# Patient Record
Sex: Female | Born: 1945
Health system: Southern US, Community
[De-identification: ages and names within clinical notes are randomized; demographics above are authoritative.]

## PROBLEM LIST (undated history)

## (undated) DIAGNOSIS — Z87442 Personal history of urinary calculi: Secondary | ICD-10-CM

## (undated) DIAGNOSIS — D649 Anemia, unspecified: Secondary | ICD-10-CM

## (undated) DIAGNOSIS — I739 Peripheral vascular disease, unspecified: Secondary | ICD-10-CM

## (undated) DIAGNOSIS — N189 Chronic kidney disease, unspecified: Secondary | ICD-10-CM

## (undated) DIAGNOSIS — M1990 Unspecified osteoarthritis, unspecified site: Secondary | ICD-10-CM

## (undated) DIAGNOSIS — E109 Type 1 diabetes mellitus without complications: Secondary | ICD-10-CM

## (undated) DIAGNOSIS — I10 Essential (primary) hypertension: Secondary | ICD-10-CM

## (undated) HISTORY — PX: ABDOMINAL HYSTERECTOMY: SHX81

## (undated) HISTORY — PX: HYSTERECTOMY ABDOMINAL WITH SALPINGO-OOPHORECTOMY: SHX6792

## (undated) HISTORY — PX: COLONOSCOPY: SHX174

## (undated) HISTORY — DX: Type 1 diabetes mellitus without complications: E10.9

---

## 2019-12-02 ENCOUNTER — Ambulatory Visit (INDEPENDENT_AMBULATORY_CARE_PROVIDER_SITE_OTHER): Payer: Medicare HMO | Admitting: Internal Medicine

## 2019-12-02 ENCOUNTER — Encounter: Payer: Self-pay | Admitting: Internal Medicine

## 2019-12-02 ENCOUNTER — Other Ambulatory Visit: Payer: Self-pay

## 2019-12-02 VITALS — BP 180/100 | HR 94 | Temp 97.3°F | Wt 155.2 lb

## 2019-12-02 DIAGNOSIS — N6315 Unspecified lump in the right breast, overlapping quadrants: Secondary | ICD-10-CM | POA: Diagnosis not present

## 2019-12-02 DIAGNOSIS — E109 Type 1 diabetes mellitus without complications: Secondary | ICD-10-CM | POA: Insufficient documentation

## 2019-12-02 DIAGNOSIS — R030 Elevated blood-pressure reading, without diagnosis of hypertension: Secondary | ICD-10-CM

## 2019-12-02 LAB — POCT GLYCOSYLATED HEMOGLOBIN (HGB A1C): Hemoglobin A1C: 13.4 % — AB (ref 4.0–5.6)

## 2019-12-02 MED ORDER — FREESTYLE LIBRE 14 DAY READER DEVI: 1.0000 | 2 refills | Status: DC

## 2019-12-02 NOTE — Progress Notes (Signed)
New Patient Office Visit     This visit occurred during the SARS-CoV-2 public health emergency.  Safety protocols were in place, including screening questions prior to the visit, additional usage of staff PPE, and extensive cleaning of exam room while observing appropriate contact time as indicated for disinfecting solutions.    CC/Reason for Visit: Establish care, discuss chronic conditions Previous PCP: [place] Last Visit: 7 months ago  HPI: Dana [name] is a 73 y.o. female who is coming in today for the above mentioned reasons. Past Medical History is significant for: Insulin-dependent diabetes on 40 units of Tresiba 20 units of NovoLog 3 times daily with meals.  She felt that her most recent A1c was about 7 months ago she has allergies to NSAIDs which cause swelling and clindamycin which causes rash.  Her past surgical history is a hysterectomy, she is a never smoker no alcohol, no family history very significant for diabetes with a mother and 4 siblings.  She wants me to look at her right breast lump that she has.  Past Medical/Surgical History: Past Medical History:  Diagnosis Date  . DM (diabetes mellitus), type 1 ([place])     Past Surgical History:  Procedure Laterality Date  . HYSTERECTOMY ABDOMINAL WITH SALPINGO-OOPHORECTOMY      Social History:  reports that she has never smoked. She has never used smokeless tobacco. She reports that she does not drink alcohol or use drugs.  Allergies: Allergies  Allergen Reactions  . Clindamycin/Lincomycin   . Motrin [Ibuprofen]     Family History:  Family History  Problem Relation Age of Onset  . Diabetes Mother      Current Outpatient Medications:  .  insulin aspart (NOVOLOG FLEXPEN) 100 UNIT/ML FlexPen, Inject into the skin 3 (three) times daily with meals., Disp: , Rfl:  .  Insulin Degludec (TRESIBA FLEXTOUCH) 200 UNIT/ML SOPN, Inject 60 Units into the skin daily., Disp: , Rfl:  .  Continuous Blood Gluc  Receiver (FREESTYLE LIBRE 14 DAY READER) DEVI, 1 Device by Does not apply route every 14 (fourteen) days., Disp: 12 each, Rfl: 2  Review of Systems:  Constitutional: Denies fever, chills, diaphoresis, appetite change and fatigue.  HEENT: Denies photophobia, eye pain, redness, hearing loss, ear pain, congestion, sore throat, rhinorrhea, sneezing, mouth sores, trouble swallowing, neck pain, neck stiffness and tinnitus.   Respiratory: Denies SOB, DOE, cough, chest tightness,  and wheezing.   Cardiovascular: Denies chest pain, palpitations and leg swelling.  Gastrointestinal: Denies nausea, vomiting, abdominal pain, diarrhea, constipation, blood in stool and abdominal distention.  Genitourinary: Denies dysuria, urgency, frequency, hematuria, flank pain and difficulty urinating.  Endocrine: Denies: hot or cold intolerance, sweats, changes in hair or nails, polyuria, polydipsia. Musculoskeletal: Denies myalgias, back pain, joint swelling, arthralgias and gait problem.  Skin: Denies pallor, rash and wound.  Neurological: Denies dizziness, seizures, syncope, weakness, light-headedness, numbness and headaches.  Hematological: Denies adenopathy. Easy bruising, personal or family bleeding history  Psychiatric/Behavioral: Denies suicidal ideation, mood changes, confusion, nervousness, sleep disturbance and agitation    Physical Exam: Vitals:   12/02/19 1416  BP: (!) 180/100  Pulse: 94  Temp: (!) 97.3 F (36.3 C)  TempSrc: Temporal  SpO2: 98%  Weight: 155 lb 3.2 oz (70.4 kg)   There is no height or weight on file to calculate BMI.  Constitutional: NAD, calm, comfortable Eyes: PERRL, lids and conjunctivae normal ENMT: Mucous membranes are moist.  Respiratory: clear to auscultation bilaterally, no wheezing, no crackles. Normal respiratory effort. No accessory  muscle use.  Cardiovascular: Regular rate and rhythm, no murmurs / rubs / gallops. No extremity edema. 2+ pedal pulses.  Abdomen: no  tenderness, no masses palpated. No hepatosplenomegaly. Bowel sounds positive.  Musculoskeletal: no clubbing / cyanosis. No joint deformity upper and lower extremities. Good ROM, no contractures. Normal muscle tone.  Skin: no rashes, lesions, ulcers. No induration Neurologic: Grossly intact and nonfocal Psychiatric: Normal judgment and insight. Alert and oriented x 3. Normal mood.    Impression and Plan:  Elevated BP without diagnosis of hypertension -Blood pressure in office today is 180/100, she does not have a history of hypertension. -Have advised ambulatory blood pressure monitoring and she will return in 8 weeks for follow-up.  Type 1 diabetes mellitus without complication (HCC)  -123456 today is 13.4. -We will increase Tresiba from 48 to 60 units, continue NovoLog 3 times daily with meals 20 units. -She will check CBGs 3 times a day before breakfast, before lunch, before dinner and will come back in 8 weeks for follow-up. -She knows what to do in case of hypoglycemic episodes. -She is requesting a prescription for a glucometer and a freestyle libre. -She needs ophthalmology and podiatry referrals placed.  Right breast lump -We will send for diagnostic mammogram.  She states it has been a few years since her last one.    Patient Instructions  -Nice seeing you today!!  -increase Tresiba to 60 units daily.  -Follow up in 8 weeks with blood pressure and CBG logs.       Lelon Frohlich, MD [facility]

## 2019-12-02 NOTE — Patient Instructions (Signed)
-  Nice seeing you today!!  -increase Tresiba to 60 units daily.  -Follow up in 8 weeks with blood pressure and CBG logs.

## 2019-12-02 NOTE — Addendum Note (Signed)
Addended by: Rebecca Eaton on: 12/02/2019 03:19 PM   Modules accepted: Orders

## 2019-12-09 ENCOUNTER — Encounter: Payer: Self-pay | Admitting: Podiatry

## 2019-12-09 ENCOUNTER — Ambulatory Visit: Payer: Medicare HMO | Admitting: Podiatry

## 2019-12-09 ENCOUNTER — Encounter (HOSPITAL_COMMUNITY): Payer: Self-pay | Admitting: Emergency Medicine

## 2019-12-09 ENCOUNTER — Ambulatory Visit (HOSPITAL_COMMUNITY)
Admission: EM | Admit: 2019-12-09 | Discharge: 2019-12-09 | Disposition: A | Discharge status: Other Acute Inpatient Hospital | Payer: Medicare HMO | Source: Home / Self Care

## 2019-12-09 ENCOUNTER — Other Ambulatory Visit: Payer: Self-pay

## 2019-12-09 ENCOUNTER — Emergency Department (HOSPITAL_COMMUNITY)
Admission: EM | Admit: 2019-12-09 | Discharge: 2019-12-09 | Disposition: A | Discharge status: Home / Self Care | Payer: Medicare HMO | Attending: Emergency Medicine | Admitting: Emergency Medicine

## 2019-12-09 ENCOUNTER — Encounter (HOSPITAL_COMMUNITY): Payer: Self-pay

## 2019-12-09 VITALS — BP 190/90 | HR 95

## 2019-12-09 DIAGNOSIS — R030 Elevated blood-pressure reading, without diagnosis of hypertension: Secondary | ICD-10-CM

## 2019-12-09 DIAGNOSIS — E1165 Type 2 diabetes mellitus with hyperglycemia: Secondary | ICD-10-CM

## 2019-12-09 DIAGNOSIS — M2041 Other hammer toe(s) (acquired), right foot: Secondary | ICD-10-CM | POA: Diagnosis not present

## 2019-12-09 DIAGNOSIS — M2042 Other hammer toe(s) (acquired), left foot: Secondary | ICD-10-CM

## 2019-12-09 DIAGNOSIS — M79675 Pain in left toe(s): Secondary | ICD-10-CM | POA: Diagnosis not present

## 2019-12-09 DIAGNOSIS — M79674 Pain in right toe(s): Secondary | ICD-10-CM | POA: Diagnosis not present

## 2019-12-09 DIAGNOSIS — Z5321 Procedure and treatment not carried out due to patient leaving prior to being seen by health care provider: Secondary | ICD-10-CM | POA: Diagnosis not present

## 2019-12-09 DIAGNOSIS — L84 Corns and callosities: Secondary | ICD-10-CM

## 2019-12-09 DIAGNOSIS — I10 Essential (primary) hypertension: Secondary | ICD-10-CM | POA: Diagnosis present

## 2019-12-09 DIAGNOSIS — B351 Tinea unguium: Secondary | ICD-10-CM | POA: Diagnosis not present

## 2019-12-09 DIAGNOSIS — R079 Chest pain, unspecified: Secondary | ICD-10-CM

## 2019-12-09 DIAGNOSIS — M542 Cervicalgia: Secondary | ICD-10-CM

## 2019-12-09 DIAGNOSIS — R0789 Other chest pain: Secondary | ICD-10-CM | POA: Diagnosis not present

## 2019-12-09 DIAGNOSIS — E1065 Type 1 diabetes mellitus with hyperglycemia: Secondary | ICD-10-CM

## 2019-12-09 LAB — BASIC METABOLIC PANEL
Anion gap: 10 (ref 5–15)
BUN: 27 mg/dL — ABNORMAL HIGH (ref 8–23)
CO2: 25 mmol/L (ref 22–32)
Calcium: 9.3 mg/dL (ref 8.9–10.3)
Chloride: 99 mmol/L (ref 98–111)
Creatinine, Ser: 1.04 mg/dL — ABNORMAL HIGH (ref 0.44–1.00)
GFR calc Af Amer: >60 mL/min (ref >60)
GFR calc non Af Amer: 53 mL/min — ABNORMAL LOW (ref >60)
Glucose, Bld: 334 mg/dL — ABNORMAL HIGH (ref 70–99)
Potassium: 4.3 mmol/L (ref 3.5–5.1)
Sodium: 134 mmol/L — ABNORMAL LOW (ref 135–145)

## 2019-12-09 LAB — CBC
HCT: 34.8 % — ABNORMAL LOW (ref 36.0–46.0)
Hemoglobin: 11.6 g/dL — ABNORMAL LOW (ref 12.0–15.0)
MCH: 30.1 pg (ref 26.0–34.0)
MCHC: 33.3 g/dL (ref 30.0–36.0)
MCV: 90.4 fL (ref 80.0–100.0)
Platelets: 241 10*3/uL (ref 150–400)
RBC: 3.85 MIL/uL — ABNORMAL LOW (ref 3.87–5.11)
RDW: 12.3 % (ref 11.5–15.5)
WBC: 6.2 10*3/uL (ref 4.0–10.5)
nRBC: 0 % (ref 0.0–0.2)

## 2019-12-09 LAB — CBG MONITORING, ED: Glucose-Capillary: 295 mg/dL — ABNORMAL HIGH (ref 70–99)

## 2019-12-09 LAB — TROPONIN I (HIGH SENSITIVITY): Troponin I (High Sensitivity): 8 ng/L (ref <18)

## 2019-12-09 NOTE — ED Triage Notes (Signed)
Patient presents to Urgent Care with complaints of htn since several weeks ago. Patient reports she went to the foot doctor earlier today and had the following readings:  203/115, 190/95, 162/97 Patient states she has been having pain in her left "carotid artery" area.

## 2019-12-09 NOTE — Discharge Instructions (Signed)
Our nursing staff will transport you to the ER immediately to make sure you are not having a heart event especially since you have uncontrolled diabetes.

## 2019-12-09 NOTE — ED Notes (Signed)
Patient is being discharged from the [facility] and sent to the Emergency Department via wheelchair by staff. Per Provider Jaynee Eagles, patient is stable but in need of higher level of care due to hypertension/cardiac. Patient is aware and verbalizes understanding of plan of care.   Vitals:   12/09/19 1511  BP: (!) 165/94  Pulse: 81  Resp: 16  Temp: 98.3 F (36.8 C)  SpO2: 98%

## 2019-12-09 NOTE — ED Notes (Signed)
Pt stated she is leaving, will come back if she needs to.

## 2019-12-09 NOTE — ED Provider Notes (Signed)
[facility]   MRN: [id number] DOB: 1946/01/14  Subjective:   Dana [name] is a 73 y.o. female presenting for 1 week hx of acute onset worsening moderate intermittent (as bad as 8/10), sharp stabbing pain. There is not active chest pain in the exam room but she does state that she had the chest pain radiating into the left side of her neck.  She was not her podiatrist earlier today and had blood pressure readings into the 123456 systolic was advised to go to the emergency room.  Patient came here however.  Patient also has uncontrolled diabetes, last A1c was greater than 13% this month.  No current facility-administered medications for this encounter.  Current Outpatient Medications:  .  Continuous Blood Gluc Receiver (FREESTYLE LIBRE 14 DAY READER) DEVI, 1 Device by Does not apply route every 14 (fourteen) days., Disp: 12 each, Rfl: 2 .  insulin aspart (NOVOLOG FLEXPEN) 100 UNIT/ML FlexPen, Inject into the skin 3 (three) times daily with meals., Disp: , Rfl:  .  Insulin Degludec (TRESIBA FLEXTOUCH) 200 UNIT/ML SOPN, Inject 60 Units into the skin daily., Disp: , Rfl:    Allergies  Allergen Reactions  . Clindamycin/Lincomycin   . Motrin [Ibuprofen]     Past Medical History:  Diagnosis Date  . DM (diabetes mellitus), type 1 ([place])      Past Surgical History:  Procedure Laterality Date  . HYSTERECTOMY ABDOMINAL WITH SALPINGO-OOPHORECTOMY      Family History  Problem Relation Age of Onset  . Diabetes Mother   . Hypertension Father   . Diabetes Sister   . Diabetes Brother   . Diabetes Sister   . Hypertension Sister   . Diabetes Brother   . Diabetes Brother     Social History   Tobacco Use  . Smoking status: Never Smoker  . Smokeless tobacco: Never Used  Substance Use Topics  . Alcohol use: Never  . Drug use: Never    Review of Systems  Constitutional: Positive for malaise/fatigue. Negative for fever.  HENT: Negative for congestion, ear pain, sinus pain and  sore throat.   Eyes: Negative for discharge and redness.  Respiratory: Negative for cough, hemoptysis, shortness of breath and wheezing.   Cardiovascular: Positive for chest pain.  Gastrointestinal: Negative for abdominal pain, diarrhea, nausea and vomiting.  Genitourinary: Negative for dysuria, flank pain and hematuria.  Musculoskeletal: Positive for neck pain. Negative for myalgias.  Skin: Negative for rash.  Neurological: Negative for dizziness, weakness and headaches.  Psychiatric/Behavioral: Negative for depression and substance abuse.     Objective:   Vitals: BP (!) 165/94 (BP Location: Left Arm)   Pulse 81   Temp 98.3 F (36.8 C) (Oral)   Resp 16   SpO2 98%   Physical Exam Constitutional:      General: She is not in acute distress.    Appearance: Normal appearance. She is well-developed. She is not ill-appearing, toxic-appearing or diaphoretic.  HENT:     Head: Normocephalic and atraumatic.     Nose: Nose normal.     Mouth/Throat:     Mouth: Mucous membranes are moist.  Eyes:     Extraocular Movements: Extraocular movements intact.     Pupils: Pupils are equal, round, and reactive to light.  Neck:     Vascular: No carotid bruit.  Cardiovascular:     Rate and Rhythm: Normal rate and regular rhythm.     Pulses: Normal pulses.     Heart sounds: Normal heart sounds. No murmur. No  friction rub. No gallop.   Pulmonary:     Effort: Pulmonary effort is normal. No respiratory distress.     Breath sounds: Normal breath sounds. No stridor. No wheezing, rhonchi or rales.  Abdominal:     General: Bowel sounds are normal. There is no distension.     Palpations: Abdomen is soft.     Tenderness: There is no abdominal tenderness. There is no guarding or rebound.  Musculoskeletal:     Cervical back: Normal range of motion and neck supple. No rigidity or tenderness.  Lymphadenopathy:     Cervical: No cervical adenopathy.  Skin:    General: Skin is warm and dry.     Findings:  No rash.  Neurological:     Mental Status: She is alert and oriented to person, place, and time.     Cranial Nerves: No cranial nerve deficit.     Motor: No weakness.     Deep Tendon Reflexes: Reflexes normal.     Comments: Negative Romberg and pronator drift.  Psychiatric:        Mood and Affect: Mood normal.        Behavior: Behavior normal.        Thought Content: Thought content normal.        Judgment: Judgment normal.     ED ECG REPORT   Date: 12/09/2019  Rate: 78  Rhythm: normal sinus rhythm  QRS Axis: normal  Intervals: normal  ST/T Wave abnormalities: nonspecific T wave changes  Conduction Disutrbances:none  Narrative Interpretation: Nonspecific T wave flattening in lead III.  No previous EKG available for comparison.  Sinus rhythm at 78 bpm.  Old EKG Reviewed: none available  I have personally reviewed the EKG tracing and agree with the computerized printout as noted.   Assessment and Plan :   1. Atypical chest pain   2. Right-sided chest pain   3. Neck pain on left side   4. Elevated blood pressure reading   5. Uncontrolled type 2 diabetes mellitus with hyperglycemia ([place])     Patient is high risk for heart event given her uncontrolled diabetes.  She also has very high risk symptoms and having sharp stabbing chest pain that radiates to the left side of her neck.  Her blood pressure is very high today, denies history of hypertension or having need for taking blood pressure medications previously.  There is no previous EKG for comparison.  Given patient's uncontrolled diabetes despite lack of active chest pain in the exam room, recommend that she report to the emergency room for rule out of ACS.  Patient was agreeable to this, our nursing staff will transport her there immediately.   Jaynee Eagles, PA-C 12/09/19 1624

## 2019-12-09 NOTE — ED Notes (Signed)
Pt stated having pain on left arm, that just start on the time that she is sitting in the lobby

## 2019-12-09 NOTE — Patient Instructions (Signed)
Diabetes Mellitus and Foot Care Foot care is an important part of your health, especially when you have diabetes. Diabetes may cause you to have problems because of poor blood flow (circulation) to your feet and legs, which can cause your skin to:  Become thinner and drier.  Break more easily.  Heal more slowly.  Peel and crack. You may also have nerve damage (neuropathy) in your legs and feet, causing decreased feeling in them. This means that you may not notice minor injuries to your feet that could lead to more serious problems. Noticing and addressing any potential problems early is the best way to prevent future foot problems. How to care for your feet Foot hygiene  Wash your feet daily with warm water and mild soap. Do not use hot water. Then, pat your feet and the areas between your toes until they are completely dry. Do not soak your feet as this can dry your skin.  Trim your toenails straight across. Do not dig under them or around the cuticle. File the edges of your nails with an emery board or nail file.  Apply a moisturizing lotion or petroleum jelly to the skin on your feet and to dry, brittle toenails. Use lotion that does not contain alcohol and is unscented. Do not apply lotion between your toes. Shoes and socks  Wear clean socks or stockings every day. Make sure they are not too tight. Do not wear knee-high stockings since they may decrease blood flow to your legs.  Wear shoes that fit properly and have enough cushioning. Always look in your shoes before you put them on to be sure there are no objects inside.  To break in new shoes, wear them for just a few hours a day. This prevents injuries on your feet. Wounds, scrapes, corns, and calluses  Check your feet daily for blisters, cuts, bruises, sores, and redness. If you cannot see the bottom of your feet, use a mirror or ask someone for help.  Do not cut corns or calluses or try to remove them with medicine.  If you  find a minor scrape, cut, or break in the skin on your feet, keep it and the skin around it clean and dry. You may clean these areas with mild soap and water. Do not clean the area with peroxide, alcohol, or iodine.  If you have a wound, scrape, corn, or callus on your foot, look at it several times a day to make sure it is healing and not infected. Check for: ? Redness, swelling, or pain. ? Fluid or blood. ? Warmth. ? Pus or a bad smell. General instructions  Do not cross your legs. This may decrease blood flow to your feet.  Do not use heating pads or hot water bottles on your feet. They may burn your skin. If you have lost feeling in your feet or legs, you may not know this is happening until it is too late.  Protect your feet from hot and cold by wearing shoes, such as at the beach or on hot pavement.  Schedule a complete foot exam at least once a year (annually) or more often if you have foot problems. If you have foot problems, report any cuts, sores, or bruises to your health care provider immediately. Contact a health care provider if:  You have a medical condition that increases your risk of infection and you have any cuts, sores, or bruises on your feet.  You have an injury that is not   healing.  You have redness on your legs or feet.  You feel burning or tingling in your legs or feet.  You have pain or cramps in your legs and feet.  Your legs or feet are numb.  Your feet always feel cold.  You have pain around a toenail. Get help right away if:  You have a wound, scrape, corn, or callus on your foot and: ? You have pain, swelling, or redness that gets worse. ? You have fluid or blood coming from the wound, scrape, corn, or callus. ? Your wound, scrape, corn, or callus feels warm to the touch. ? You have pus or a bad smell coming from the wound, scrape, corn, or callus. ? You have a fever. ? You have a red line going up your leg. Summary  Check your feet every day  for cuts, sores, red spots, swelling, and blisters.  Moisturize feet and legs daily.  Wear shoes that fit properly and have enough cushioning.  If you have foot problems, report any cuts, sores, or bruises to your health care provider immediately.  Schedule a complete foot exam at least once a year (annually) or more often if you have foot problems. This information is not intended to replace advice given to you by your health care provider. Make sure you discuss any questions you have with your health care provider. Document Released: 12/05/2000 Document Revised: 01/20/2018 Document Reviewed: 01/09/2017 Elsevier Patient Education  2020 [employer]  

## 2019-12-09 NOTE — ED Triage Notes (Signed)
Patient sent down from UC due to elevated blood pressure. Patient went to [facility] earlier today for her diabetic care but was told she could not go home due to BP of 203/115. Patient c/o pain in her left "carotid artery" area along with upper right sided pain, states it is not her chest but points to her chest. Denies any pain at this time.

## 2019-12-10 NOTE — Progress Notes (Signed)
Subjective: Dana [name] presents today referred by Dana [name], Dana Halsted, MD for diabetic foot evaluation.  Patient denies any history of foot wounds.  Patient denies any history of numbness, tingling, burning, pins/needles sensations.  Today, patient c/o of painful, discolored, thick toenails which interfere with daily activities.  Pain is aggravated when wearing enclosed shoe gear.   Mrs. Dana [name] has elevated blood pressure readings during intake and relates she has not been diagnosed with HTN and has not taken any medications. She states she was going to purchase homeopathic medication to bring it down. She also relates one of her brother has had 8 strokes. She denies any acute chest pain in office today. Denies any headache, lightheadedness or dizziness.   Past Medical History:  Diagnosis Date  . DM (diabetes mellitus), type 1 [facility])     Patient Active Problem List   Diagnosis Date Noted  . DM (diabetes mellitus), type 1 ([place])     Past Surgical History:  Procedure Laterality Date  . HYSTERECTOMY ABDOMINAL WITH SALPINGO-OOPHORECTOMY      Current Outpatient Medications on File Prior to Visit  Medication Sig Dispense Refill  . Continuous Blood Gluc Receiver (FREESTYLE LIBRE 14 DAY READER) DEVI 1 Device by Does not apply route every 14 (fourteen) days. 12 each 2  . insulin aspart (NOVOLOG FLEXPEN) 100 UNIT/ML FlexPen Inject into the skin 3 (three) times daily with meals.    . Insulin Degludec (TRESIBA FLEXTOUCH) 200 UNIT/ML SOPN Inject 60 Units into the skin daily.     No current facility-administered medications on file prior to visit.     Allergies  Allergen Reactions  . Clindamycin/Lincomycin   . Motrin [Ibuprofen]     Social History   Occupational History  . Not on file  Tobacco Use  . Smoking status: Never Smoker  . Smokeless tobacco: Never Used  Substance and Sexual Activity  . Alcohol use: Never  . Drug use: Never  . Sexual activity: Not on file     Family History  Problem Relation Age of Onset  . Diabetes Mother   . Hypertension Father   . Diabetes Sister   . Diabetes Brother   . Diabetes Sister   . Hypertension Sister   . Diabetes Brother   . Diabetes Brother     There is no immunization history on file for this patient.  Review of systems: Positive Findings in bold print.  Constitutional:  chills, fatigue, fever, sweats, weight change Communication: Optometrist, sign Ecologist, hand writing, iPad/Android device Head: headaches, head injury Eyes: changes in vision, eye pain, glaucoma, cataracts, macular degeneration, diplopia, glare,  light sensitivity, eyeglasses or contacts, blindness Ears nose mouth throat: hearing impaired, hearing aids,  ringing in ears, deaf, sign language,  vertigo, nosebleeds,  rhinitis,  cold sores, snoring, swollen glands Cardiovascular: HTN, edema, arrhythmia, pacemaker in place, defibrillator in place, chest pain/tightness, chronic anticoagulation, neck discomfort, blood clot, heart failure, MI Peripheral Vascular: leg cramps, varicose veins, blood clots, lymphedema, varicosities Respiratory:  difficulty breathing, denies congestion, SOB, wheezing, cough, emphysema Gastrointestinal: change in appetite or weight, abdominal pain, constipation, diarrhea, nausea, vomiting, vomiting blood, change in bowel habits, abdominal pain, jaundice, rectal bleeding, hemorrhoids, GERD Genitourinary:  nocturia,  pain on urination, polyuria,  blood in urine, Foley catheter, urinary urgency, ESRD on hemodialysis Musculoskeletal: amputation, cramping, stiff joints, painful joints, decreased joint motion, fractures, OA, gout, hemiplegia, paraplegia, uses cane, wheelchair bound, uses walker, uses rollator Skin: +changes in toenails, color change, dryness, itching, mole changes,  rash,  wound(s) Neurological: headaches, numbness in feet, paresthesias in feet, burning in feet, fainting,  seizures, change in  speech,  headaches, memory problems/poor historian, cerebral palsy, weakness, paralysis, CVA, TIA Endocrine: diabetes, hypothyroidism, hyperthyroidism,  goiter, dry mouth, flushing, heat intolerance,  cold intolerance,  excessive thirst, denies polyuria,  nocturia Hematological:  easy bleeding, excessive bleeding, easy bruising, enlarged lymph nodes, on long term blood thinner, history of past transusions Allergy/immunological:  hives, eczema, frequent infections, multiple drug allergies, seasonal allergies, transplant recipient, multiple food allergies Psychiatric:  anxiety, depression, mood disorder, suicidal ideations, hallucinations, insomnia  Objective: 73 y.o. AAF, WD, WN in NAD. Verbal, cooperative. AAO x 3.  Vitals:   12/09/19 1319  BP: (!) 190/90  Pulse: 95   Vascular Examination: Capillary refill time immediate x 10 digits  Dorsalis pedis pulses palpable b/l.  Posterior tibial pulses palpable b/l.  Digital hair sparse x 10 digits.  Skin temperature gradient WNL b/l.  Dermatological Examination: Skin with normal turgor, texture and tone b/l.  Toenails 1-5 b/l discolored, thick, dystrophic with subungual debris and pain with palpation to nailbeds due to thickness of nails.  Hyperkeratotic lesion dorsal 5th PIPJ b/l and submet head 5 b/l. No erythema, no edema, no drainage, no flocculence noted.  Musculoskeletal: Muscle strength 5/5 to all LE muscle groups b/l.  Hammertoe 5th digit b/l  No pain, crepitus or joint limitation with active/passive ROM b/l.  Neurological: Sensation intact 5/5 b/l with 10 gram monofilament.  Vibratory sensation intact b/l.   Assessment: 1. Painful onychomycosis toenails 1-5 b/l  2. Elevated BP without dx of HTN 3. Corns b/l 5th digit 4. Calluses submet head 5 b/l 5. NIDDM  Plan: 1. Discussed concern of her elevated blood pressure readings. Informed her her bp readings were in stroke range. She is not in any acute distress in  office. Recommended ED visit. She agreed to go to urgent care after her visit here.  2. Discussed diabetic foot care principles. Literature dispensed on today. 3. Toenails 1-5 b/l were debrided in length and girth without iatrogenic bleeding. 4. Patient to continue soft, supportive shoe gear 5. Patient to report any pedal injuries to medical professional immediately. 6. Follow up 3 months.  7. Patient/POA to call should there be a concern in the interim.

## 2019-12-18 ENCOUNTER — Other Ambulatory Visit: Payer: Self-pay

## 2019-12-18 ENCOUNTER — Encounter (HOSPITAL_COMMUNITY): Payer: Self-pay | Admitting: Emergency Medicine

## 2019-12-18 ENCOUNTER — Emergency Department (HOSPITAL_COMMUNITY): Payer: Medicare HMO

## 2019-12-18 ENCOUNTER — Emergency Department (HOSPITAL_COMMUNITY)
Admission: EM | Admit: 2019-12-18 | Discharge: 2019-12-18 | Disposition: A | Discharge status: Home / Self Care | Payer: Medicare HMO | Attending: Emergency Medicine | Admitting: Emergency Medicine

## 2019-12-18 DIAGNOSIS — R0789 Other chest pain: Secondary | ICD-10-CM | POA: Diagnosis present

## 2019-12-18 DIAGNOSIS — Z794 Long term (current) use of insulin: Secondary | ICD-10-CM | POA: Diagnosis not present

## 2019-12-18 DIAGNOSIS — E1165 Type 2 diabetes mellitus with hyperglycemia: Secondary | ICD-10-CM | POA: Insufficient documentation

## 2019-12-18 DIAGNOSIS — I10 Essential (primary) hypertension: Secondary | ICD-10-CM | POA: Diagnosis not present

## 2019-12-18 DIAGNOSIS — E119 Type 2 diabetes mellitus without complications: Secondary | ICD-10-CM

## 2019-12-18 DIAGNOSIS — R739 Hyperglycemia, unspecified: Secondary | ICD-10-CM

## 2019-12-18 HISTORY — DX: Essential (primary) hypertension: I10

## 2019-12-18 LAB — BASIC METABOLIC PANEL
Anion gap: 6 (ref 5–15)
BUN: 18 mg/dL (ref 8–23)
CO2: 26 mmol/L (ref 22–32)
Calcium: 9 mg/dL (ref 8.9–10.3)
Chloride: 102 mmol/L (ref 98–111)
Creatinine, Ser: 1.22 mg/dL — ABNORMAL HIGH (ref 0.44–1.00)
GFR calc Af Amer: 51 mL/min — ABNORMAL LOW (ref >60)
GFR calc non Af Amer: 44 mL/min — ABNORMAL LOW (ref >60)
Glucose, Bld: 346 mg/dL — ABNORMAL HIGH (ref 70–99)
Potassium: 4.2 mmol/L (ref 3.5–5.1)
Sodium: 134 mmol/L — ABNORMAL LOW (ref 135–145)

## 2019-12-18 LAB — CBC
HCT: 33.7 % — ABNORMAL LOW (ref 36.0–46.0)
Hemoglobin: 10.9 g/dL — ABNORMAL LOW (ref 12.0–15.0)
MCH: 29.6 pg (ref 26.0–34.0)
MCHC: 32.3 g/dL (ref 30.0–36.0)
MCV: 91.6 fL (ref 80.0–100.0)
Platelets: 217 10*3/uL (ref 150–400)
RBC: 3.68 MIL/uL — ABNORMAL LOW (ref 3.87–5.11)
RDW: 12.2 % (ref 11.5–15.5)
WBC: 6 10*3/uL (ref 4.0–10.5)
nRBC: 0 % (ref 0.0–0.2)

## 2019-12-18 LAB — TROPONIN I (HIGH SENSITIVITY): Troponin I (High Sensitivity): 8 ng/L (ref <18)

## 2019-12-18 MED ORDER — SODIUM CHLORIDE 0.9% FLUSH: 3.0000 mL | Freq: Once | INTRAVENOUS | Status: DC

## 2019-12-18 NOTE — Discharge Instructions (Signed)
Please schedule follow-up appointment with your primary doctor to discuss your high blood pressure, diabetes management.  If you develop any chest pain, difficulty in breathing, abdominal pain, vomiting, other new concerning symptom recommend return to ER for reassessment.

## 2019-12-18 NOTE — ED Triage Notes (Signed)
Pt her by EMS for eval of intermittent chest pain for 1 week and uncontrolled hypertension. Bps have been 123456 systolic for the past week. Pt had right sided chest pain earlier today, she took 324 of Asprin. She has no pain currently.

## 2019-12-18 NOTE — ED Provider Notes (Signed)
[facility] EMERGENCY DEPARTMENT Provider Note   CSN: [id number] Arrival date & time: 12/18/19  1609     History Chief Complaint  Patient presents with  . Chest Pain  . Hypertension    Dana [name] is a 73 y.o. female. Presents to the emergency department with chief complaint of chest discomfort, concern for elevated blood sugar elevated blood pressure. Patient states that she is been having symptoms for at least the past week, chest pain described as a stabbing sensation, brief, lasting for seconds, occurs at rest, no association with exertion, no association with shortness of breath. No cough, no fever. No known history of coronary artery disease. Patient has history of diabetes. States that she is followed by her primary care doctor, on insulin regimen. States that her sugars have been running high lately, 300s to 400s. No nausea, vomiting, abdominal pain. Also states that she has had elevated blood pressures lately, states she used to not have any blood pressure problems. Was noted to be somewhat elevated in her last office visit but was not started on any antihypertensives. Denies any headaches, no numbness, weakness, vision changes, speech changes or ambulation changes.  HPI     Past Medical History:  Diagnosis Date  . DM (diabetes mellitus), type 1 ([place])   . Hypertension     Patient Active Problem List   Diagnosis Date Noted  . DM (diabetes mellitus), type 1 ([place])     Past Surgical History:  Procedure Laterality Date  . HYSTERECTOMY ABDOMINAL WITH SALPINGO-OOPHORECTOMY       OB History   No obstetric history on file.     Family History  Problem Relation Age of Onset  . Diabetes Mother   . Hypertension Father   . Diabetes Sister   . Diabetes Brother   . Diabetes Sister   . Hypertension Sister   . Diabetes Brother   . Diabetes Brother     Social History   Tobacco Use  . Smoking status: Never Smoker  . Smokeless tobacco: Never Used   Substance Use Topics  . Alcohol use: Never  . Drug use: Never    Home Medications Prior to Admission medications   Medication Sig Start Date End Date Taking? Authorizing Provider  Continuous Blood Gluc Receiver (FREESTYLE LIBRE 14 DAY READER) DEVI 1 Device by Does not apply route every 14 (fourteen) days. 12/02/19   Isaac Bliss, Rayford Halsted, MD  insulin aspart (NOVOLOG FLEXPEN) 100 UNIT/ML FlexPen Inject into the skin 3 (three) times daily with meals.    [provider]  Insulin Degludec (TRESIBA FLEXTOUCH) 200 UNIT/ML SOPN Inject 60 Units into the skin daily.    [provider]    Allergies    Clindamycin/lincomycin and Motrin [ibuprofen]  Review of Systems   Review of Systems  Constitutional: Negative for chills and fever.  HENT: Negative for ear pain and sore throat.   Eyes: Negative for pain and visual disturbance.  Respiratory: Negative for cough and shortness of breath.   Cardiovascular: Positive for chest pain. Negative for palpitations.  Gastrointestinal: Negative for abdominal pain and vomiting.  Genitourinary: Negative for dysuria and hematuria.  Musculoskeletal: Negative for arthralgias and back pain.  Skin: Negative for color change and rash.  Neurological: Negative for seizures and syncope.  All other systems reviewed and are negative.   Physical Exam Updated Vital Signs BP (!) 175/91 (BP Location: Right Arm)   Pulse 78   Temp 98.7 F (37.1 C) (Oral)   Resp  16   SpO2 98%   Physical Exam Vitals and nursing note reviewed.  Constitutional:      General: She is not in acute distress.    Appearance: She is well-developed.  HENT:     Head: Normocephalic and atraumatic.  Eyes:     Conjunctiva/sclera: Conjunctivae normal.  Cardiovascular:     Rate and Rhythm: Normal rate and regular rhythm.     Heart sounds: No murmur.  Pulmonary:     Effort: Pulmonary effort is normal. No respiratory distress.     Breath sounds: Normal breath sounds.   Chest:     Chest wall: No mass or deformity.  Abdominal:     Palpations: Abdomen is soft.     Tenderness: There is no abdominal tenderness.  Musculoskeletal:     Cervical back: Neck supple.     Right lower leg: No edema.     Left lower leg: No edema.  Skin:    General: Skin is warm and dry.  Neurological:     Mental Status: She is alert.     ED Results / Procedures / Treatments   Labs (all labs ordered are listed, but only abnormal results are displayed) Labs Reviewed  BASIC METABOLIC PANEL - Abnormal; Notable for the following components:      Result Value   Sodium 134 (*)    Glucose, Bld 346 (*)    Creatinine, Ser 1.22 (*)    GFR calc non Af Amer 44 (*)    GFR calc Af Amer 51 (*)    All other components within normal limits  CBC - Abnormal; Notable for the following components:   RBC 3.68 (*)    Hemoglobin 10.9 (*)    HCT 33.7 (*)    All other components within normal limits  TROPONIN I (HIGH SENSITIVITY)  TROPONIN I (HIGH SENSITIVITY)    EKG EKG Interpretation  Date/Time:  Sunday December 18 2019 16:41:09 EST Ventricular Rate:  75 PR Interval:  158 QRS Duration: 82 QT Interval:  380 QTC Calculation: 424 R Axis:   -15 Text Interpretation: Normal sinus rhythm Moderate voltage criteria for LVH, may be normal variant ( R in aVL , Cornell product ) Nonspecific ST and T wave abnormality Abnormal ECG Confirmed by Juanette Urizar ([id number]) on 12/18/2019 6:40:02 PM   Radiology DG Chest 2 View  Result Date: 12/18/2019 CLINICAL DATA:  73 year old female with chest pain. EXAM: CHEST - 2 VIEW COMPARISON:  None FINDINGS: The lungs are clear. There is no pleural effusion or pneumothorax. Top-normal cardiac size. No acute osseous pathology. IMPRESSION: No active cardiopulmonary disease. Electronically Signed   By: Arash  Radparvar M.D.   On: 12/18/2019 17:26    Procedures Procedures (including critical care time)  Medications Ordered in ED Medications  sodium  chloride flush (NS) 0.9 % injection 3 mL (has no administration in time range)    ED Course  I have reviewed the triage vital signs and the nursing notes.  Pertinent labs & imaging results that were available during my care of the patient were reviewed by me and considered in my medical decision making (see chart for details).    MDM Rules/Calculators/A&P                      73  year old lady past medical history of type 2 diabetes on insulin presents to ER with multiple complaints. Regarding chest pain, she has no ongoing symptoms here, pain described as brief, fleeting sensation, no association with  exertion, her EKG has no acute ischemic changes, troponin within normal limits, very low suspicion for ACS. CXR negative. No hypoxia, no tachypnea, no shortness of breath, doubt PE. Regarding her hyperglycemia, she has known history of type 2 diabetes, noted prior documentation of A1c of 13, she is not in DKA today. Recommend close follow-up with her primary doctor for her insulin management, will defer any changes in her insulin regimen to her primary doctor. Regarding hypertension, no symptoms from this, she is well-appearing, blood pressure without any treatment is 156/86. Again, will defer to her primary doctor for management of her hypertension. Reviewed return precautions in detail with patient, at this time believe she is appropriate for discharge and outpatient management.    After the discussed management above, the patient was determined to be safe for discharge.  The patient was in agreement with this plan and all questions regarding their care were answered.  ED return precautions were discussed and the patient will return to the ED with any significant worsening of condition.   Final Clinical Impression(s) / ED Diagnoses Final diagnoses:  Hypertension, unspecified type  Hyperglycemia  Type 2 diabetes mellitus without complication, with long-term current use of insulin [facility])    Rx  / DC Orders ED Discharge Orders    None       Lucrezia Starch, MD 12/18/19 2102

## 2019-12-18 NOTE — ED Notes (Signed)
Patient Alert and oriented to baseline. Stable and ambulatory to baseline. Patient verbalized understanding of the discharge instructions.  Patient belongings were taken by the patient.   

## 2020-01-12 ENCOUNTER — Telehealth: Payer: Self-pay | Admitting: Internal Medicine

## 2020-01-12 NOTE — Telephone Encounter (Signed)
Attempted to call patient but unable to leave a message.   How many units is patient using?

## 2020-01-12 NOTE — Telephone Encounter (Signed)
MEDICATION: insulin aspart (NOVOLOG FLEXPEN) 100 UNIT/ML FlexPen Insulin Degludec (TRESIBA FLEXTOUCH) 200 UNIT/ML SOPN  PHARMACY: [id number] - [place], [place] - [address] AT [place] Phone:  [phone]  Fax:  [phone]

## 2020-01-13 MED ORDER — NOVOLOG FLEXPEN 100 UNIT/ML ~~LOC~~ SOPN: 25.0000 [IU] | PEN_INJECTOR | Freq: Three times a day (TID) | SUBCUTANEOUS | 0 refills | Status: DC

## 2020-01-13 MED ORDER — TRESIBA FLEXTOUCH 200 UNIT/ML ~~LOC~~ SOPN: 60.0000 [IU] | PEN_INJECTOR | Freq: Every day | SUBCUTANEOUS | 0 refills | Status: DC

## 2020-01-13 NOTE — Addendum Note (Signed)
Addended by: Westley Hummer B on: 01/13/2020 03:23 PM   Modules accepted: Orders

## 2020-01-13 NOTE — Telephone Encounter (Signed)
Spoke with patient and refill sent.  Patient also requested an appointment for hypertension.  Appointment scheduled

## 2020-01-17 ENCOUNTER — Telehealth: Payer: Self-pay | Admitting: Internal Medicine

## 2020-01-17 ENCOUNTER — Other Ambulatory Visit: Payer: Self-pay

## 2020-01-18 ENCOUNTER — Encounter: Payer: Self-pay | Admitting: Internal Medicine

## 2020-01-18 ENCOUNTER — Ambulatory Visit (INDEPENDENT_AMBULATORY_CARE_PROVIDER_SITE_OTHER): Payer: Medicare HMO | Admitting: Internal Medicine

## 2020-01-18 VITALS — BP 190/100 | HR 91 | Temp 97.1°F | Wt 151.5 lb

## 2020-01-18 DIAGNOSIS — I10 Essential (primary) hypertension: Secondary | ICD-10-CM | POA: Diagnosis not present

## 2020-01-18 DIAGNOSIS — E1159 Type 2 diabetes mellitus with other circulatory complications: Secondary | ICD-10-CM

## 2020-01-18 MED ORDER — LISINOPRIL 10 MG PO TABS: 10.0000 mg | ORAL_TABLET | Freq: Every day | ORAL | 1 refills | Status: DC

## 2020-01-18 MED ORDER — HYDROCHLOROTHIAZIDE 25 MG PO TABS: 25.0000 mg | ORAL_TABLET | Freq: Every day | ORAL | 1 refills | Status: DC

## 2020-01-18 NOTE — Progress Notes (Signed)
Established Patient Office Visit     This visit occurred during the SARS-CoV-2 public health emergency.  Safety protocols were in place, including screening questions prior to the visit, additional usage of staff PPE, and extensive cleaning of exam room while observing appropriate contact time as indicated for disinfecting solutions.    CC/Reason for Visit: Blood pressure follow-up  HPI: Dana [name] is a 74 y.o. female who is coming in today for the above mentioned reasons. Past Medical History is significant for: Insulin-dependent diabetes.  At her visit in December she was noted to have elevated blood pressure but has never been diagnosed with hypertension.  I asked her to do ambulatory blood pressure monitoring and return today for follow-up.  Since I last saw her she visited the urgent care, had elevated blood pressures there.  She also comes in with a note from her podiatrist where she was noted to have blood pressure in the 123XX123 systolic range.  She denies chest pain, shortness of breath, headache, blurry or double vision, focal neurologic deficit.   Past Medical/Surgical History: Past Medical History:  Diagnosis Date  . DM (diabetes mellitus), type 1 ([place])   . Hypertension     Past Surgical History:  Procedure Laterality Date  . HYSTERECTOMY ABDOMINAL WITH SALPINGO-OOPHORECTOMY      Social History:  reports that she has never smoked. She has never used smokeless tobacco. She reports that she does not drink alcohol or use drugs.  Allergies: Allergies  Allergen Reactions  . Clindamycin/Lincomycin   . Motrin [Ibuprofen]     Family History:  Family History  Problem Relation Age of Onset  . Diabetes Mother   . Hypertension Father   . Diabetes Sister   . Diabetes Brother   . Diabetes Sister   . Hypertension Sister   . Diabetes Brother   . Diabetes Brother      Current Outpatient Medications:  .  Continuous Blood Gluc Receiver (FREESTYLE LIBRE 14 DAY  READER) DEVI, 1 Device by Does not apply route every 14 (fourteen) days., Disp: 12 each, Rfl: 2 .  insulin aspart (NOVOLOG FLEXPEN) 100 UNIT/ML FlexPen, Inject 25 Units into the skin 3 (three) times daily with meals., Disp: 15 mL, Rfl: 0 .  Insulin Degludec (TRESIBA FLEXTOUCH) 200 UNIT/ML SOPN, Inject 60 Units into the skin daily., Disp: 15 mL, Rfl: 0 .  hydrochlorothiazide (HYDRODIURIL) 25 MG tablet, Take 1 tablet (25 mg total) by mouth daily., Disp: 90 tablet, Rfl: 1 .  lisinopril (ZESTRIL) 10 MG tablet, Take 1 tablet (10 mg total) by mouth daily., Disp: 90 tablet, Rfl: 1  Review of Systems:  Constitutional: Denies fever, chills, diaphoresis, appetite change and fatigue.  HEENT: Denies photophobia, eye pain, redness, hearing loss, ear pain, congestion, sore throat, rhinorrhea, sneezing, mouth sores, trouble swallowing, neck pain, neck stiffness and tinnitus.   Respiratory: Denies SOB, DOE, cough, chest tightness,  and wheezing.   Cardiovascular: Denies chest pain, palpitations and leg swelling.  Gastrointestinal: Denies nausea, vomiting, abdominal pain, diarrhea, constipation, blood in stool and abdominal distention.  Genitourinary: Denies dysuria, urgency, frequency, hematuria, flank pain and difficulty urinating.  Endocrine: Denies: hot or cold intolerance, sweats, changes in hair or nails, polyuria, polydipsia. Musculoskeletal: Denies myalgias, back pain, joint swelling, arthralgias and gait problem.  Skin: Denies pallor, rash and wound.  Neurological: Denies dizziness, seizures, syncope, weakness, light-headedness, numbness and headaches.  Hematological: Denies adenopathy. Easy bruising, personal or family bleeding history  Psychiatric/Behavioral: Denies suicidal ideation, mood changes, confusion,  nervousness, sleep disturbance and agitation    Physical Exam: Vitals:   01/18/20 1107  BP: (!) 190/100  Pulse: 91  Temp: (!) 97.1 F (36.2 C)  TempSrc: Temporal  SpO2: 96%  Weight: 151  lb 8 oz (68.7 kg)    There is no height or weight on file to calculate BMI.   Constitutional: NAD, calm, comfortable Eyes: PERRL, lids and conjunctivae normal ENMT: Mucous membranes are moist.  Respiratory: clear to auscultation bilaterally, no wheezing, no crackles. Normal respiratory effort. No accessory muscle use.  Cardiovascular: Regular rate and rhythm, no murmurs / rubs / gallops. No extremity edema. 2+ pedal pulses. Neurologic: Grossly intact and nonfocal Psychiatric: Normal judgment and insight. Alert and oriented x 3. Normal mood.    Impression and Plan:  Hypertension associated with diabetes ([place]) -New diagnosis. -Start hydrochlorothiazide 25 mg, lisinopril 10 mg daily. -6-week blood pressure follow-up, consider basic metabolic profile at that time to follow renal function and electrolytes.   Patient Instructions  -Nice seeing you today!!  -Start HCTZ 25 mg daily and lisinopril 10 mg daily for your blood pressure.  -Schedule follow up in 6 weeks.  -Low salt diet (see below).   DASH Eating Plan DASH stands for "Dietary Approaches to Stop Hypertension." The DASH eating plan is a healthy eating plan that has been shown to reduce high blood pressure (hypertension). It may also reduce your risk for type 2 diabetes, heart disease, and stroke. The DASH eating plan may also help with weight loss. What are tips for following this plan?  General guidelines  Avoid eating more than 2,300 mg (milligrams) of salt (sodium) a day. If you have hypertension, you may need to reduce your sodium intake to 1,500 mg a day.  Limit alcohol intake to no more than 1 drink a day for nonpregnant women and 2 drinks a day for men. One drink equals 12 oz of beer, 5 oz of wine, or 1 oz of hard liquor.  Work with your health care provider to maintain a healthy body weight or to lose weight. Ask what an ideal weight is for you.  Get at least 30 minutes of exercise that causes your heart to beat  faster (aerobic exercise) most days of the week. Activities may include walking, swimming, or biking.  Work with your health care provider or diet and nutrition specialist (dietitian) to adjust your eating plan to your individual calorie needs. Reading food labels   Check food labels for the amount of sodium per serving. Choose foods with less than 5 percent of the Daily Value of sodium. Generally, foods with less than 300 mg of sodium per serving fit into this eating plan.  To find whole grains, look for the word "whole" as the first word in the ingredient list. Shopping  Buy products labeled as "low-sodium" or "no salt added."  Buy fresh foods. Avoid canned foods and premade or frozen meals. Cooking  Avoid adding salt when cooking. Use salt-free seasonings or herbs instead of table salt or sea salt. Check with your health care provider or pharmacist before using salt substitutes.  Do not fry foods. Cook foods using healthy methods such as baking, boiling, grilling, and broiling instead.  Cook with heart-healthy oils, such as olive, canola, soybean, or sunflower oil. Meal planning  Eat a balanced diet that includes: ? 5 or more servings of fruits and vegetables each day. At each meal, try to fill half of your plate with fruits and vegetables. ? Up to  6-8 servings of whole grains each day. ? Less than 6 oz of lean meat, poultry, or fish each day. A 3-oz serving of meat is about the same size as a deck of cards. One egg equals 1 oz. ? 2 servings of low-fat dairy each day. ? A serving of nuts, seeds, or beans 5 times each week. ? Heart-healthy fats. Healthy fats called Omega-3 fatty acids are found in foods such as flaxseeds and coldwater fish, like sardines, salmon, and mackerel.  Limit how much you eat of the following: ? Canned or prepackaged foods. ? Food that is high in trans fat, such as fried foods. ? Food that is high in saturated fat, such as fatty meat. ? Sweets, desserts,  sugary drinks, and other foods with added sugar. ? Full-fat dairy products.  Do not salt foods before eating.  Try to eat at least 2 vegetarian meals each week.  Eat more home-cooked food and less restaurant, buffet, and fast food.  When eating at a restaurant, ask that your food be prepared with less salt or no salt, if possible. What foods are recommended? The items listed may not be a complete list. Talk with your dietitian about what dietary choices are best for you. Grains Whole-grain or whole-wheat bread. Whole-grain or whole-wheat pasta. Brown rice. [place]. Bulgur. Whole-grain and low-sodium cereals. Pita bread. Low-fat, low-sodium crackers. Whole-wheat flour tortillas. Vegetables Fresh or frozen vegetables (raw, steamed, roasted, or grilled). Low-sodium or reduced-sodium tomato and vegetable juice. Low-sodium or reduced-sodium tomato sauce and tomato paste. Low-sodium or reduced-sodium canned vegetables. Fruits All fresh, dried, or frozen fruit. Canned fruit in natural juice (without added sugar). Meat and other protein foods Skinless chicken or [place]. Ground chicken or [place]. Pork with fat trimmed off. Fish and seafood. Egg whites. Dried beans, peas, or lentils. Unsalted nuts, nut butters, and seeds. Unsalted canned beans. Lean cuts of beef with fat trimmed off. Low-sodium, lean deli meat. Dairy Low-fat (1%) or fat-free (skim) milk. Fat-free, low-fat, or reduced-fat cheeses. Nonfat, low-sodium ricotta or cottage cheese. Low-fat or nonfat yogurt. Low-fat, low-sodium cheese. Fats and oils Soft margarine without trans fats. Vegetable oil. Low-fat, reduced-fat, or light mayonnaise and salad dressings (reduced-sodium). Canola, safflower, olive, soybean, and sunflower oils. Avocado. Seasoning and other foods Herbs. Spices. Seasoning mixes without salt. Unsalted popcorn and pretzels. Fat-free sweets. What foods are not recommended? The items listed may not be a complete list.  Talk with your dietitian about what dietary choices are best for you. Grains Baked goods made with fat, such as croissants, muffins, or some breads. Dry pasta or rice meal packs. Vegetables Creamed or fried vegetables. Vegetables in a cheese sauce. Regular canned vegetables (not low-sodium or reduced-sodium). Regular canned tomato sauce and paste (not low-sodium or reduced-sodium). Regular tomato and vegetable juice (not low-sodium or reduced-sodium). Angie Fava. Olives. Fruits Canned fruit in a light or heavy syrup. Fried fruit. Fruit in cream or butter sauce. Meat and other protein foods Fatty cuts of meat. Ribs. Fried meat. Berniece Salines. Sausage. Bologna and other processed lunch meats. Salami. Fatback. Hotdogs. Bratwurst. Salted nuts and seeds. Canned beans with added salt. Canned or smoked fish. Whole eggs or egg yolks. Chicken or [place] with skin. Dairy Whole or 2% milk, cream, and half-and-half. Whole or full-fat cream cheese. Whole-fat or sweetened yogurt. Full-fat cheese. Nondairy creamers. Whipped toppings. Processed cheese and cheese spreads. Fats and oils Butter. Stick margarine. Lard. Shortening. Ghee. Bacon fat. Tropical oils, such as coconut, palm kernel, or palm oil. Seasoning and  other foods Salted popcorn and pretzels. Onion salt, garlic salt, seasoned salt, table salt, and sea salt. Worcestershire sauce. Tartar sauce. Barbecue sauce. Teriyaki sauce. Soy sauce, including reduced-sodium. Steak sauce. Canned and packaged gravies. Fish sauce. Oyster sauce. Cocktail sauce. Horseradish that you find on the shelf. Ketchup. Mustard. Meat flavorings and tenderizers. Bouillon cubes. Hot sauce and Tabasco sauce. Premade or packaged marinades. Premade or packaged taco seasonings. Relishes. Regular salad dressings. Where to find more information:  National Heart, Lung, and [place]: https://wilson-eaton.com/  American Heart Association: www.heart.org Summary  The DASH eating plan is a healthy eating  plan that has been shown to reduce high blood pressure (hypertension). It may also reduce your risk for type 2 diabetes, heart disease, and stroke.  With the DASH eating plan, you should limit salt (sodium) intake to 2,300 mg a day. If you have hypertension, you may need to reduce your sodium intake to 1,500 mg a day.  When on the DASH eating plan, aim to eat more fresh fruits and vegetables, whole grains, lean proteins, low-fat dairy, and heart-healthy fats.  Work with your health care provider or diet and nutrition specialist (dietitian) to adjust your eating plan to your individual calorie needs. This information is not intended to replace advice given to you by your health care provider. Make sure you discuss any questions you have with your health care provider. Document Revised: 11/20/2017 Document Reviewed: 12/01/2016 Elsevier Patient Education  2020 [place], [place] [facility]

## 2020-01-18 NOTE — Patient Instructions (Signed)
-Nice seeing you today!!  -Start HCTZ 25 mg daily and lisinopril 10 mg daily for your blood pressure.  -Schedule follow up in 6 weeks.  -Low salt diet (see below).   DASH Eating Plan DASH stands for "Dietary Approaches to Stop Hypertension." The DASH eating plan is a healthy eating plan that has been shown to reduce high blood pressure (hypertension). It may also reduce your risk for type 2 diabetes, heart disease, and stroke. The DASH eating plan may also help with weight loss. What are tips for following this plan?  General guidelines  Avoid eating more than 2,300 mg (milligrams) of salt (sodium) a day. If you have hypertension, you may need to reduce your sodium intake to 1,500 mg a day.  Limit alcohol intake to no more than 1 drink a day for nonpregnant women and 2 drinks a day for men. One drink equals 12 oz of beer, 5 oz of wine, or 1 oz of hard liquor.  Work with your health care provider to maintain a healthy body weight or to lose weight. Ask what an ideal weight is for you.  Get at least 30 minutes of exercise that causes your heart to beat faster (aerobic exercise) most days of the week. Activities may include walking, swimming, or biking.  Work with your health care provider or diet and nutrition specialist (dietitian) to adjust your eating plan to your individual calorie needs. Reading food labels   Check food labels for the amount of sodium per serving. Choose foods with less than 5 percent of the Daily Value of sodium. Generally, foods with less than 300 mg of sodium per serving fit into this eating plan.  To find whole grains, look for the word "whole" as the first word in the ingredient list. Shopping  Buy products labeled as "low-sodium" or "no salt added."  Buy fresh foods. Avoid canned foods and premade or frozen meals. Cooking  Avoid adding salt when cooking. Use salt-free seasonings or herbs instead of table salt or sea salt. Check with your health care  provider or pharmacist before using salt substitutes.  Do not fry foods. Cook foods using healthy methods such as baking, boiling, grilling, and broiling instead.  Cook with heart-healthy oils, such as olive, canola, soybean, or sunflower oil. Meal planning  Eat a balanced diet that includes: ? 5 or more servings of fruits and vegetables each day. At each meal, try to fill half of your plate with fruits and vegetables. ? Up to 6-8 servings of whole grains each day. ? Less than 6 oz of lean meat, poultry, or fish each day. A 3-oz serving of meat is about the same size as a deck of cards. One egg equals 1 oz. ? 2 servings of low-fat dairy each day. ? A serving of nuts, seeds, or beans 5 times each week. ? Heart-healthy fats. Healthy fats called Omega-3 fatty acids are found in foods such as flaxseeds and coldwater fish, like sardines, salmon, and mackerel.  Limit how much you eat of the following: ? Canned or prepackaged foods. ? Food that is high in trans fat, such as fried foods. ? Food that is high in saturated fat, such as fatty meat. ? Sweets, desserts, sugary drinks, and other foods with added sugar. ? Full-fat dairy products.  Do not salt foods before eating.  Try to eat at least 2 vegetarian meals each week.  Eat more home-cooked food and less restaurant, buffet, and fast food.  When eating at  a restaurant, ask that your food be prepared with less salt or no salt, if possible. What foods are recommended? The items listed may not be a complete list. Talk with your dietitian about what dietary choices are best for you. Grains Whole-grain or whole-wheat bread. Whole-grain or whole-wheat pasta. Brown rice. [place]. Bulgur. Whole-grain and low-sodium cereals. Pita bread. Low-fat, low-sodium crackers. Whole-wheat flour tortillas. Vegetables Fresh or frozen vegetables (raw, steamed, roasted, or grilled). Low-sodium or reduced-sodium tomato and vegetable juice. Low-sodium or  reduced-sodium tomato sauce and tomato paste. Low-sodium or reduced-sodium canned vegetables. Fruits All fresh, dried, or frozen fruit. Canned fruit in natural juice (without added sugar). Meat and other protein foods Skinless chicken or [place]. Ground chicken or [place]. Pork with fat trimmed off. Fish and seafood. Egg whites. Dried beans, peas, or lentils. Unsalted nuts, nut butters, and seeds. Unsalted canned beans. Lean cuts of beef with fat trimmed off. Low-sodium, lean deli meat. Dairy Low-fat (1%) or fat-free (skim) milk. Fat-free, low-fat, or reduced-fat cheeses. Nonfat, low-sodium ricotta or cottage cheese. Low-fat or nonfat yogurt. Low-fat, low-sodium cheese. Fats and oils Soft margarine without trans fats. Vegetable oil. Low-fat, reduced-fat, or light mayonnaise and salad dressings (reduced-sodium). Canola, safflower, olive, soybean, and sunflower oils. Avocado. Seasoning and other foods Herbs. Spices. Seasoning mixes without salt. Unsalted popcorn and pretzels. Fat-free sweets. What foods are not recommended? The items listed may not be a complete list. Talk with your dietitian about what dietary choices are best for you. Grains Baked goods made with fat, such as croissants, muffins, or some breads. Dry pasta or rice meal packs. Vegetables Creamed or fried vegetables. Vegetables in a cheese sauce. Regular canned vegetables (not low-sodium or reduced-sodium). Regular canned tomato sauce and paste (not low-sodium or reduced-sodium). Regular tomato and vegetable juice (not low-sodium or reduced-sodium). Angie Fava. Olives. Fruits Canned fruit in a light or heavy syrup. Fried fruit. Fruit in cream or butter sauce. Meat and other protein foods Fatty cuts of meat. Ribs. Fried meat. Berniece Salines. Sausage. Bologna and other processed lunch meats. Salami. Fatback. Hotdogs. Bratwurst. Salted nuts and seeds. Canned beans with added salt. Canned or smoked fish. Whole eggs or egg yolks. Chicken or [place]  with skin. Dairy Whole or 2% milk, cream, and half-and-half. Whole or full-fat cream cheese. Whole-fat or sweetened yogurt. Full-fat cheese. Nondairy creamers. Whipped toppings. Processed cheese and cheese spreads. Fats and oils Butter. Stick margarine. Lard. Shortening. Ghee. Bacon fat. Tropical oils, such as coconut, palm kernel, or palm oil. Seasoning and other foods Salted popcorn and pretzels. Onion salt, garlic salt, seasoned salt, table salt, and sea salt. Worcestershire sauce. Tartar sauce. Barbecue sauce. Teriyaki sauce. Soy sauce, including reduced-sodium. Steak sauce. Canned and packaged gravies. Fish sauce. Oyster sauce. Cocktail sauce. Horseradish that you find on the shelf. Ketchup. Mustard. Meat flavorings and tenderizers. Bouillon cubes. Hot sauce and Tabasco sauce. Premade or packaged marinades. Premade or packaged taco seasonings. Relishes. Regular salad dressings. Where to find more information:  National Heart, Lung, and [place]: https://wilson-eaton.com/  American Heart Association: www.heart.org Summary  The DASH eating plan is a healthy eating plan that has been shown to reduce high blood pressure (hypertension). It may also reduce your risk for type 2 diabetes, heart disease, and stroke.  With the DASH eating plan, you should limit salt (sodium) intake to 2,300 mg a day. If you have hypertension, you may need to reduce your sodium intake to 1,500 mg a day.  When on the DASH eating plan, aim to eat  more fresh fruits and vegetables, whole grains, lean proteins, low-fat dairy, and heart-healthy fats.  Work with your health care provider or diet and nutrition specialist (dietitian) to adjust your eating plan to your individual calorie needs. This information is not intended to replace advice given to you by your health care provider. Make sure you discuss any questions you have with your health care provider. Document Revised: 11/20/2017 Document Reviewed:  12/01/2016 Elsevier Patient Education  2020 [employer].

## 2020-01-19 ENCOUNTER — Ambulatory Visit: Payer: Medicare HMO | Admitting: Internal Medicine

## 2020-01-23 ENCOUNTER — Other Ambulatory Visit: Payer: Self-pay

## 2020-01-25 ENCOUNTER — Other Ambulatory Visit: Payer: Self-pay

## 2020-01-25 ENCOUNTER — Encounter: Payer: Self-pay | Admitting: Internal Medicine

## 2020-01-25 ENCOUNTER — Ambulatory Visit (INDEPENDENT_AMBULATORY_CARE_PROVIDER_SITE_OTHER): Payer: Medicare HMO | Admitting: Internal Medicine

## 2020-01-25 VITALS — BP 180/80 | HR 80 | Wt 150.0 lb

## 2020-01-25 DIAGNOSIS — Z794 Long term (current) use of insulin: Secondary | ICD-10-CM | POA: Insufficient documentation

## 2020-01-25 DIAGNOSIS — E1022 Type 1 diabetes mellitus with diabetic chronic kidney disease: Secondary | ICD-10-CM

## 2020-01-25 DIAGNOSIS — E1121 Type 2 diabetes mellitus with diabetic nephropathy: Secondary | ICD-10-CM | POA: Diagnosis not present

## 2020-01-25 MED ORDER — OZEMPIC (0.25 OR 0.5 MG/DOSE) 2 MG/1.5ML ~~LOC~~ SOPN: 0.5000 mg | PEN_INJECTOR | SUBCUTANEOUS | 5 refills | Status: DC

## 2020-01-25 MED ORDER — LEVEMIR FLEXTOUCH 100 UNIT/ML ~~LOC~~ SOPN: 60.0000 [IU] | PEN_INJECTOR | Freq: Every day | SUBCUTANEOUS | 5 refills | Status: DC

## 2020-01-25 NOTE — Patient Instructions (Signed)
Please stop: - Tyler Aas   Please try: - Levemir 60 units at bedtime  Please change: - Novolog 15-20 units 3x a day, before meals  Please start: - Ozempic 0.25 mg weekly in a.m. (for example on Sunday morning) x 4 weeks, then increase to 0.5 mg weekly in a.m. if no nausea or hypoglycemia.  Please return in 1.5 months with your sugar log.   PATIENT INSTRUCTIONS FOR TYPE 2 DIABETES:  **Please join MyChart!** - see attached instructions about how to join if you have not done so already.  DIET AND EXERCISE Diet and exercise is an important part of diabetic treatment.  We recommended aerobic exercise in the form of brisk walking (working between 40-60% of maximal aerobic capacity, similar to brisk walking) for 150 minutes per week (such as 30 minutes five days per week) along with 3 times per week performing 'resistance' training (using various gauge rubber tubes with handles) 5-10 exercises involving the major muscle groups (upper body, lower body and core) performing 10-15 repetitions (or near fatigue) each exercise. Start at half the above goal but build slowly to reach the above goals. If limited by weight, joint pain, or disability, we recommend daily walking in a swimming pool with water up to waist to reduce pressure from joints while allow for adequate exercise.    BLOOD GLUCOSES Monitoring your blood glucoses is important for continued management of your diabetes. Please check your blood glucoses 2-4 times a day: fasting, before meals and at bedtime (you can rotate these measurements - e.g. one day check before the 3 meals, the next day check before 2 of the meals and before bedtime, etc.).   HYPOGLYCEMIA (low blood sugar) Hypoglycemia is usually a reaction to not eating, exercising, or taking too much insulin/ other diabetes drugs.  Symptoms include tremors, sweating, hunger, confusion, headache, etc. Treat IMMEDIATELY with 15 grams of Carbs: . 4 glucose tablets .  cup regular  juice/soda . 2 tablespoons raisins . 4 teaspoons sugar . 1 tablespoon honey Recheck blood glucose in 15 mins and repeat above if still symptomatic/blood glucose <100.  RECOMMENDATIONS TO REDUCE YOUR RISK OF DIABETIC COMPLICATIONS: * Take your prescribed MEDICATION(S) * Follow a DIABETIC diet: Complex carbs, fiber rich foods, (monounsaturated and polyunsaturated) fats * AVOID saturated/trans fats, high fat foods, >2,300 mg salt per day. * EXERCISE at least 5 times a week for 30 minutes or preferably daily.  * DO NOT SMOKE OR DRINK more than 1 drink a day. * Check your FEET every day. Do not wear tightfitting shoes. Contact us if you develop an ulcer * See your EYE doctor once a year or more if needed * Get a FLU shot once a year * Get a PNEUMONIA vaccine once before and once after age 26 years  GOALS:  * Your Hemoglobin A1c of <7%  * fasting sugars need to be <130 * after meals sugars need to be <180 (2h after you start eating) * Your Systolic BP should be [id number] or lower  * Your Diastolic BP should be 80 or lower  * Your HDL (Good Cholesterol) should be 40 or higher  * Your LDL (Bad Cholesterol) should be 100 or lower. * Your Triglycerides should be 150 or lower  * Your Urine microalbumin (kidney function) should be <30 * Your Body Mass Index should be 25 or lower    Please consider the following ways to cut down carbs and fat and increase fiber and micronutrients in your diet: - substitute  whole grain for white bread or pasta - substitute brown rice for white rice - substitute 90-calorie flat bread pieces for slices of bread when possible - substitute sweet potatoes or yams for white potatoes - substitute humus for margarine - substitute tofu for cheese when possible - substitute almond or rice milk for regular milk (would not drink soy milk daily due to concern for soy estrogen influence on breast cancer risk) - substitute dark chocolate for other sweets when possible -  substitute water - can add lemon or orange slices for taste - for diet sodas (artificial sweeteners will trick your body that you can eat sweets without getting calories and will lead you to overeating and weight gain in the long run) - do not skip breakfast or other meals (this will slow down the metabolism and will result in more weight gain over time)  - can try smoothies made from fruit and almond/rice milk in am instead of regular breakfast - can also try old-fashioned (not instant) oatmeal made with almond/rice milk in am - order the dressing on the side when eating salad at a restaurant (pour less than half of the dressing on the salad) - eat as little meat as possible - can try juicing, but should not forget that juicing will get rid of the fiber, so would alternate with eating raw veg./fruits or drinking smoothies - use as little oil as possible, even when using olive oil - can dress a salad with a mix of balsamic vinegar and lemon juice, for e.g. - use agave nectar, stevia sugar, or regular sugar rather than artificial sweateners - steam or broil/roast veggies  - snack on veggies/fruit/nuts (unsalted, preferably) when possible, rather than processed foods - reduce or eliminate aspartame in diet (it is in diet sodas, chewing gum, etc) Read the labels!  Try to read Dr. Janene Harvey book: "Program for Reversing Diabetes" for other ideas for healthy eating.

## 2020-01-25 NOTE — Progress Notes (Signed)
Patient ID: Dana [name], female   DOB: [birth year], 74 y.o.   MRN: [id number]   This visit occurred during the SARS-CoV-2 public health emergency.  Safety protocols were in place, including screening questions prior to the visit, additional usage of staff PPE, and extensive cleaning of exam room while observing appropriate contact time as indicated for disinfecting solutions.   HPI: Dana [name] is a 74 y.o.-year-old female, referred by her PCP, Dana [name], for management of DM2, dx in 1999, insulin-dependent since 2013, uncontrolled, with complications (CKD).  Patient recently establish care with Dana [name] and was found to have a very high HbA1c. She recently moved from [place] - Dana [name] [phone].  Reviewed latest HbA1c: Lab Results  Component Value Date   HGBA1C 13.4 (A) 12/02/2019   Pt is on a regimen of: - Tresiba 48 >> 60 units daily -increased recently by PCP - Novolog 20 units 3x a day, before meals She tried Lantus >> not responding well to this.  Pt is not checking sugars now. - am: n/c - 2h after b'fast: n/c - before lunch: n/c - 2h after lunch: n/c - before dinner: n/c - 2h after dinner: n/c - bedtime: n/c - nighttime: n/c Lowest sugar was 140; she has hypoglycemia awareness at 70.  Highest sugar was 280.  Glucometer: none now  Pt's meals are: - Breakfast: special K cereals + 3/4 cup of milk; boiled egg + wholewheat toast; cranberry juice - apple - Lunch: whole wheat tuna salad sandwich - Dinner: baked chicken and salad or veggies - Snacks: see above  -+ CKD, last BUN/creatinine:  Lab Results  Component Value Date   BUN 18 12/18/2019   BUN 27 (H) 12/09/2019   CREATININE 1.22 (H) 12/18/2019   CREATININE 1.04 (H) 12/09/2019  She is not on ACE inhibitor/ARB  - +HL, lipids not available for review: No results found for: CHOL, HDL, LDLCALC, LDLDIRECT, TRIG, CHOLHDL She is not on a statin.  - last eye exam was in 2019 -  [place]. No DR. + cataracts - had 1 sx.  - no numbness and tingling in her feet. Has a podiatrist.  Pt has FH of DM in mother and 4 siblings.  She also has a history of HTN.  She is walking for exercise twice a week  ROS: Constitutional: no weight gain, + weight loss, + increased appetite, no fatigue, no subjective hyperthermia, no subjective hypothermia, no nocturia, + poor sleep Eyes: no blurry vision, no xerophthalmia ENT: no sore throat, no nodules palpated in neck, no dysphagia, no odynophagia, no hoarseness, no tinnitus, no hypoacusis Cardiovascular: + CP, no SOB, no palpitations, no leg swelling Respiratory: no cough, no SOB, no wheezing Gastrointestinal: no N, no V, no D, + C, no acid reflux Musculoskeletal: no muscle, + joint aches Skin: no rash, + hair loss Neurological: no tremors, no numbness or tingling/no dizziness/no HAs Psychiatric: no depression, no anxiety  Past Medical History:  Diagnosis Date  . DM (diabetes mellitus), type 1 ([place])   . Hypertension    Past Surgical History:  Procedure Laterality Date  . HYSTERECTOMY ABDOMINAL WITH SALPINGO-OOPHORECTOMY     Social History   Socioeconomic History  . Marital status: Married    Spouse name: Not on file  . Number of children: 2  . Years of education: Not on file  . Highest education level: Not on file  Occupational History  .  Healthcare  Tobacco Use  . Smoking status: Never Smoker  . Smokeless tobacco:  Never Used  Substance and Sexual Activity  . Alcohol use: Never  . Drug use: Never   Social Determinants of Health   Financial Resource Strain:   . Difficulty of Paying Living Expenses: Not on file  Food Insecurity:   . Worried About Charity fundraiser in the Last Year: Not on file  . Ran Out of Food in the Last Year: Not on file  Transportation Needs:   . Lack of Transportation (Medical): Not on file  . Lack of Transportation (Non-Medical): Not on file  Physical Activity:   . Days of  Exercise per Week: Not on file  . Minutes of Exercise per Session: Not on file  Stress:   . Feeling of Stress : Not on file  Social Connections:   . Frequency of Communication with Friends and Family: Not on file  . Frequency of Social Gatherings with Friends and Family: Not on file  . Attends Religious Services: Not on file  . Active Member of Clubs or Organizations: Not on file  . Attends Archivist Meetings: Not on file  . Marital Status: Not on file  Intimate Partner Violence:   . Fear of Current or Ex-Partner: Not on file  . Emotionally Abused: Not on file  . Physically Abused: Not on file  . Sexually Abused: Not on file   Current Outpatient Medications on File Prior to Visit  Medication Sig Dispense Refill  . Continuous Blood Gluc Receiver (FREESTYLE LIBRE 14 DAY READER) DEVI 1 Device by Does not apply route every 14 (fourteen) days. 12 each 2  . hydrochlorothiazide (HYDRODIURIL) 25 MG tablet Take 1 tablet (25 mg total) by mouth daily. 90 tablet 1  . insulin aspart (NOVOLOG FLEXPEN) 100 UNIT/ML FlexPen Inject 25 Units into the skin 3 (three) times daily with meals. 15 mL 0  . Insulin Degludec (TRESIBA FLEXTOUCH) 200 UNIT/ML SOPN Inject 60 Units into the skin daily. 15 mL 0  . lisinopril (ZESTRIL) 10 MG tablet Take 1 tablet (10 mg total) by mouth daily. 90 tablet 1   No current facility-administered medications on file prior to visit.   Allergies  Allergen Reactions  . Clindamycin/Lincomycin   . Motrin [Ibuprofen]    Family History  Problem Relation Age of Onset  . Diabetes Mother   . Hypertension Father   . Diabetes Sister   . Diabetes Brother   . Diabetes Sister   . Hypertension Sister   . Diabetes Brother   . Diabetes Brother    PE: BP (!) 180/80   Pulse 80   Wt 150 lb (68 kg)   SpO2 95%  Wt Readings from Last 3 Encounters:  01/25/20 150 lb (68 kg)  01/18/20 151 lb 8 oz (68.7 kg)  12/02/19 155 lb 3.2 oz (70.4 kg)   Constitutional: normal weight,  in NAD Eyes: PERRLA, EOMI, no exophthalmos ENT: moist mucous membranes, no thyromegaly, no cervical lymphadenopathy Cardiovascular: RRR, No MRG Respiratory: CTA B Gastrointestinal: abdomen soft, NT, ND, BS+ Musculoskeletal: no deformities, strength intact in all 4 Skin: moist, warm, no rashes Neurological: no tremor with outstretched hands, DTR normal in all 4  ASSESSMENT: 1. DM2, insulin-dependent, uncontrolled, with  Complications - CKD  PLAN:  1. Patient with long-standing, uncontrolled diabetes, on basal-bolus insulin regimen, which became insufficient.  Latest HbA1c was very high, is 13.4% 2 months ago.  Her dose of long-acting insulin was increased at that time by PCP.  She was also referred to endocrinology.  -Upon questioning, at  this visit, she tells me that she is compliant with her insulin regimen but she feels that her Tyler Aas is not working for her.  She has tried Lantus in the past and this did not work either.  It is unclear why she has such a high HbA1c while taking all of the insulin injections.  We did discuss at next visit and we can try to change her regimen to Levemir and NovoLog.  We will keep the same dose of Levemir as she was taking [place] for now, but I advised her to change the dose of NovoLog based on the size of her meals.  I also advised her to take this 15 minutes before a meal.  We also discussed about trying to add a weekly GLP-1 receptor agonist.  She does not have a history of pancreatitis or family history of medullary thyroid cancer.  She tells me she has not been insurance so I hope this will be approved for her.  Discussed about possible benefits on the cardiovascular and kidney systems and also on weight. -She does not have a meter right now but is going to the pharmacy to pick up the freestyle libre CGM. -She is also interested in a referral to nutrition, which was placed today - I suggested to:  Patient Instructions  Please stop: - Tyler Aas   Please  try: - Levemir 60 units at bedtime  Please change: - Novolog 15-20 units 3x a day, before meals  Please start: - Ozempic 0.25 mg weekly in a.m. (for example on Sunday morning) x 4 weeks, then increase to 0.5 mg weekly in a.m. if no nausea or hypoglycemia.  Please return in 1.5 months with your sugar log.   - Strongly advised her to start checking sugars at different times of the day - check 3x a day, rotating checks - discussed about CBG targets for treatment: 80-130 mg/dL before meals and <180 mg/dL after meals; target HbA1c <7%. - given sugar log and advised how to fill it and to bring it at next appt  - given foot care handout and explained the principles  - given instructions for hypoglycemia management "15-15 rule"  - advised for yearly eye exams  - Return to clinic in 1.5 mo with sugar log   Philemon Kingdom, MD PhD [facility]

## 2020-02-02 ENCOUNTER — Encounter: Payer: Self-pay | Admitting: Internal Medicine

## 2020-02-02 ENCOUNTER — Other Ambulatory Visit: Payer: Self-pay

## 2020-02-02 ENCOUNTER — Ambulatory Visit (INDEPENDENT_AMBULATORY_CARE_PROVIDER_SITE_OTHER): Payer: Medicare HMO | Admitting: Internal Medicine

## 2020-02-02 VITALS — BP 120/80 | HR 79 | Temp 97.7°F | Wt 150.7 lb

## 2020-02-02 DIAGNOSIS — E1159 Type 2 diabetes mellitus with other circulatory complications: Secondary | ICD-10-CM | POA: Diagnosis not present

## 2020-02-02 DIAGNOSIS — I10 Essential (primary) hypertension: Secondary | ICD-10-CM

## 2020-02-02 DIAGNOSIS — B373 Candidiasis of vulva and vagina: Secondary | ICD-10-CM | POA: Diagnosis not present

## 2020-02-02 DIAGNOSIS — I152 Hypertension secondary to endocrine disorders: Secondary | ICD-10-CM

## 2020-02-02 DIAGNOSIS — B3731 Acute candidiasis of vulva and vagina: Secondary | ICD-10-CM

## 2020-02-02 MED ORDER — FLUCONAZOLE 150 MG PO TABS: 150.0000 mg | ORAL_TABLET | Freq: Once | ORAL | 0 refills | Status: AC

## 2020-02-02 NOTE — Patient Instructions (Signed)
-  Nice seeing you today!!  -Take diflucan 150 mg ONCE.  -Schedule a 3 month follow up.

## 2020-02-02 NOTE — Progress Notes (Signed)
Established Patient Office Visit     This visit occurred during the SARS-CoV-2 public health emergency.  Safety protocols were in place, including screening questions prior to the visit, additional usage of staff PPE, and extensive cleaning of exam room while observing appropriate contact time as indicated for disinfecting solutions.    CC/Reason for Visit: Blood pressure follow-up  HPI: Dana [name] is a 74 y.o. female who is coming in today for the above mentioned reasons.  She was recently diagnosed with hypertension with a blood pressure of 190/100.  She was started on hydrochlorothiazide 25 mg and lisinopril 10 mg in January 2017 is here for follow-up.  She has had no issues tolerating medication.  Blood pressure in office today is 120/80.  She has been complaining of vaginal itching and swelling of the labia majora.   Past Medical/Surgical History: Past Medical History:  Diagnosis Date  . DM (diabetes mellitus), type 1 ([place])   . Hypertension     Past Surgical History:  Procedure Laterality Date  . HYSTERECTOMY ABDOMINAL WITH SALPINGO-OOPHORECTOMY      Social History:  reports that she has never smoked. She has never used smokeless tobacco. She reports that she does not drink alcohol or use drugs.  Allergies: Allergies  Allergen Reactions  . Clindamycin/Lincomycin   . Motrin [Ibuprofen]     Family History:  Family History  Problem Relation Age of Onset  . Diabetes Mother   . Hypertension Father   . Diabetes Sister   . Diabetes Brother   . Diabetes Sister   . Hypertension Sister   . Diabetes Brother   . Diabetes Brother      Current Outpatient Medications:  .  ASPIRIN 81 PO, Take by mouth., Disp: , Rfl:  .  hydrochlorothiazide (HYDRODIURIL) 25 MG tablet, Take 1 tablet (25 mg total) by mouth daily., Disp: 90 tablet, Rfl: 1 .  Insulin Detemir (LEVEMIR FLEXTOUCH) 100 UNIT/ML Pen, Inject 60 Units into the skin daily., Disp: 15 mL, Rfl: 5 .  lisinopril  (ZESTRIL) 10 MG tablet, Take 1 tablet (10 mg total) by mouth daily., Disp: 90 tablet, Rfl: 1 .  Omega-3 Fatty Acids (FISH OIL PO), Take by mouth., Disp: , Rfl:  .  Semaglutide,0.25 or 0.5MG /DOS, (OZEMPIC, 0.25 OR 0.5 MG/DOSE,) 2 MG/1.5ML SOPN, Inject 0.5 mg into the skin once a week., Disp: 2 pen, Rfl: 5 .  TURMERIC PO, Take by mouth., Disp: , Rfl:  .  fluconazole (DIFLUCAN) 150 MG tablet, Take 1 tablet (150 mg total) by mouth once for 1 dose., Disp: 1 tablet, Rfl: 0  Review of Systems:  Constitutional: Denies fever, chills, diaphoresis, appetite change and fatigue.  HEENT: Denies photophobia, eye pain, redness, hearing loss, ear pain, congestion, sore throat, rhinorrhea, sneezing, mouth sores, trouble swallowing, neck pain, neck stiffness and tinnitus.   Respiratory: Denies SOB, DOE, cough, chest tightness,  and wheezing.   Cardiovascular: Denies chest pain, palpitations and leg swelling.  Gastrointestinal: Denies nausea, vomiting, abdominal pain, diarrhea, constipation, blood in stool and abdominal distention.  Genitourinary: Denies dysuria, urgency, frequency, hematuria, flank pain and difficulty urinating.  Endocrine: Denies: hot or cold intolerance, sweats, changes in hair or nails, polyuria, polydipsia. Musculoskeletal: Denies myalgias, back pain, joint swelling, arthralgias and gait problem.  Skin: Denies pallor, rash and wound.  Neurological: Denies dizziness, seizures, syncope, weakness, light-headedness, numbness and headaches.  Hematological: Denies adenopathy. Easy bruising, personal or family bleeding history  Psychiatric/Behavioral: Denies suicidal ideation, mood changes, confusion, nervousness, sleep disturbance  and agitation    Physical Exam: Vitals:   02/02/20 1427  BP: 120/80  Pulse: 79  Temp: 97.7 F (36.5 C)  TempSrc: Temporal  SpO2: 98%  Weight: 150 lb 11.2 oz (68.4 kg)    There is no height or weight on file to calculate BMI.   Constitutional: NAD, calm,  comfortable Eyes: PERRL, lids and conjunctivae normal, wears corrective lenses ENMT: Mucous membranes are moist. Respiratory: clear to auscultation bilaterally, no wheezing, no crackles. Normal respiratory effort. No accessory muscle use.  Cardiovascular: Regular rate and rhythm, no murmurs / rubs / gallops. No extremity edema.   Neurologic: Grossly intact and nonfocal Psychiatric: Normal judgment and insight. Alert and oriented x 3. Normal mood.    Impression and Plan:  Hypertension associated with diabetes ([place]) -Well-controlled on current medication. -Follow-up in 3 months, at that time check renal function and electrolytes.  Vaginal yeast infection  - Plan: fluconazole (DIFLUCAN) 150 MG tablet    Patient Instructions  -Nice seeing you today!!  -Take diflucan 150 mg ONCE.  -Schedule a 3 month follow up.       Lelon Frohlich, MD [facility] at [facility]

## 2020-02-24 ENCOUNTER — Encounter: Payer: Medicare HMO | Attending: Internal Medicine | Admitting: Dietician

## 2020-03-08 ENCOUNTER — Ambulatory Visit: Payer: Medicare HMO | Admitting: Internal Medicine

## 2020-03-08 DIAGNOSIS — Z0289 Encounter for other administrative examinations: Secondary | ICD-10-CM

## 2020-03-09 ENCOUNTER — Ambulatory Visit: Payer: Medicare HMO | Admitting: Podiatry

## 2020-03-28 ENCOUNTER — Other Ambulatory Visit: Payer: Self-pay

## 2020-03-28 ENCOUNTER — Encounter (HOSPITAL_BASED_OUTPATIENT_CLINIC_OR_DEPARTMENT_OTHER): Payer: Self-pay

## 2020-03-28 ENCOUNTER — Emergency Department (HOSPITAL_BASED_OUTPATIENT_CLINIC_OR_DEPARTMENT_OTHER)
Admission: EM | Admit: 2020-03-28 | Discharge: 2020-03-28 | Disposition: A | Discharge status: Home / Self Care | Payer: Medicare HMO | Attending: Emergency Medicine | Admitting: Emergency Medicine

## 2020-03-28 ENCOUNTER — Emergency Department (HOSPITAL_BASED_OUTPATIENT_CLINIC_OR_DEPARTMENT_OTHER): Payer: Medicare HMO

## 2020-03-28 ENCOUNTER — Telehealth: Payer: Self-pay | Admitting: Internal Medicine

## 2020-03-28 DIAGNOSIS — Z881 Allergy status to other antibiotic agents status: Secondary | ICD-10-CM | POA: Diagnosis not present

## 2020-03-28 DIAGNOSIS — Z79899 Other long term (current) drug therapy: Secondary | ICD-10-CM | POA: Diagnosis not present

## 2020-03-28 DIAGNOSIS — Z794 Long term (current) use of insulin: Secondary | ICD-10-CM | POA: Diagnosis not present

## 2020-03-28 DIAGNOSIS — E109 Type 1 diabetes mellitus without complications: Secondary | ICD-10-CM | POA: Diagnosis not present

## 2020-03-28 DIAGNOSIS — R42 Dizziness and giddiness: Secondary | ICD-10-CM

## 2020-03-28 DIAGNOSIS — H538 Other visual disturbances: Secondary | ICD-10-CM | POA: Insufficient documentation

## 2020-03-28 DIAGNOSIS — Z7982 Long term (current) use of aspirin: Secondary | ICD-10-CM | POA: Insufficient documentation

## 2020-03-28 DIAGNOSIS — Z886 Allergy status to analgesic agent status: Secondary | ICD-10-CM | POA: Insufficient documentation

## 2020-03-28 DIAGNOSIS — I10 Essential (primary) hypertension: Secondary | ICD-10-CM | POA: Insufficient documentation

## 2020-03-28 LAB — COMPREHENSIVE METABOLIC PANEL
ALT: 16 U/L (ref 0–44)
AST: 18 U/L (ref 15–41)
Albumin: 3.4 g/dL — ABNORMAL LOW (ref 3.5–5.0)
Alkaline Phosphatase: 91 U/L (ref 38–126)
Anion gap: 9 (ref 5–15)
BUN: 41 mg/dL — ABNORMAL HIGH (ref 8–23)
CO2: 25 mmol/L (ref 22–32)
Calcium: 9.1 mg/dL (ref 8.9–10.3)
Chloride: 98 mmol/L (ref 98–111)
Creatinine, Ser: 1.67 mg/dL — ABNORMAL HIGH (ref 0.44–1.00)
GFR calc Af Amer: 35 mL/min — ABNORMAL LOW (ref >60)
GFR calc non Af Amer: 30 mL/min — ABNORMAL LOW (ref >60)
Glucose, Bld: 370 mg/dL — ABNORMAL HIGH (ref 70–99)
Potassium: 4.1 mmol/L (ref 3.5–5.1)
Sodium: 132 mmol/L — ABNORMAL LOW (ref 135–145)
Total Bilirubin: 0.4 mg/dL (ref 0.3–1.2)
Total Protein: 7.2 g/dL (ref 6.5–8.1)

## 2020-03-28 LAB — CBC WITH DIFFERENTIAL/PLATELET
Abs Immature Granulocytes: 0.02 10*3/uL (ref 0.00–0.07)
Basophils Absolute: 0 10*3/uL (ref 0.0–0.1)
Basophils Relative: 0 %
Eosinophils Absolute: 0.1 10*3/uL (ref 0.0–0.5)
Eosinophils Relative: 2 %
HCT: 30.3 % — ABNORMAL LOW (ref 36.0–46.0)
Hemoglobin: 9.8 g/dL — ABNORMAL LOW (ref 12.0–15.0)
Immature Granulocytes: 0 %
Lymphocytes Relative: 42 %
Lymphs Abs: 2.3 10*3/uL (ref 0.7–4.0)
MCH: 29.8 pg (ref 26.0–34.0)
MCHC: 32.3 g/dL (ref 30.0–36.0)
MCV: 92.1 fL (ref 80.0–100.0)
Monocytes Absolute: 0.5 10*3/uL (ref 0.1–1.0)
Monocytes Relative: 8 %
Neutro Abs: 2.6 10*3/uL (ref 1.7–7.7)
Neutrophils Relative %: 48 %
Platelets: 219 10*3/uL (ref 150–400)
RBC: 3.29 MIL/uL — ABNORMAL LOW (ref 3.87–5.11)
RDW: 12.6 % (ref 11.5–15.5)
WBC: 5.5 10*3/uL (ref 4.0–10.5)
nRBC: 0 % (ref 0.0–0.2)

## 2020-03-28 LAB — TSH: TSH: 1.093 u[IU]/mL (ref 0.350–4.500)

## 2020-03-28 MED ORDER — IOHEXOL 350 MG/ML SOLN
100.0000 mL | Freq: Once | INTRAVENOUS | Status: AC | PRN
  Administered 2020-03-28: 80 mL via INTRAVENOUS

## 2020-03-28 NOTE — Telephone Encounter (Signed)
Patient arrived at the ED 

## 2020-03-28 NOTE — ED Notes (Signed)
PT to CT scan

## 2020-03-28 NOTE — Telephone Encounter (Signed)
Left message on machine for patient to return our call 

## 2020-03-28 NOTE — Discharge Instructions (Addendum)
Follow-up with ophthalmology in the next few days.  The contact information for Dr. Posey Pronto has been provided in this discharge summary for you to call and make these arrangements.  Follow-up with neurosurgery in the next 1 to 2 weeks.  The contact information for Dr. Ronnald Ramp has been provided in this discharge summary for you to call and make these arrangements.  Return to the emergency department in the meantime if your symptoms significantly worsen or change.

## 2020-03-28 NOTE — ED Notes (Signed)
Pt given 2 warm blankets and visitor crackers and water

## 2020-03-28 NOTE — Telephone Encounter (Signed)
Pt would like to let Dr. Jerilee Hoh know that the blood pressure medication she put her on is causing side effects. She has pain on both sides of her neck and can not see when she wakes up in the morning. She feels almost blind and stumbles over things in the morning.    Offered to schedule an appt, she would rather see what Dr. Jerilee Hoh would like her to do.   Please give pt a call back.

## 2020-03-28 NOTE — Progress Notes (Signed)
CT pending 20g IV, Rod Holler RN aware

## 2020-03-28 NOTE — ED Notes (Signed)
Pt on monitor 

## 2020-03-28 NOTE — ED Provider Notes (Signed)
[facility] EMERGENCY DEPARTMENT Provider Note   CSN: [id number] Arrival date & time: 03/28/20  1542     History Chief Complaint  Patient presents with  . Dizziness    Dana [name] is a 74 y.o. female.  Patient is a 74 year old female with past medical history of diabetes and hypertension.  She presents with complaints of intermittent dizziness, blurry vision, and pain in her neck that has been worsening over the past 2 months.  This seemed to begin after medication changes by her doctor.  Patient recently relocated here from [place] and her physician here changed her medications due to an elevated hemoglobin A1c and elevated blood pressure.  Patient describes a blurry vision that seems most prevalent in the morning upon waking and dizziness that she describes as an "unsteadiness" that occurs throughout the day.  She is also describing pain to the left ear and pain in her neck.  She denies any fevers or chills.  The history is provided by the patient.  Dizziness Quality:  Lightheadedness Severity:  Moderate Onset quality:  Gradual Duration:  2 months Timing:  Intermittent Progression:  Worsening Chronicity:  New Relieved by:  Nothing Worsened by:  Nothing Ineffective treatments:  None tried      Past Medical History:  Diagnosis Date  . DM (diabetes mellitus), type 1 ([place])   . Hypertension     Patient Active Problem List   Diagnosis Date Noted  . Type 2 diabetes mellitus with diabetic nephropathy, with long-term current use of insulin ([place]) 01/25/2020  . DM (diabetes mellitus), type 1 ([place])     Past Surgical History:  Procedure Laterality Date  . HYSTERECTOMY ABDOMINAL WITH SALPINGO-OOPHORECTOMY       OB History   No obstetric history on file.     Family History  Problem Relation Age of Onset  . Diabetes Mother   . Hypertension Father   . Diabetes Sister   . Diabetes Brother   . Diabetes Sister   . Hypertension Sister   . Diabetes Brother    . Diabetes Brother     Social History   Tobacco Use  . Smoking status: Never Smoker  . Smokeless tobacco: Never Used  Substance Use Topics  . Alcohol use: Never  . Drug use: Never    Home Medications Prior to Admission medications   Medication Sig Start Date End Date Taking? Authorizing Provider  ASPIRIN 81 PO Take by mouth.    [provider]  hydrochlorothiazide (HYDRODIURIL) 25 MG tablet Take 1 tablet (25 mg total) by mouth daily. 01/18/20   Isaac Bliss, Rayford Halsted, MD  Insulin Detemir (LEVEMIR FLEXTOUCH) 100 UNIT/ML Pen Inject 60 Units into the skin daily. 01/25/20   Philemon Kingdom, MD  lisinopril (ZESTRIL) 10 MG tablet Take 1 tablet (10 mg total) by mouth daily. 01/18/20   Isaac Bliss, Rayford Halsted, MD  Omega-3 Fatty Acids (FISH OIL PO) Take by mouth.    [provider]  Semaglutide,0.25 or 0.5MG /DOS, (OZEMPIC, 0.25 OR 0.5 MG/DOSE,) 2 MG/1.5ML SOPN Inject 0.5 mg into the skin once a week. 01/25/20   Philemon Kingdom, MD  TURMERIC PO Take by mouth.    [provider]    Allergies    Clindamycin/lincomycin and Motrin [ibuprofen]  Review of Systems   Review of Systems  Neurological: Positive for dizziness.  All other systems reviewed and are negative.   Physical Exam Updated Vital Signs BP (!) 159/73 (BP Location: Right Arm)   Pulse 78  Temp 98.5 F (36.9 C) (Oral)   Resp 16   Ht 5\' 3"  (1.6 m)   Wt 68 kg   SpO2 99%   BMI 26.57 kg/m   Physical Exam Vitals and nursing note reviewed.  Constitutional:      General: She is not in acute distress.    Appearance: She is well-developed. She is not diaphoretic.  HENT:     Head: Normocephalic and atraumatic.  Eyes:     Extraocular Movements: Extraocular movements intact.     Pupils: Pupils are equal, round, and reactive to light.  Cardiovascular:     Rate and Rhythm: Normal rate and regular rhythm.     Heart sounds: No murmur. No friction rub. No gallop.   Pulmonary:     Effort:  Pulmonary effort is normal. No respiratory distress.     Breath sounds: Normal breath sounds. No wheezing.  Abdominal:     General: Bowel sounds are normal. There is no distension.     Palpations: Abdomen is soft.     Tenderness: There is no abdominal tenderness.  Musculoskeletal:        General: Normal range of motion.     Cervical back: Normal range of motion and neck supple.  Skin:    General: Skin is warm and dry.  Neurological:     General: No focal deficit present.     Mental Status: She is alert and oriented to person, place, and time.     Cranial Nerves: No cranial nerve deficit.     Motor: No weakness.     ED Results / Procedures / Treatments   Labs (all labs ordered are listed, but only abnormal results are displayed) Labs Reviewed  COMPREHENSIVE METABOLIC PANEL  CBC WITH DIFFERENTIAL/PLATELET  TSH    EKG EKG Interpretation  Date/Time:  Wednesday March 28 2020 16:48:26 EDT Ventricular Rate:  71 PR Interval:    QRS Duration: 89 QT Interval:  410 QTC Calculation: 446 R Axis:   -7 Text Interpretation: Sinus rhythm Left ventricular hypertrophy Baseline wander in lead(s) I III aVL No significant change since 12/18/2019 Confirmed by Veryl Speak [phone]) on 03/28/2020 4:56:05 PM   Radiology No results found.  Procedures Procedures (including critical care time)  Medications Ordered in ED Medications - No data to display  ED Course  I have reviewed the triage vital signs and the nursing notes.  Pertinent labs & imaging results that were available during my care of the patient were reviewed by me and considered in my medical decision making (see chart for details).    MDM Rules/Calculators/A&P  Patient presenting here with complaints of blurry vision upon awakening in the morning as described in the HPI, as well as pain to her neck and right ear.  She is also felt dizzy intermittently.  Patient's neurologic exam is nonfocal and vitals are stable.  Her  work-up reveals unremarkable laboratory studies with the exception of a mild anemia with hemoglobin of 9.8.  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  Patient will be referred to neurology to discuss the appropriateness of possible treatment.  She will also be referred to ophthalmology to have her eyes examined as well.  Final Clinical Impression(s) / ED Diagnoses Final diagnoses:  None    Rx / DC Orders ED Discharge Orders    None       Veryl Speak, MD 03/28/20 1844

## 2020-03-28 NOTE — Telephone Encounter (Signed)
Spoke to patient and she agrees with Dr Jerilee Hoh and will go to the ED.  Waiting on patient's arrival.

## 2020-03-28 NOTE — ED Triage Notes (Signed)
Pt c/o dizziness, blurred vision, pain to right ear and right side of neck x ~36month-pt states started after being put on new HTN meds by PCP-states she was advised by PCP to come to ED-to triage in w/c

## 2020-03-28 NOTE — Telephone Encounter (Signed)
This is an emergency and should be evaluated ASAP in the ED. Could be a stroke.

## 2020-04-10 ENCOUNTER — Telehealth: Payer: Self-pay | Admitting: Internal Medicine

## 2020-04-10 ENCOUNTER — Other Ambulatory Visit: Payer: Self-pay

## 2020-04-10 NOTE — Telephone Encounter (Signed)
Appointment scheduled with Dr Burchette 

## 2020-04-10 NOTE — Telephone Encounter (Signed)
Unlikely to be side effect from lisinopril that she has been on for years. More likely related to very high BPs. Await BB response since he will be seeing her in office. Thanks.

## 2020-04-10 NOTE — Telephone Encounter (Addendum)
Pt states the medication Lisinopril-she is expeirncing all the side effects (dizziness/world spinning around her/headaches). She feels unsafe operating a vehicle while on this medication. She also states her R foot is numb. Pt would like an alternative or to speak to CMA   Pt can be reached at [phone]   Also Transferred to Nurse Triage

## 2020-04-11 ENCOUNTER — Ambulatory Visit (INDEPENDENT_AMBULATORY_CARE_PROVIDER_SITE_OTHER): Payer: Medicare HMO | Admitting: Family Medicine

## 2020-04-11 ENCOUNTER — Encounter: Payer: Self-pay | Admitting: Family Medicine

## 2020-04-11 VITALS — BP 146/74 | HR 85 | Temp 98.2°F | Wt 149.8 lb

## 2020-04-11 DIAGNOSIS — E042 Nontoxic multinodular goiter: Secondary | ICD-10-CM | POA: Diagnosis not present

## 2020-04-11 DIAGNOSIS — R42 Dizziness and giddiness: Secondary | ICD-10-CM | POA: Diagnosis not present

## 2020-04-11 DIAGNOSIS — I6502 Occlusion and stenosis of left vertebral artery: Secondary | ICD-10-CM

## 2020-04-11 DIAGNOSIS — I10 Essential (primary) hypertension: Secondary | ICD-10-CM

## 2020-04-11 DIAGNOSIS — D649 Anemia, unspecified: Secondary | ICD-10-CM | POA: Diagnosis not present

## 2020-04-11 LAB — BASIC METABOLIC PANEL
BUN: 43 mg/dL — ABNORMAL HIGH (ref 6–23)
CO2: 26 mEq/L (ref 19–32)
Calcium: 9.3 mg/dL (ref 8.4–10.5)
Chloride: 96 mEq/L (ref 96–112)
Creatinine, Ser: 1.43 mg/dL — ABNORMAL HIGH (ref 0.40–1.20)
GFR: 43.39 mL/min — ABNORMAL LOW (ref >60.00)
Glucose, Bld: 462 mg/dL — ABNORMAL HIGH (ref 70–99)
Potassium: 4.4 mEq/L (ref 3.5–5.1)
Sodium: 130 mEq/L — ABNORMAL LOW (ref 135–145)

## 2020-04-11 LAB — VITAMIN B12: Vitamin B-12: 1390 pg/mL — ABNORMAL HIGH (ref 211–911)

## 2020-04-11 LAB — FERRITIN: Ferritin: 94.7 ng/mL (ref 10.0–291.0)

## 2020-04-11 MED ORDER — AMLODIPINE BESYLATE 5 MG PO TABS: 5.0000 mg | ORAL_TABLET | Freq: Every day | ORAL | 5 refills | Status: DC

## 2020-04-11 NOTE — Patient Instructions (Signed)

## 2020-04-11 NOTE — Progress Notes (Signed)
Subjective:     Patient ID: Dana [name], female   DOB: 17-Mar-1946, 74 y.o.   MRN: [id number]  HPI Dana [name] seen with complains of some dizziness and headaches for past couple weeks.  She has type 2 diabetes and hypertension.  Her current medications include HCTZ, lisinopril, Ozempic.  She states symptoms occur more frequently in the morning and she tends to feel "off balance ".  No syncope.  No consistent orthostatic symptoms.  Symptoms tend to improve some through the day.  No true vertigo.  No recent chest pains.  Symptoms not triggered by neck extension.  She was evaluated in the ER on the seventh of this month.  She had evidence for moderate stenosis origin of the dominant left vertebral artery due to soft plaque.  There was mention of tiny 2 to 3 mm aneurysm tip of the basilar artery.  No large vessel occlusion.  Carotid vessels were normal.  There was also mention of multinodular goiter. No further imaging recommended for thyroid.   She had TSH which was normal.  Her CBC revealed hemoglobin 9.8.  This compared with hemoglobin 11.64 months earlier.  Her hemoglobin was down to 10.9 three months ago.  Also noted that she had creatinine 1.67 with BUN of 41.  This was increased from previous values with previous creatinine around 1.0  Blood pressure has remained elevated recently.  She states she has been compliant with medications.  She was concerned initially that her dizziness and headache may be related to medication.  Her headaches have been mostly right-sided and mild.  She had recent CNS imaging as above.  Past Medical History:  Diagnosis Date  . DM (diabetes mellitus), type 1 ([place])   . Hypertension    Past Surgical History:  Procedure Laterality Date  . HYSTERECTOMY ABDOMINAL WITH SALPINGO-OOPHORECTOMY      reports that she has never smoked. She has never used smokeless tobacco. She reports that she does not drink alcohol or use drugs. family history includes Diabetes in her  brother, brother, brother, mother, sister, and sister; Hypertension in her father and sister. Allergies  Allergen Reactions  . Clindamycin/Lincomycin   . Motrin [Ibuprofen]      Review of Systems  Constitutional: Negative for chills and fever.  Eyes: Negative for visual disturbance.  Respiratory: Negative for cough and shortness of breath.   Cardiovascular: Negative for chest pain.  Gastrointestinal: Negative for abdominal pain, nausea and vomiting.  Genitourinary: Negative for dysuria.  Skin: Negative for rash.  Neurological: Positive for dizziness and headaches. Negative for seizures, syncope, speech difficulty and weakness.  Psychiatric/Behavioral: Negative for confusion.       Objective:   Physical Exam Vitals reviewed.  Constitutional:      Appearance: Normal appearance.  HENT:     Head: Normocephalic and atraumatic.  Eyes:     Extraocular Movements: Extraocular movements intact.     Pupils: Pupils are equal, round, and reactive to light.  Cardiovascular:     Rate and Rhythm: Normal rate and regular rhythm.  Pulmonary:     Effort: Pulmonary effort is normal.     Breath sounds: Normal breath sounds.  Musculoskeletal:     Cervical back: Neck supple.     Right lower leg: No edema.     Left lower leg: No edema.  Lymphadenopathy:     Cervical: No cervical adenopathy.  Skin:    Findings: No rash.  Neurological:     General: No focal deficit present.  Mental Status: She is alert and oriented to person, place, and time.     Cranial Nerves: No cranial nerve deficit.     Motor: No weakness.     Coordination: Coordination normal.     Gait: Gait normal.  Psychiatric:        Mood and Affect: Mood normal.        Thought Content: Thought content normal.        Assessment:     #1 hypertension poorly controlled.  Seated blood pressure repeat left arm seated after rest 160/78 and standing 152/70  #2 recent nonspecific dizziness.  She is not describing clear  orthostatic symptoms.  Her symptoms sound more vestibular but no true vertigo.  She had evidence for some vertebral stenosis but doubt this is related to her dizziness.  We explained that we do NOT think her current BP medications are contributing to her dizziness.  #3 multinodular goiter by recent imaging and exam with normal TSH.  #4 normocytic anemia with some worsening over the past few months as above.  She does recall having IVFs prior to lab in ER and recent mild drop might be partly dilutional in setting of what looks like some chronic normocytic anemia.    Plan:     -Recheck CBC, basic metabolic panel, S28, ferritin, iron, TIBC  -We discussed addition of amlodipine 5 mg daily to tighten up her systolic control.  Continue the Lisinopril and HCTZ,    Stay well-hydrated  -We recommend follow-up with her primary within a few weeks to reassess blood pressure  Eulas Post MD [facility] at [facility]

## 2020-04-12 DIAGNOSIS — I10 Essential (primary) hypertension: Secondary | ICD-10-CM | POA: Insufficient documentation

## 2020-04-12 DIAGNOSIS — D649 Anemia, unspecified: Secondary | ICD-10-CM | POA: Insufficient documentation

## 2020-04-12 DIAGNOSIS — E042 Nontoxic multinodular goiter: Secondary | ICD-10-CM | POA: Insufficient documentation

## 2020-04-12 DIAGNOSIS — I6509 Occlusion and stenosis of unspecified vertebral artery: Secondary | ICD-10-CM | POA: Insufficient documentation

## 2020-04-12 LAB — CBC WITH DIFFERENTIAL/PLATELET
Basophils Absolute: 0 10*3/uL (ref 0.0–0.1)
Basophils Relative: 1 % (ref 0.0–3.0)
Eosinophils Absolute: 0.1 10*3/uL (ref 0.0–0.7)
Eosinophils Relative: 2.4 % (ref 0.0–5.0)
HCT: 29.6 % — ABNORMAL LOW (ref 36.0–46.0)
Hemoglobin: 10.2 g/dL — ABNORMAL LOW (ref 12.0–15.0)
Lymphocytes Relative: 37.8 % (ref 12.0–46.0)
Lymphs Abs: 1.9 10*3/uL (ref 0.7–4.0)
MCHC: 34.5 g/dL (ref 30.0–36.0)
MCV: 89.9 fl (ref 78.0–100.0)
Monocytes Absolute: 0.4 10*3/uL (ref 0.1–1.0)
Monocytes Relative: 8.4 % (ref 3.0–12.0)
Neutro Abs: 2.6 10*3/uL (ref 1.4–7.7)
Neutrophils Relative %: 50.4 % (ref 43.0–77.0)
Platelets: 240 10*3/uL (ref 150.0–400.0)
RBC: 3.29 Mil/uL — ABNORMAL LOW (ref 3.87–5.11)
RDW: 13.2 % (ref 11.5–15.5)
WBC: 5.1 10*3/uL (ref 4.0–10.5)

## 2020-04-12 LAB — IRON, TOTAL/TOTAL IRON BINDING CAP
%SAT: 27 % (calc) (ref 16–45)
Iron: 73 ug/dL (ref 45–160)
TIBC: 268 mcg/dL (calc) (ref 250–450)

## 2020-04-16 ENCOUNTER — Telehealth: Payer: Self-pay | Admitting: Internal Medicine

## 2020-04-16 NOTE — Telephone Encounter (Signed)
Noted, however they will need to contact the patient PCP.

## 2020-04-16 NOTE — Telephone Encounter (Signed)
Dana [name] with [employer] [employer] on behalf of [employer] ph# [phone] called to let Dr. Cruzita Lederer and her Nurse know that she is faxing a recommendation  form for moderate to high intensity statin.

## 2020-04-24 ENCOUNTER — Telehealth: Payer: Self-pay | Admitting: Internal Medicine

## 2020-04-24 DIAGNOSIS — E1159 Type 2 diabetes mellitus with other circulatory complications: Secondary | ICD-10-CM

## 2020-04-24 DIAGNOSIS — I152 Hypertension secondary to endocrine disorders: Secondary | ICD-10-CM

## 2020-04-24 MED ORDER — HYDROCHLOROTHIAZIDE 25 MG PO TABS: 25.0000 mg | ORAL_TABLET | Freq: Every day | ORAL | 1 refills | Status: DC

## 2020-04-24 MED ORDER — LISINOPRIL 10 MG PO TABS: 10.0000 mg | ORAL_TABLET | Freq: Every day | ORAL | 1 refills | Status: DC

## 2020-04-24 NOTE — Telephone Encounter (Signed)
Refill sent.

## 2020-04-24 NOTE — Telephone Encounter (Signed)
Pt is out of this medication and needs it refilled.   Medication Refill:  90-day supply (insurance purposes)  Hydrochlorothiazide Lisinopril   Pharmacy: [facility] [address]  FAX: [phone]

## 2020-04-24 NOTE — Addendum Note (Signed)
Addended by: Westley Hummer B on: 04/24/2020 11:45 AM   Modules accepted: Orders

## 2020-05-02 ENCOUNTER — Other Ambulatory Visit: Payer: Self-pay

## 2020-05-03 ENCOUNTER — Ambulatory Visit (INDEPENDENT_AMBULATORY_CARE_PROVIDER_SITE_OTHER): Payer: Medicare HMO | Admitting: Internal Medicine

## 2020-05-03 ENCOUNTER — Encounter: Payer: Self-pay | Admitting: Internal Medicine

## 2020-05-03 VITALS — BP 130/80 | HR 75 | Temp 97.0°F | Ht 63.0 in | Wt 153.9 lb

## 2020-05-03 DIAGNOSIS — I10 Essential (primary) hypertension: Secondary | ICD-10-CM

## 2020-05-03 DIAGNOSIS — Z794 Long term (current) use of insulin: Secondary | ICD-10-CM

## 2020-05-03 DIAGNOSIS — E1159 Type 2 diabetes mellitus with other circulatory complications: Secondary | ICD-10-CM

## 2020-05-03 DIAGNOSIS — E1121 Type 2 diabetes mellitus with diabetic nephropathy: Secondary | ICD-10-CM

## 2020-05-03 DIAGNOSIS — I152 Hypertension secondary to endocrine disorders: Secondary | ICD-10-CM

## 2020-05-03 LAB — POCT GLYCOSYLATED HEMOGLOBIN (HGB A1C): Hemoglobin A1C: 14.1 % — AB (ref 4.0–5.6)

## 2020-05-03 NOTE — Patient Instructions (Signed)
-  Nice seeing you today!!  -Please make sure you call Dr. Arman Filter office and f/u with her soon.

## 2020-05-03 NOTE — Progress Notes (Signed)
Established Patient Office Visit     This visit occurred during the SARS-CoV-2 public health emergency.  Safety protocols were in place, including screening questions prior to the visit, additional usage of staff PPE, and extensive cleaning of exam room while observing appropriate contact time as indicated for disinfecting solutions.    CC/Reason for Visit:  F/u BP  HPI: Dana [name] is a 74 y.o. female who is coming in today for the above mentioned reasons. Saw another provider in the office 3 weeks ago and norvasc 5 mh was added to HCTZ 25 and lisinopril 10 mg due to elevated BP. Coincidentally her A1c is 14.2 in office today. She saw Dr. Cruzita Lederer in Feb. She has no complaints today.   Past Medical/Surgical History: Past Medical History:  Diagnosis Date  . DM (diabetes mellitus), type 1 ([place])   . Hypertension     Past Surgical History:  Procedure Laterality Date  . HYSTERECTOMY ABDOMINAL WITH SALPINGO-OOPHORECTOMY      Social History:  reports that she has never smoked. She has never used smokeless tobacco. She reports that she does not drink alcohol or use drugs.  Allergies: Allergies  Allergen Reactions  . Clindamycin/Lincomycin   . Motrin [Ibuprofen]     Family History:  Family History  Problem Relation Age of Onset  . Diabetes Mother   . Hypertension Father   . Diabetes Sister   . Diabetes Brother   . Diabetes Sister   . Hypertension Sister   . Diabetes Brother   . Diabetes Brother      Current Outpatient Medications:  .  amLODipine (NORVASC) 5 MG tablet, Take 1 tablet (5 mg total) by mouth daily., Disp: 30 tablet, Rfl: 5 .  ASPIRIN 81 PO, Take by mouth., Disp: , Rfl:  .  hydrochlorothiazide (HYDRODIURIL) 25 MG tablet, Take 1 tablet (25 mg total) by mouth daily., Disp: 90 tablet, Rfl: 1 .  Insulin Detemir (LEVEMIR FLEXTOUCH) 100 UNIT/ML Pen, Inject 60 Units into the skin daily., Disp: 15 mL, Rfl: 5 .  lisinopril (ZESTRIL) 10 MG tablet, Take 1  tablet (10 mg total) by mouth daily., Disp: 90 tablet, Rfl: 1 .  Omega-3 Fatty Acids (FISH OIL PO), Take by mouth., Disp: , Rfl:  .  Semaglutide,0.25 or 0.5MG /DOS, (OZEMPIC, 0.25 OR 0.5 MG/DOSE,) 2 MG/1.5ML SOPN, Inject 0.5 mg into the skin once a week., Disp: 2 pen, Rfl: 5 .  TURMERIC PO, Take by mouth., Disp: , Rfl:   Review of Systems:  Constitutional: Denies fever, chills, diaphoresis, appetite change and fatigue.  HEENT: Denies photophobia, eye pain, redness, hearing loss, ear pain, congestion, sore throat, rhinorrhea, sneezing, mouth sores, trouble swallowing, neck pain, neck stiffness and tinnitus.   Respiratory: Denies SOB, DOE, cough, chest tightness,  and wheezing.   Cardiovascular: Denies chest pain, palpitations and leg swelling.  Gastrointestinal: Denies nausea, vomiting, abdominal pain, diarrhea, constipation, blood in stool and abdominal distention.  Genitourinary: Denies dysuria, urgency, frequency, hematuria, flank pain and difficulty urinating.  Endocrine: Denies: hot or cold intolerance, sweats, changes in hair or nails, polyuria, polydipsia. Musculoskeletal: Denies myalgias, back pain, joint swelling, arthralgias and gait problem.  Skin: Denies pallor, rash and wound.  Neurological: Denies dizziness, seizures, syncope, weakness, light-headedness, numbness and headaches.  Hematological: Denies adenopathy. Easy bruising, personal or family bleeding history  Psychiatric/Behavioral: Denies suicidal ideation, mood changes, confusion, nervousness, sleep disturbance and agitation    Physical Exam: Vitals:   05/03/20 1402  BP: 130/80  Pulse: 75  Temp: Marland Kitchen)  97 F (36.1 C)  TempSrc: Temporal  SpO2: 97%  Weight: 153 lb 14.4 oz (69.8 kg)  Height: 5\' 3"  (1.6 m)    Body mass index is 27.26 kg/m.   Constitutional: NAD, calm, comfortable Eyes: PERRL, lids and conjunctivae normal ENMT: Mucous membranes are moist.  Respiratory: clear to auscultation bilaterally, no wheezing,  no crackles. Normal respiratory effort. No accessory muscle use.  Cardiovascular: Regular rate and rhythm, no murmurs / rubs / gallops. No extremity edema. Neurologic: grossly intact and non-focal  Psychiatric: Normal judgment and insight. Alert and oriented x 3. Normal mood.    Impression and Plan:  Hypertension associated with diabetes ([place]) -improved control. Continue current meds. -F/u in 3 months.  Type 2 diabetes mellitus with diabetic nephropathy, with long-term current use of insulin (HCC) -Very uncontrolled with an A1c of 14.2 in office today. -Since she already follows with endocrine, will have her call for close follow up. -We have discussed diet modifications at length today. She has been eating a large amount of fruit, tea and oatmeal.    Patient Instructions  -Nice seeing you today!!  -Please make sure you call Dr. Arman Filter office and f/u with her soon.     Lelon Frohlich, MD [facility] at [facility]

## 2020-07-05 ENCOUNTER — Ambulatory Visit: Payer: Medicare HMO | Admitting: Podiatry

## 2020-07-05 ENCOUNTER — Encounter: Payer: Self-pay | Admitting: Podiatry

## 2020-07-05 ENCOUNTER — Other Ambulatory Visit: Payer: Self-pay

## 2020-07-05 DIAGNOSIS — B351 Tinea unguium: Secondary | ICD-10-CM | POA: Diagnosis not present

## 2020-07-05 DIAGNOSIS — M79675 Pain in left toe(s): Secondary | ICD-10-CM | POA: Diagnosis not present

## 2020-07-05 DIAGNOSIS — E1065 Type 1 diabetes mellitus with hyperglycemia: Secondary | ICD-10-CM

## 2020-07-05 DIAGNOSIS — E1022 Type 1 diabetes mellitus with diabetic chronic kidney disease: Secondary | ICD-10-CM

## 2020-07-05 DIAGNOSIS — M79674 Pain in right toe(s): Secondary | ICD-10-CM

## 2020-07-05 NOTE — Progress Notes (Signed)
This patient returns to my office for at risk foot care.  This patient requires this care by a professional since this patient will be at risk due to having diabetes type 1.  This patient is unable to cut nails herself since the patient cannot reach her nails .These nails are painful walking and wearing shoes. No other complaints noted today.  This patient presents for at risk foot care today.  General Appearance  Alert, conversant and in no acute stress.  Vascular  Dorsalis pedis and posterior tibial  pulses are palpable  bilaterally.  Capillary return is within normal limits  bilaterally. Temperature is within normal limits  bilaterally.  Neurologic  Senn-Weinstein monofilament wire test within normal limits  bilaterally. Muscle power within normal limits bilaterally.  Nails Thick disfigured discolored nails with subungual debris  from hallux to fifth toes bilaterally. No evidence of bacterial infection or drainage bilaterally.  Orthopedic  No limitations of motion  feet .  No crepitus or effusions noted.  No bony pathology or digital deformities noted.  Skin  normotropic skin with no porokeratosis noted bilaterally.  No signs of infections or ulcers noted.     Onychomycosis  Pain in right toes  Pain in left toes  Consent was obtained for treatment procedures.   Mechanical debridement of nails 1-5  bilaterally performed with a nail nipper.  Filed with dremel without incident.    Return office visit     3 months                 Told patient to return for periodic foot care and evaluation due to potential at risk complications.   Gardiner Barefoot DPM

## 2020-07-17 ENCOUNTER — Other Ambulatory Visit: Payer: Self-pay | Admitting: Internal Medicine

## 2020-07-17 DIAGNOSIS — I152 Hypertension secondary to endocrine disorders: Secondary | ICD-10-CM

## 2020-07-17 DIAGNOSIS — E1159 Type 2 diabetes mellitus with other circulatory complications: Secondary | ICD-10-CM

## 2020-08-24 ENCOUNTER — Telehealth: Payer: Self-pay | Admitting: Internal Medicine

## 2020-08-24 DIAGNOSIS — Z111 Encounter for screening for respiratory tuberculosis: Secondary | ICD-10-CM

## 2020-08-24 NOTE — Telephone Encounter (Signed)
Pt stated she is starting a new job and they are wanting a Tb test done however she does not get those anymore so they are requiring an x-ray of her lungs to show that it is clear. Pt is wondering if her PCP can recommend where she could go that will let her do this tomorrow so she could have it by Monday. She also wanted to know if her insurance will pay for it--I did inform her that she will need to contact her insurance to see if they will b/c we will not know that info.   Pt would like a call back at [phone]

## 2020-08-24 NOTE — Telephone Encounter (Signed)
Attempted to call the patient but the mailbox is full.

## 2020-08-24 NOTE — Telephone Encounter (Signed)
We can do a quantiferon gold that is a lab test.

## 2020-08-24 NOTE — Telephone Encounter (Signed)
Attempted to call patient.   Mailbox is full.

## 2020-08-24 NOTE — Telephone Encounter (Signed)
Pt was returning rachels call

## 2020-08-28 ENCOUNTER — Telehealth: Payer: Self-pay | Admitting: Internal Medicine

## 2020-08-28 NOTE — Telephone Encounter (Signed)
Pt is requesting an order for a chest X-ray of her chest for TB. She needs this for a job  Please advise

## 2020-08-29 NOTE — Telephone Encounter (Signed)
The patient is wanting a chest X-Ray for her TB test because she had a bad experience with the labs and skin test and was told to only do the X-Ray for testing for now on.  She was wanting to go before her Tuesday appointment, then decided that she will wait till after she see Dr. Jerilee Hoh.

## 2020-08-29 NOTE — Telephone Encounter (Signed)
Yes

## 2020-08-29 NOTE — Telephone Encounter (Signed)
See phone message 08/29/20

## 2020-08-29 NOTE — Addendum Note (Signed)
Addended by: Westley Hummer B on: 08/29/2020 11:52 AM   Modules accepted: Orders

## 2020-08-29 NOTE — Telephone Encounter (Signed)
Okay to order a xray?

## 2020-08-29 NOTE — Telephone Encounter (Signed)
Spoke with patient and she would like to go to [facility] for her chest xray.  Order placed and she will pick it up at her next appointment.

## 2020-08-29 NOTE — Telephone Encounter (Signed)
Left message on machine for patient to return our call 

## 2020-08-29 NOTE — Addendum Note (Signed)
Addended by: Westley Hummer B on: 08/29/2020 11:56 AM   Modules accepted: Orders

## 2020-09-03 ENCOUNTER — Other Ambulatory Visit: Payer: Self-pay

## 2020-09-04 ENCOUNTER — Ambulatory Visit: Payer: Medicare HMO | Admitting: Internal Medicine

## 2020-09-07 ENCOUNTER — Ambulatory Visit: Payer: Medicare HMO | Admitting: Internal Medicine

## 2020-09-18 ENCOUNTER — Ambulatory Visit (INDEPENDENT_AMBULATORY_CARE_PROVIDER_SITE_OTHER): Payer: Medicare HMO | Admitting: Internal Medicine

## 2020-09-18 ENCOUNTER — Other Ambulatory Visit: Payer: Self-pay

## 2020-09-18 ENCOUNTER — Encounter: Payer: Self-pay | Admitting: Internal Medicine

## 2020-09-18 VITALS — BP 170/90 | HR 70 | Temp 97.8°F | Wt 152.7 lb

## 2020-09-18 DIAGNOSIS — Z794 Long term (current) use of insulin: Secondary | ICD-10-CM | POA: Diagnosis not present

## 2020-09-18 DIAGNOSIS — E1121 Type 2 diabetes mellitus with diabetic nephropathy: Secondary | ICD-10-CM

## 2020-09-18 DIAGNOSIS — I10 Essential (primary) hypertension: Secondary | ICD-10-CM | POA: Diagnosis not present

## 2020-09-18 DIAGNOSIS — E042 Nontoxic multinodular goiter: Secondary | ICD-10-CM | POA: Diagnosis not present

## 2020-09-18 DIAGNOSIS — Z111 Encounter for screening for respiratory tuberculosis: Secondary | ICD-10-CM | POA: Diagnosis not present

## 2020-09-18 LAB — POCT GLYCOSYLATED HEMOGLOBIN (HGB A1C): Hemoglobin A1C: 13.3 % — AB (ref 4.0–5.6)

## 2020-09-18 MED ORDER — LEVEMIR FLEXTOUCH 100 UNIT/ML ~~LOC~~ SOPN: 60.0000 [IU] | PEN_INJECTOR | Freq: Every day | SUBCUTANEOUS | 5 refills | Status: DC

## 2020-09-18 MED ORDER — AMLODIPINE BESYLATE 10 MG PO TABS: 10.0000 mg | ORAL_TABLET | Freq: Every day | ORAL | 1 refills | Status: DC

## 2020-09-18 MED ORDER — OZEMPIC (0.25 OR 0.5 MG/DOSE) 2 MG/1.5ML ~~LOC~~ SOPN: 0.5000 mg | PEN_INJECTOR | SUBCUTANEOUS | 5 refills | Status: DC

## 2020-09-18 NOTE — Progress Notes (Signed)
Established Patient Office Visit     This visit occurred during the SARS-CoV-2 public health emergency.  Safety protocols were in place, including screening questions prior to the visit, additional usage of staff PPE, and extensive cleaning of exam room while observing appropriate contact time as indicated for disinfecting solutions.    CC/Reason for Visit: Follow-up chronic conditions, requesting TB screening  HPI: Dana [name] is a 74 y.o. female who is coming in today for the above mentioned reasons. Past Medical History is significant for: Uncontrolled hypertension and type 2 diabetes.  She is on Levemir and semaglutide for diabetes, she is supposed to be following endocrinology but is noncompliant.  She has made some healthy lifestyle changes.  She is hesitant to treat with medications and instead prefers to go with supplements.  She is starting a new job and is requesting a chest x-ray for TB screening.  She has no acute complaints today.   Past Medical/Surgical History: Past Medical History:  Diagnosis Date  . DM (diabetes mellitus), type 1 ([place])   . Hypertension     Past Surgical History:  Procedure Laterality Date  . HYSTERECTOMY ABDOMINAL WITH SALPINGO-OOPHORECTOMY      Social History:  reports that she has never smoked. She has never used smokeless tobacco. She reports that she does not drink alcohol and does not use drugs.  Allergies: Allergies  Allergen Reactions  . Clindamycin/Lincomycin   . Motrin [Ibuprofen]     Family History:  Family History  Problem Relation Age of Onset  . Diabetes Mother   . Hypertension Father   . Diabetes Sister   . Diabetes Brother   . Diabetes Sister   . Hypertension Sister   . Diabetes Brother   . Diabetes Brother      Current Outpatient Medications:  .  amLODipine (NORVASC) 10 MG tablet, Take 1 tablet (10 mg total) by mouth daily., Disp: 90 tablet, Rfl: 1 .  ASPIRIN 81 PO, Take by mouth., Disp: , Rfl:  .   hydrochlorothiazide (HYDRODIURIL) 25 MG tablet, TAKE 1 TABLET BY MOUTH EVERY DAY, Disp: 90 tablet, Rfl: 1 .  Insulin Detemir (LEVEMIR FLEXTOUCH) 100 UNIT/ML Pen, Inject 60 Units into the skin daily., Disp: 15 mL, Rfl: 5 .  lisinopril (ZESTRIL) 10 MG tablet, Take 1 tablet (10 mg total) by mouth daily., Disp: 90 tablet, Rfl: 1 .  Omega-3 Fatty Acids (FISH OIL PO), Take by mouth., Disp: , Rfl:  .  Semaglutide,0.25 or 0.5MG /DOS, (OZEMPIC, 0.25 OR 0.5 MG/DOSE,) 2 MG/1.5ML SOPN, Inject 0.5 mg into the skin once a week., Disp: 2 pen, Rfl: 5 .  TURMERIC PO, Take by mouth., Disp: , Rfl:   Review of Systems:  Constitutional: Denies fever, chills, diaphoresis, appetite change and fatigue.  HEENT: Denies photophobia, eye pain, redness, hearing loss, ear pain, congestion, sore throat, rhinorrhea, sneezing, mouth sores, trouble swallowing, neck pain, neck stiffness and tinnitus.   Respiratory: Denies SOB, DOE, cough, chest tightness,  and wheezing.   Cardiovascular: Denies chest pain, palpitations and leg swelling.  Gastrointestinal: Denies nausea, vomiting, abdominal pain, diarrhea, constipation, blood in stool and abdominal distention.  Genitourinary: Denies dysuria, urgency, frequency, hematuria, flank pain and difficulty urinating.  Endocrine: Denies: hot or cold intolerance, sweats, changes in hair or nails, polyuria, polydipsia. Musculoskeletal: Denies myalgias, back pain, joint swelling, arthralgias and gait problem.  Skin: Denies pallor, rash and wound.  Neurological: Denies dizziness, seizures, syncope, weakness, light-headedness, numbness and headaches.  Hematological: Denies adenopathy. Easy bruising, personal  or family bleeding history  Psychiatric/Behavioral: Denies suicidal ideation, mood changes, confusion, nervousness, sleep disturbance and agitation    Physical Exam: Vitals:   09/18/20 1441  BP: (!) 170/90  Pulse: 70  Temp: 97.8 F (36.6 C)  TempSrc: Oral  SpO2: 97%  Weight: 152 lb  11.2 oz (69.3 kg)    Body mass index is 27.05 kg/m.   Constitutional: NAD, calm, comfortable Eyes: PERRL, lids and conjunctivae normal ENMT: Mucous membranes are moist.  Respiratory: clear to auscultation bilaterally, no wheezing, no crackles. Normal respiratory effort. No accessory muscle use.  Cardiovascular: Regular rate and rhythm, no murmurs / rubs / gallops. No extremity edema.  Neurologic: Grossly intact and nonfocal Psychiatric: Normal judgment and insight. Alert and oriented x 3. Normal mood.    Impression and Plan:  Type 2 diabetes mellitus with diabetic nephropathy, with long-term current use of insulin (HCC) -A1c remains way above goal at 13.3 today although slightly improved from prior visit at 14.1. -Since she is already followed by endocrinology, have advised her to make urgent appointment to follow-up with them, for now she remains on Levemir 60 units daily and 0.5 mg weekly of semaglutide.  Essential hypertension  -Uncontrolled. -Increase Norvasc from 5 to 10 mg daily.  Continue lisinopril 10 and hydrochlorothiazide 25.  Multinodular goiter  - Plan: TSH -Last TSH was above goal at 0.093.  Screening for tuberculosis  - Plan: QuantiFERON-TB Gold Plus    Patient Instructions  -Nice seeing you today!!  -Lab work today; will notify you once results are available.  -Schedule follow up as soon as possible for your physical. Please come in fasting that day.  -increase amlodipine to 10 mg daily.  -Make sure you arrange close follow up with your endocrinologist.     Lelon Frohlich, MD [facility] at [facility]

## 2020-09-18 NOTE — Patient Instructions (Signed)
-  Nice seeing you today!!  -Lab work today; will notify you once results are available.  -Schedule follow up as soon as possible for your physical. Please come in fasting that day.  -increase amlodipine to 10 mg daily.  -Make sure you arrange close follow up with your endocrinologist.

## 2020-09-18 NOTE — Addendum Note (Signed)
Addended by: Westley Hummer B on: 09/18/2020 04:12 PM   Modules accepted: Orders

## 2020-09-19 LAB — TSH: TSH: 0.99 mIU/L (ref 0.40–4.50)

## 2020-09-20 LAB — QUANTIFERON-TB GOLD PLUS

## 2020-09-21 ENCOUNTER — Telehealth: Payer: Self-pay | Admitting: Internal Medicine

## 2020-09-21 DIAGNOSIS — Z111 Encounter for screening for respiratory tuberculosis: Secondary | ICD-10-CM

## 2020-09-21 NOTE — Telephone Encounter (Signed)
EH-I spoke to patient/she is going to hydrate well over the weekend (as we had a hard time even getting her blood when she was here) and will go to a [place] location in [place] on Monday to have this drawn/I have put in the future order as well/thx dmf

## 2020-09-21 NOTE — Telephone Encounter (Signed)
Thanks

## 2020-10-11 ENCOUNTER — Ambulatory Visit: Payer: Medicare HMO | Admitting: Podiatry

## 2020-10-18 ENCOUNTER — Ambulatory Visit: Payer: Medicare HMO | Admitting: Podiatry

## 2020-10-18 ENCOUNTER — Other Ambulatory Visit: Payer: Self-pay

## 2020-10-18 ENCOUNTER — Encounter: Payer: Self-pay | Admitting: Podiatry

## 2020-10-18 DIAGNOSIS — M79675 Pain in left toe(s): Secondary | ICD-10-CM

## 2020-10-18 DIAGNOSIS — M79674 Pain in right toe(s): Secondary | ICD-10-CM

## 2020-10-18 DIAGNOSIS — E1065 Type 1 diabetes mellitus with hyperglycemia: Secondary | ICD-10-CM | POA: Diagnosis not present

## 2020-10-18 DIAGNOSIS — B351 Tinea unguium: Secondary | ICD-10-CM | POA: Diagnosis not present

## 2020-10-18 NOTE — Progress Notes (Signed)
This patient returns to my office for at risk foot care.  This patient requires this care by a professional since this patient will be at risk due to having diabetes type 1.  This patient is unable to cut nails herself since the patient cannot reach her nails .These nails are painful walking and wearing shoes. No other complaints noted today.  This patient presents for at risk foot care today.  General Appearance  Alert, conversant and in no acute stress.  Vascular  Dorsalis pedis and posterior tibial  pulses are palpable  bilaterally.  Capillary return is within normal limits  bilaterally. Temperature is within normal limits  bilaterally.  Neurologic  Senn-Weinstein monofilament wire test within normal limits  bilaterally. Muscle power within normal limits bilaterally.  Nails Thick disfigured discolored nails with subungual debris  from hallux to fifth toes bilaterally. No evidence of bacterial infection or drainage bilaterally.  Orthopedic  No limitations of motion  feet .  No crepitus or effusions noted.  No bony pathology or digital deformities noted.  Skin  normotropic skin with no porokeratosis noted bilaterally.  No signs of infections or ulcers noted.     Onychomycosis  Pain in right toes  Pain in left toes  Consent was obtained for treatment procedures.   Mechanical debridement of nails 1-5  bilaterally performed with a nail nipper.  Filed with dremel without incident.    Return office visit     3 months                 Told patient to return for periodic foot care and evaluation due to potential at risk complications.   Gardiner Barefoot DPM

## 2020-10-29 ENCOUNTER — Other Ambulatory Visit: Payer: Self-pay | Admitting: Family Medicine

## 2020-10-29 DIAGNOSIS — I10 Essential (primary) hypertension: Secondary | ICD-10-CM

## 2020-10-30 ENCOUNTER — Other Ambulatory Visit: Payer: Self-pay | Admitting: Internal Medicine

## 2020-10-30 DIAGNOSIS — E1159 Type 2 diabetes mellitus with other circulatory complications: Secondary | ICD-10-CM

## 2020-10-30 DIAGNOSIS — I152 Hypertension secondary to endocrine disorders: Secondary | ICD-10-CM

## 2021-01-21 ENCOUNTER — Other Ambulatory Visit: Payer: Self-pay

## 2021-01-21 ENCOUNTER — Encounter: Payer: Self-pay | Admitting: Podiatry

## 2021-01-21 ENCOUNTER — Ambulatory Visit: Payer: HMO | Admitting: Podiatry

## 2021-01-21 DIAGNOSIS — I739 Peripheral vascular disease, unspecified: Secondary | ICD-10-CM | POA: Diagnosis not present

## 2021-01-21 DIAGNOSIS — M79674 Pain in right toe(s): Secondary | ICD-10-CM

## 2021-01-21 DIAGNOSIS — B351 Tinea unguium: Secondary | ICD-10-CM | POA: Diagnosis not present

## 2021-01-21 DIAGNOSIS — E1065 Type 1 diabetes mellitus with hyperglycemia: Secondary | ICD-10-CM | POA: Diagnosis not present

## 2021-01-21 DIAGNOSIS — M79675 Pain in left toe(s): Secondary | ICD-10-CM

## 2021-01-21 NOTE — Progress Notes (Signed)
This patient returns to my office for at risk foot care.  This patient requires this care by a professional since this patient will be at risk due to having diabetes type 1.  This patient is unable to cut nails herself since the patient cannot reach her nails .These nails are painful walking and wearing shoes. No other complaints noted today.  This patient presents for at risk foot care today.  General Appearance  Alert, conversant and in no acute stress.  Vascular  Dorsalis pedis and posterior tibial  pulses are  Weakly/absent palpable  bilaterally.  Capillary return is within normal limits  bilaterally. Cold feet. Bilaterally. Absent digital hair.  Neurologic  Senn-Weinstein monofilament wire test within normal limits  bilaterally. Muscle power within normal limits bilaterally.  Nails Thick disfigured discolored nails with subungual debris  from hallux to fifth toes bilaterally. No evidence of bacterial infection or drainage bilaterally.  Orthopedic  No limitations of motion  feet .  No crepitus or effusions noted.  No bony pathology or digital deformities noted.  Skin  normotropic skin with no porokeratosis noted bilaterally.  No signs of infections or ulcers noted.     Onychomycosis  Pain in right toes  Pain in left toes  Consent was obtained for treatment procedures.   Mechanical debridement of nails 1-5  bilaterally performed with a nail nipper.  Filed with dremel without incident. To schedule vascular consult for decreased pulses.   Return office visit     3 months                 Told patient to return for periodic foot care and evaluation due to potential at risk complications.   Gardiner Barefoot DPM

## 2021-02-18 ENCOUNTER — Other Ambulatory Visit: Payer: Self-pay

## 2021-02-18 ENCOUNTER — Telehealth: Payer: Self-pay

## 2021-02-18 DIAGNOSIS — E1065 Type 1 diabetes mellitus with hyperglycemia: Secondary | ICD-10-CM

## 2021-02-18 DIAGNOSIS — I739 Peripheral vascular disease, unspecified: Secondary | ICD-10-CM

## 2021-02-18 NOTE — Progress Notes (Signed)
Referral sent to [facility] [place]

## 2021-02-18 NOTE — Telephone Encounter (Signed)
-----   Message from Gardiner Barefoot, DPM sent at 01/21/2021 11:32 AM EST ----- Patient would need arterial study done due to diminished pulses and cold feet and DM.

## 2021-02-18 NOTE — Telephone Encounter (Signed)
Orders and referral sent to [facility] [place]

## 2021-02-22 ENCOUNTER — Other Ambulatory Visit: Payer: Self-pay | Admitting: Podiatry

## 2021-02-22 DIAGNOSIS — I739 Peripheral vascular disease, unspecified: Secondary | ICD-10-CM

## 2021-03-05 ENCOUNTER — Ambulatory Visit: Payer: Medicare HMO | Admitting: Cardiovascular Disease

## 2021-03-21 ENCOUNTER — Ambulatory Visit: Payer: HMO | Admitting: Cardiovascular Disease

## 2021-03-22 ENCOUNTER — Encounter: Payer: Self-pay | Admitting: Cardiovascular Disease

## 2021-03-27 ENCOUNTER — Encounter: Payer: Self-pay | Admitting: Emergency Medicine

## 2021-03-27 ENCOUNTER — Emergency Department
Admission: EM | Admit: 2021-03-27 | Discharge: 2021-03-27 | Disposition: A | Discharge status: Home / Self Care | Payer: HMO | Attending: Emergency Medicine | Admitting: Emergency Medicine

## 2021-03-27 ENCOUNTER — Other Ambulatory Visit: Payer: Self-pay

## 2021-03-27 DIAGNOSIS — M542 Cervicalgia: Secondary | ICD-10-CM | POA: Diagnosis not present

## 2021-03-27 DIAGNOSIS — R519 Headache, unspecified: Secondary | ICD-10-CM | POA: Diagnosis not present

## 2021-03-27 DIAGNOSIS — Z794 Long term (current) use of insulin: Secondary | ICD-10-CM | POA: Insufficient documentation

## 2021-03-27 DIAGNOSIS — I10 Essential (primary) hypertension: Secondary | ICD-10-CM | POA: Diagnosis not present

## 2021-03-27 DIAGNOSIS — M62838 Other muscle spasm: Secondary | ICD-10-CM | POA: Diagnosis not present

## 2021-03-27 DIAGNOSIS — E1140 Type 2 diabetes mellitus with diabetic neuropathy, unspecified: Secondary | ICD-10-CM | POA: Diagnosis not present

## 2021-03-27 DIAGNOSIS — Z7982 Long term (current) use of aspirin: Secondary | ICD-10-CM | POA: Diagnosis not present

## 2021-03-27 DIAGNOSIS — Z79899 Other long term (current) drug therapy: Secondary | ICD-10-CM | POA: Diagnosis not present

## 2021-03-27 DIAGNOSIS — M25511 Pain in right shoulder: Secondary | ICD-10-CM | POA: Diagnosis not present

## 2021-03-27 DIAGNOSIS — R531 Weakness: Secondary | ICD-10-CM | POA: Diagnosis not present

## 2021-03-27 LAB — BASIC METABOLIC PANEL
Anion gap: 6 (ref 5–15)
BUN: 40 mg/dL — ABNORMAL HIGH (ref 8–23)
CO2: 20 mmol/L — ABNORMAL LOW (ref 22–32)
Calcium: 8.7 mg/dL — ABNORMAL LOW (ref 8.9–10.3)
Chloride: 111 mmol/L (ref 98–111)
Creatinine, Ser: 1.81 mg/dL — ABNORMAL HIGH (ref 0.44–1.00)
GFR, Estimated: 29 mL/min — ABNORMAL LOW (ref >60)
Glucose, Bld: 219 mg/dL — ABNORMAL HIGH (ref 70–99)
Potassium: 4.3 mmol/L (ref 3.5–5.1)
Sodium: 137 mmol/L (ref 135–145)

## 2021-03-27 LAB — CBC
HCT: 26.5 % — ABNORMAL LOW (ref 36.0–46.0)
Hemoglobin: 8.6 g/dL — ABNORMAL LOW (ref 12.0–15.0)
MCH: 29 pg (ref 26.0–34.0)
MCHC: 32.5 g/dL (ref 30.0–36.0)
MCV: 89.2 fL (ref 80.0–100.0)
Platelets: 229 10*3/uL (ref 150–400)
RBC: 2.97 MIL/uL — ABNORMAL LOW (ref 3.87–5.11)
RDW: 13 % (ref 11.5–15.5)
WBC: 6.2 10*3/uL (ref 4.0–10.5)
nRBC: 0 % (ref 0.0–0.2)

## 2021-03-27 MED ORDER — AMLODIPINE BESYLATE 5 MG PO TABS
10.0000 mg | ORAL_TABLET | Freq: Once | ORAL | Status: AC
  Administered 2021-03-27: 10 mg via ORAL
  Filled 2021-03-27: qty 2

## 2021-03-27 MED ORDER — HYDROCHLOROTHIAZIDE 25 MG PO TABS
25.0000 mg | ORAL_TABLET | Freq: Once | ORAL | Status: AC
  Administered 2021-03-27: 25 mg via ORAL
  Filled 2021-03-27: qty 1

## 2021-03-27 MED ORDER — LACTATED RINGERS IV BOLUS: 1000.0000 mL | Freq: Once | INTRAVENOUS | Status: DC

## 2021-03-27 MED ORDER — ACETAMINOPHEN 500 MG PO TABS: 1000.0000 mg | ORAL_TABLET | Freq: Once | ORAL | Status: DC

## 2021-03-27 MED ORDER — LIDOCAINE HCL (PF) 1 % IJ SOLN
5.0000 mL | Freq: Once | INTRAMUSCULAR | Status: AC
  Administered 2021-03-27: 5 mL
  Filled 2021-03-27: qty 5

## 2021-03-27 MED ORDER — LISINOPRIL 10 MG PO TABS
10.0000 mg | ORAL_TABLET | Freq: Once | ORAL | Status: DC
  Filled 2021-03-27: qty 1

## 2021-03-27 NOTE — ED Notes (Signed)
Pt comes out asking about leaving due to "not having a full meal all day". Pt informed that we have food here. Refuses our food. States a taxi is coming to get her.

## 2021-03-27 NOTE — ED Triage Notes (Addendum)
Pt comes into the ED via POV c/o weakness and feeling a little more off balance than normal.  Pt also feels like she has a "blood clot" in the right side of her neck.  When patient was asked what symptoms she was having with it, she explained "It feels like a knot is moving up and down on the right side of my neck".  Pt denies any one sided weakness, has symmetrical smile, grips, etc.  Pt has clear speech at this time and is neurologically intact. Pt took 5 baby ASA at home.

## 2021-03-27 NOTE — ED Provider Notes (Signed)
[facility] Emergency Department Provider Note ____________________________________________   Event Date/Time   First MD Initiated Contact with Patient 03/27/21 1542     (approximate)  I have reviewed the triage vital signs and the nursing notes.  HISTORY  Chief Complaint Weakness   HPI Dana [name] is a 75 y.o. femalewho presents to the ED for evaluation of right sided neck cramping and concern for stroke.   Chart review indicates hx DM, HTN. She lives at home with her husband and ambulates independently.  Patient presents to the ED via POV due to concern for stroke.  She reports that she has had multiple family members have strokes in their 38s and "I am not going to take that risk."  Patient reports feeling normal yesterday and this morning when she woke up, she reports being seated this afternoon around lunchtime when she developed a cramping pain in her right neck that radiated upwards from her shoulder to her right-sided occiput.  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   She reports this cramping sensation to the right side of her neck lasted up to 30 minutes and then self resolved.  She reports she feels fine now and is concerned of the possibility of stroke.  Further reporting that she has right-sided neck pain at the site of her previous cramping sensation when she turns her head to the right, and that is new today.  She denies any headache, syncopal episodes, vision changes, dizziness, focal weakness.  She reports that she felt off balance 1 time yesterday.   She has not taken any of her medications yet today.   Past Medical History:  Diagnosis Date  . DM (diabetes mellitus), type 1 ([place])   . Hypertension     Patient Active Problem List   Diagnosis Date  Noted  . PVD (peripheral vascular disease) ([place]) 01/21/2021  . Hypertension 04/12/2020  . Multinodular goiter 04/12/2020  . Normocytic anemia 04/12/2020  . Vertebral artery stenosis 04/12/2020  . Type 2 diabetes mellitus with diabetic nephropathy, with long-term current use of insulin ([place]) 01/25/2020  . DM (diabetes mellitus), type 1 ([place])     Past Surgical History:  Procedure Laterality Date  . HYSTERECTOMY ABDOMINAL WITH SALPINGO-OOPHORECTOMY      Prior to Admission medications   Medication Sig Start Date End Date Taking? Authorizing Provider  amLODipine (NORVASC) 10 MG tablet Take 1 tablet (10 mg total) by mouth daily. 09/18/20   Isaac Bliss, Rayford Halsted, MD  amLODipine (NORVASC) 5 MG tablet TAKE 1 TABLET BY MOUTH EVERY DAY 10/29/20   Isaac Bliss, Rayford Halsted, MD  ASPIRIN 81 PO Take by mouth.    [provider]  hydrochlorothiazide (HYDRODIURIL) 25 MG tablet TAKE 1 TABLET BY MOUTH EVERY DAY 07/17/20   Isaac Bliss, Rayford Halsted, MD  insulin detemir (LEVEMIR FLEXTOUCH) 100 UNIT/ML FlexPen Inject 60 Units into the skin daily. 09/18/20   Isaac Bliss, Rayford Halsted, MD  lisinopril (ZESTRIL) 10 MG tablet TAKE 1 TABLET BY MOUTH EVERY DAY 10/30/20   Isaac Bliss, Rayford Halsted, MD  Omega-3 Fatty Acids (FISH OIL PO) Take by mouth.    [provider]  Semaglutide,0.25 or 0.'5MG'$ /DOS, (OZEMPIC, 0.25 OR 0.5 MG/DOSE,) 2 MG/1.5ML SOPN Inject 0.5 mg into the skin once a week. 09/18/20  Isaac Bliss, Rayford Halsted, MD  TURMERIC PO Take by mouth.    [provider]    Allergies Clindamycin/lincomycin and Motrin [ibuprofen]  Family History  Problem Relation Age of Onset  . Diabetes Mother   . Hypertension Father   . Diabetes Sister   . Diabetes Brother   . Diabetes Sister   . Hypertension Sister   . Diabetes Brother   . Diabetes Brother     Social History Social History   Tobacco Use  . Smoking status: Never Smoker  . Smokeless tobacco: Never Used  Vaping Use   . Vaping Use: Never used  Substance Use Topics  . Alcohol use: Never  . Drug use: Never    Review of Systems  Constitutional: No fever/chills Eyes: No visual changes. ENT: No sore throat. Cardiovascular: Denies chest pain. Respiratory: Denies shortness of breath. Gastrointestinal: No abdominal pain.  No nausea, no vomiting.  No diarrhea.  No constipation. Genitourinary: Negative for dysuria. Musculoskeletal: Negative for back pain.  Right-sided neck pain, atraumatic. Skin: Negative for rash. Neurological: Negative for headaches, focal weakness or numbness.  ____________________________________________   PHYSICAL EXAM:  VITAL SIGNS: Vitals:   03/27/21 1630 03/27/21 1747  BP: (!) 164/80 (!) 191/89  Pulse: 76 81  Resp: 18   Temp:    SpO2: 100% 98%     Constitutional: Alert and oriented. Well appearing and in no acute distress.  Able to swing her legs over and stand from the stretcher independently and ambulate around her bed in the room. Eyes: Conjunctivae are normal. PERRL. EOMI. Head: Atraumatic. Nose: No congestion/rhinnorhea. Mouth/Throat: Mucous membranes are moist.  Oropharynx non-erythematous. Neck: No stridor. No cervical spine tenderness to palpation. Cardiovascular: Normal rate, regular rhythm. Grossly normal heart sounds.  Good peripheral circulation. Respiratory: Normal respiratory effort.  No retractions. Lungs CTAB. Gastrointestinal: Soft , nondistended, nontender to palpation. No CVA tenderness. Musculoskeletal: No lower extremity tenderness nor edema.  No joint effusions. No signs of acute trauma. Tenderness to palpation of her right-sided trapezius musculature, extending superiorly into the paraspinal right-sided cervical musculature.  Palpation at the sites reproduces her symptoms Neurologic:  Normal speech and language. No gross focal neurologic deficits are appreciated. No gait instability noted. Cranial nerves II through XII intact 5/5 strength and  sensation in all 4 extremities Skin:  Skin is warm, dry and intact. No rash noted. Psychiatric: Mood and affect are normal. Speech and behavior are normal.  ____________________________________________   LABS (all labs ordered are listed, but only abnormal results are displayed)  Labs Reviewed  BASIC METABOLIC PANEL - Abnormal; Notable for the following components:      Result Value   CO2 20 (*)    Glucose, Bld 219 (*)    BUN 40 (*)    Creatinine, Ser 1.81 (*)    Calcium 8.7 (*)    GFR, Estimated 29 (*)    All other components within normal limits  CBC - Abnormal; Notable for the following components:   RBC 2.97 (*)    Hemoglobin 8.6 (*)    HCT 26.5 (*)    All other components within normal limits  URINALYSIS, COMPLETE (UACMP) WITH MICROSCOPIC   ____________________________________________  12 Lead EKG  Rhythm, rate of 82 bpm.  Normal axis and intervals.  Some stigmata of LVH.  T wave inversions isolated to aVL and otherwise no evidence of acute ischemia.  ____________________________________________   PROCEDURES and INTERVENTIONS  Procedure(s) performed (including Critical Care):  .1-3 Lead EKG Interpretation Performed by: Tamala Julian,  Camillia Herter, MD Authorized by: Vladimir Crofts, MD     Interpretation: normal     ECG rate:  80   ECG rate assessment: normal     Rhythm: sinus rhythm     Ectopy: none     Conduction: normal     Trigger point injection After discussing risks versus benefits, patient is agreeable to proceed with trigger point injection for treatment of acute muscular spasm. Risks discussed include worsening pain, infection, neurovascular damage and pneumothorax.  Point of maximal tenderness was identified at inferior portion of right-sided paraspinal cervical musculature Overlying skin was cleaned with alcohol swabs. 1% lidocaine without epinephrine was incrementally injected, after confirming negative drawback, into this musculature. Total of 2 milliliters  injected. Well-tolerated without any apparent complications.   Medications  lisinopril (ZESTRIL) tablet 10 mg (10 mg Oral Patient Refused/Not Given 03/27/21 1649)  amLODipine (NORVASC) tablet 10 mg (10 mg Oral Given 03/27/21 1649)  hydrochlorothiazide (HYDRODIURIL) tablet 25 mg (25 mg Oral Given 03/27/21 1649)  lidocaine (PF) (XYLOCAINE) 1 % injection 5 mL (5 mLs Infiltration Given by Other 03/27/21 1721)    ____________________________________________   MDM / ED COURSE   75 year old woman with history of HTN and DM presents to the ED with acute right-sided neck pain, with evidence of muscular spasm, without evidence of additional acute pathology, and amenable to outpatient management.  She presents hypertensive, for which she receives her home antihypertensive medications that she has not had yet, and otherwise normal vitals on room air.  Exam is reassuring without evidence of neurovascular deficits, distress or any trauma.  She does have tenderness along her right-sided trapezius musculature and right-sided paraspinal cervical musculature.  No signs of trauma to the back, no midline spinal tenderness, and no meningismus to suggest encephalitis or meningitis.  Her blood work demonstrates CKD near baseline, hyperglycemia without acidosis and chronic anemia.  Her EKG is nonischemic.  Perform trigger point injection to her site of pain with resolution of her symptoms and she subsequently called a cab and demands discharge.  I see no evidence of stroke or additional cervical she has no history of blood clots and I see no JVD or evidence of cervical pathology beyond muscular tenderness and pain.  We discussed return precautions for the ED prior to discharge and patient stable for outpatient management.  Clinical Course as of 03/27/21 1847  Wed Mar 27, 2021  1721 Trigger point junction performed.  Well-tolerated.  2 cc of plain lidocaine injected [DS]  1753 Patient ambulates out of her room independently and  requests discharge.  I returned to the bedside, and she reports resolution of her neck pain.  She reports that she is ready to go home.  We discussed return precautions for the ED and patient reports understanding and agreement. [DS]    Clinical Course User Index [DS] Vladimir Crofts, MD    ____________________________________________   FINAL CLINICAL IMPRESSION(S) / ED DIAGNOSES  Final diagnoses:  Trapezius muscle spasm     ED Discharge Orders    None       Zuriah Bordas Tamala Julian   Note:  This document was prepared using Dragon voice recognition software and may include unintentional dictation errors.   Vladimir Crofts, MD 03/27/21 [phone]

## 2021-03-27 NOTE — ED Notes (Signed)
Pt comes from [facility] with c/o right neck pain. Pt states dizziness and not sure if she had a stroke. No stroke symptoms at this time. Pt took 5 aspirin this am.

## 2021-03-27 NOTE — ED Notes (Signed)
Pt refused IV and fluids. Dr Tamala Julian at bedside

## 2021-03-27 NOTE — Discharge Instructions (Addendum)
Use Tylenol for pain and fevers.  Up to 1000 mg per dose, up to 4 times per day.  Do not take more than 4000 mg of Tylenol/acetaminophen within 24 hours..  If you develop any strokelike symptoms, fevers with your symptoms, passing out with your symptoms, please return to the ED.

## 2021-04-12 ENCOUNTER — Encounter: Payer: Self-pay | Admitting: Podiatry

## 2021-04-20 ENCOUNTER — Other Ambulatory Visit: Payer: Self-pay | Admitting: Internal Medicine

## 2021-04-20 DIAGNOSIS — I10 Essential (primary) hypertension: Secondary | ICD-10-CM

## 2021-04-22 ENCOUNTER — Other Ambulatory Visit: Payer: Self-pay | Admitting: Internal Medicine

## 2021-04-22 ENCOUNTER — Ambulatory Visit: Payer: HMO | Admitting: Podiatry

## 2021-04-22 DIAGNOSIS — E1159 Type 2 diabetes mellitus with other circulatory complications: Secondary | ICD-10-CM

## 2021-04-22 DIAGNOSIS — I10 Essential (primary) hypertension: Secondary | ICD-10-CM

## 2021-05-01 ENCOUNTER — Other Ambulatory Visit: Payer: Self-pay | Admitting: Podiatry

## 2021-05-01 DIAGNOSIS — I739 Peripheral vascular disease, unspecified: Secondary | ICD-10-CM

## 2021-05-23 ENCOUNTER — Other Ambulatory Visit: Payer: Self-pay | Admitting: Internal Medicine

## 2021-05-23 DIAGNOSIS — I10 Essential (primary) hypertension: Secondary | ICD-10-CM

## 2021-05-23 DIAGNOSIS — I152 Hypertension secondary to endocrine disorders: Secondary | ICD-10-CM

## 2021-05-23 DIAGNOSIS — E1159 Type 2 diabetes mellitus with other circulatory complications: Secondary | ICD-10-CM

## 2021-05-27 ENCOUNTER — Other Ambulatory Visit: Payer: Self-pay | Admitting: Internal Medicine

## 2021-07-03 ENCOUNTER — Other Ambulatory Visit: Payer: Self-pay | Admitting: Internal Medicine

## 2021-07-03 ENCOUNTER — Other Ambulatory Visit (HOSPITAL_COMMUNITY): Payer: Self-pay | Admitting: Internal Medicine

## 2021-07-03 DIAGNOSIS — N1832 Chronic kidney disease, stage 3b: Secondary | ICD-10-CM

## 2021-07-26 ENCOUNTER — Ambulatory Visit: Payer: HMO

## 2021-08-02 ENCOUNTER — Other Ambulatory Visit: Payer: Self-pay | Admitting: Internal Medicine

## 2021-09-01 ENCOUNTER — Other Ambulatory Visit: Payer: Self-pay | Admitting: Internal Medicine

## 2021-09-10 ENCOUNTER — Other Ambulatory Visit: Payer: Self-pay

## 2021-09-10 ENCOUNTER — Emergency Department: Payer: HMO

## 2021-09-10 ENCOUNTER — Emergency Department
Admission: EM | Admit: 2021-09-10 | Discharge: 2021-09-10 | Disposition: A | Discharge status: Home / Self Care | Payer: HMO | Attending: Emergency Medicine | Admitting: Emergency Medicine

## 2021-09-10 ENCOUNTER — Encounter: Payer: Self-pay | Admitting: Emergency Medicine

## 2021-09-10 DIAGNOSIS — Y939 Activity, unspecified: Secondary | ICD-10-CM | POA: Diagnosis not present

## 2021-09-10 DIAGNOSIS — I10 Essential (primary) hypertension: Secondary | ICD-10-CM | POA: Insufficient documentation

## 2021-09-10 DIAGNOSIS — Z794 Long term (current) use of insulin: Secondary | ICD-10-CM | POA: Diagnosis not present

## 2021-09-10 DIAGNOSIS — Z7982 Long term (current) use of aspirin: Secondary | ICD-10-CM | POA: Insufficient documentation

## 2021-09-10 DIAGNOSIS — M7062 Trochanteric bursitis, left hip: Secondary | ICD-10-CM | POA: Insufficient documentation

## 2021-09-10 DIAGNOSIS — E119 Type 2 diabetes mellitus without complications: Secondary | ICD-10-CM | POA: Insufficient documentation

## 2021-09-10 DIAGNOSIS — Z79899 Other long term (current) drug therapy: Secondary | ICD-10-CM | POA: Diagnosis not present

## 2021-09-10 DIAGNOSIS — M25552 Pain in left hip: Secondary | ICD-10-CM | POA: Diagnosis present

## 2021-09-10 MED ORDER — PREDNISONE 10 MG PO TABS: 10.0000 mg | ORAL_TABLET | Freq: Every day | ORAL | 0 refills | Status: DC

## 2021-09-10 MED ORDER — ACETAMINOPHEN 500 MG PO TABS
1000.0000 mg | ORAL_TABLET | Freq: Once | ORAL | Status: AC
  Administered 2021-09-10: 1000 mg via ORAL
  Filled 2021-09-10: qty 2

## 2021-09-10 NOTE — ED Notes (Signed)
Pt NAD, a/ox4. Pt verbalizes understanding of all DC and f/u instructions. All questions answered. Pt wheeled to lobby in w/c

## 2021-09-10 NOTE — ED Provider Notes (Signed)
[facility] EMERGENCY DEPARTMENT Provider Note   CSN: [id number] Arrival date & time: 09/10/21  1959     History Chief Complaint  Patient presents with   Hip Pain    Dana [name] is a 75 y.o. female presents to the emergency department for evaluation of left hip pain.  She said several weeks of left hip pain with no falls trauma or injury.  Pain located on the lateral aspect of the hip along the trochanteric bursa with no burning numbness or tingling.  She denies any groin or thigh pain.  No back pain.  No fevers or swelling.  She is ambulatory.  Pain is worse with lying on the hip at nighttime.  HPI     Past Medical History:  Diagnosis Date   DM (diabetes mellitus), type 1 ([place])    Hypertension     Patient Active Problem List   Diagnosis Date Noted   PVD (peripheral vascular disease) ([place]) 01/21/2021   Hypertension 04/12/2020   Multinodular goiter 04/12/2020   Normocytic anemia 04/12/2020   Vertebral artery stenosis 04/12/2020   Type 2 diabetes mellitus with diabetic nephropathy, with long-term current use of insulin ([place]) 01/25/2020   DM (diabetes mellitus), type 1 ([place])     Past Surgical History:  Procedure Laterality Date   HYSTERECTOMY ABDOMINAL WITH SALPINGO-OOPHORECTOMY       OB History   No obstetric history on file.     Family History  Problem Relation Age of Onset   Diabetes Mother    Hypertension Father    Diabetes Sister    Diabetes Brother    Diabetes Sister    Hypertension Sister    Diabetes Brother    Diabetes Brother     Social History   Tobacco Use   Smoking status: Never   Smokeless tobacco: Never  Vaping Use   Vaping Use: Never used  Substance Use Topics   Alcohol use: Never   Drug use: Never    Home Medications Prior to Admission medications   Medication Sig Start Date End Date Taking? Authorizing Provider  predniSONE (DELTASONE) 10 MG tablet Take 1 tablet (10 mg total) by mouth daily. 6,5,4,3,2,1  six day taper 09/10/21  Yes Duanne Guess, PA-C  amLODipine (NORVASC) 10 MG tablet TAKE 1 TABLET BY MOUTH EVERY DAY 04/22/21   Isaac Bliss, Rayford Halsted, MD  amLODipine (NORVASC) 10 MG tablet TAKE 1 TABLET BY MOUTH EVERY DAY 05/24/21   Isaac Bliss, Rayford Halsted, MD  amLODipine (NORVASC) 5 MG tablet TAKE 1 TABLET BY MOUTH EVERY DAY 10/29/20   Isaac Bliss, Rayford Halsted, MD  ASPIRIN 81 PO Take by mouth.    [provider]  hydrochlorothiazide (HYDRODIURIL) 25 MG tablet TAKE 1 TABLET BY MOUTH EVERY DAY 05/24/21   Isaac Bliss, Rayford Halsted, MD  LEVEMIR FLEXTOUCH 100 UNIT/ML FlexTouch Pen INJECT 60 UNITS INTO THE SKIN DAILY. 09/03/21   Isaac Bliss, Rayford Halsted, MD  lisinopril (ZESTRIL) 10 MG tablet TAKE 1 TABLET BY MOUTH EVERY DAY 10/30/20   Isaac Bliss, Rayford Halsted, MD  Omega-3 Fatty Acids (FISH OIL PO) Take by mouth.    [provider]  OZEMPIC, 0.25 OR 0.5 MG/DOSE, 2 MG/1.5ML SOPN INJECT 0.5 MG INTO THE SKIN ONCE A WEEK. 05/27/21   Isaac Bliss, Rayford Halsted, MD  TURMERIC PO Take by mouth.    [provider]    Allergies    Clindamycin, Clindamycin/lincomycin, Chocolate, Chocolate flavor, Food color red, Food color yellow, and Aspirin  Review of Systems   Review of Systems  Constitutional:  Negative for chills and fever.  Respiratory:  Negative for shortness of breath.   Cardiovascular:  Negative for chest pain and leg swelling.  Genitourinary:  Negative for dysuria.  Musculoskeletal:  Positive for arthralgias and myalgias. Negative for gait problem, joint swelling, neck pain and neck stiffness.  Skin:  Negative for rash and wound.  Neurological:  Negative for dizziness and headaches.   Physical Exam Updated Vital Signs BP (!) 150/68 (BP Location: Left Arm)   Pulse 90   Temp 98.9 F (37.2 C) (Oral)   Resp 18   Ht '5\' 3"'$  (1.6 m)   Wt 70.3 kg   SpO2 97%   BMI 27.46 kg/m   Physical Exam Constitutional:      Appearance: She is well-developed.  HENT:      Head: Normocephalic and atraumatic.  Eyes:     Conjunctiva/sclera: Conjunctivae normal.  Cardiovascular:     Rate and Rhythm: Normal rate.  Pulmonary:     Effort: Pulmonary effort is normal. No respiratory distress.  Abdominal:     General: Abdomen is flat. Bowel sounds are normal.     Palpations: Abdomen is soft.  Musculoskeletal:        General: Normal range of motion.     Cervical back: Normal range of motion.     Comments: Examination of the left hip shows full internal and external rotation with no discomfort.  She has no pain with hip range of motion.  She has point tenderness over the left hip trochanteric bursa but no weakness with abduction.  She has no swelling throughout the left lower extremity.  No edema.  Skin:    General: Skin is warm.     Findings: No rash.  Neurological:     Mental Status: She is alert and oriented to person, place, and time.  Psychiatric:        Behavior: Behavior normal.        Thought Content: Thought content normal.    ED Results / Procedures / Treatments   Labs (all labs ordered are listed, but only abnormal results are displayed) Labs Reviewed - No data to display  EKG None  Radiology DG Hip Unilat With Pelvis 2-3 Views Left  Result Date: 09/10/2021 CLINICAL DATA:  Left hip pain. EXAM: DG HIP (WITH OR WITHOUT PELVIS) 2-3V LEFT COMPARISON:  None. FINDINGS: There is no evidence of hip fracture or dislocation. Mild degenerative changes are seen in the form of joint space narrowing and acetabular sclerosis. Multiple phleboliths are seen within the lower pelvis. Mild to moderate severity vascular calcification is noted. IMPRESSION: Mild degenerative changes of the left hip. Electronically Signed   By: Virgina Norfolk M.D.   On: 09/10/2021 21:07    Procedures Procedures   Medications Ordered in ED Medications  acetaminophen (TYLENOL) tablet 1,000 mg (has no administration in time range)    ED Course  I have reviewed the triage vital  signs and the nursing notes.  Pertinent labs & imaging results that were available during my care of the patient were reviewed by me and considered in my medical decision making (see chart for details).    MDM Rules/Calculators/A&P                         75 year old female with left hip pain.  X-ray showed no evidence of acute bony abnormality.  X-rays show mild osteoarthritis.  History and  physical exam consistent with pain coming from her bursitis and not osteoarthritis.  Patient will take Tylenol for mild to moderate pain.  She is placed on a 6-day steroid taper.  She will was given exercises to work on stretching and will follow-up with orthopedics if no improvement 1 week.  She understands signs symptoms return to ER for. Final Clinical Impression(s) / ED Diagnoses Final diagnoses:  Trochanteric bursitis of left hip    Rx / DC Orders ED Discharge Orders          Ordered    predniSONE (DELTASONE) 10 MG tablet  Daily        09/10/21 2234             Renata Caprice 09/10/21 2238    Carrie Mew, MD 09/14/21 1730

## 2021-09-10 NOTE — ED Triage Notes (Signed)
Pt arrived via [facility] from home with c/o L hip pain, denies fall, denies any injury, c/o stabbing pain.  Pt states she took ASA today for pain with no relief.

## 2021-09-10 NOTE — Discharge Instructions (Signed)
Please take Tylenol as needed for mild to moderate pain.  Start prednisone taper tomorrow and take as prescribed.  Avoid lying on the left hip and use a heating pad as needed for comfort.  Call orthopedic office in 1 week and schedule follow-up appointment if no relief.  Return to the ER for any worsening symptoms or urgent changes in your health.

## 2021-09-12 ENCOUNTER — Other Ambulatory Visit: Payer: Self-pay | Admitting: Nephrology

## 2021-09-12 ENCOUNTER — Other Ambulatory Visit: Payer: Self-pay | Admitting: Internal Medicine

## 2021-09-12 DIAGNOSIS — R809 Proteinuria, unspecified: Secondary | ICD-10-CM

## 2021-09-12 DIAGNOSIS — D631 Anemia in chronic kidney disease: Secondary | ICD-10-CM

## 2021-09-12 DIAGNOSIS — N1832 Chronic kidney disease, stage 3b: Secondary | ICD-10-CM

## 2021-09-30 ENCOUNTER — Emergency Department
Admission: EM | Admit: 2021-09-30 | Discharge: 2021-09-30 | Disposition: A | Discharge status: Home / Self Care | Payer: HMO | Attending: Emergency Medicine | Admitting: Emergency Medicine

## 2021-09-30 ENCOUNTER — Emergency Department: Payer: HMO

## 2021-09-30 ENCOUNTER — Other Ambulatory Visit: Payer: Self-pay

## 2021-09-30 DIAGNOSIS — H538 Other visual disturbances: Secondary | ICD-10-CM | POA: Diagnosis present

## 2021-09-30 DIAGNOSIS — I10 Essential (primary) hypertension: Secondary | ICD-10-CM | POA: Insufficient documentation

## 2021-09-30 DIAGNOSIS — H269 Unspecified cataract: Secondary | ICD-10-CM | POA: Diagnosis not present

## 2021-09-30 DIAGNOSIS — H5462 Unqualified visual loss, left eye, normal vision right eye: Secondary | ICD-10-CM | POA: Insufficient documentation

## 2021-09-30 DIAGNOSIS — Z7982 Long term (current) use of aspirin: Secondary | ICD-10-CM | POA: Insufficient documentation

## 2021-09-30 DIAGNOSIS — E119 Type 2 diabetes mellitus without complications: Secondary | ICD-10-CM | POA: Diagnosis not present

## 2021-09-30 DIAGNOSIS — Z79899 Other long term (current) drug therapy: Secondary | ICD-10-CM | POA: Insufficient documentation

## 2021-09-30 DIAGNOSIS — H547 Unspecified visual loss: Secondary | ICD-10-CM

## 2021-09-30 DIAGNOSIS — Z794 Long term (current) use of insulin: Secondary | ICD-10-CM | POA: Diagnosis not present

## 2021-09-30 LAB — CBC WITH DIFFERENTIAL/PLATELET
Abs Immature Granulocytes: 0.02 10*3/uL (ref 0.00–0.07)
Basophils Absolute: 0 10*3/uL (ref 0.0–0.1)
Basophils Relative: 1 %
Eosinophils Absolute: 0.1 10*3/uL (ref 0.0–0.5)
Eosinophils Relative: 2 %
HCT: 24.9 % — ABNORMAL LOW (ref 36.0–46.0)
Hemoglobin: 8.2 g/dL — ABNORMAL LOW (ref 12.0–15.0)
Immature Granulocytes: 0 %
Lymphocytes Relative: 29 %
Lymphs Abs: 1.7 10*3/uL (ref 0.7–4.0)
MCH: 30 pg (ref 26.0–34.0)
MCHC: 32.9 g/dL (ref 30.0–36.0)
MCV: 91.2 fL (ref 80.0–100.0)
Monocytes Absolute: 0.5 10*3/uL (ref 0.1–1.0)
Monocytes Relative: 9 %
Neutro Abs: 3.4 10*3/uL (ref 1.7–7.7)
Neutrophils Relative %: 59 %
Platelets: 251 10*3/uL (ref 150–400)
RBC: 2.73 MIL/uL — ABNORMAL LOW (ref 3.87–5.11)
RDW: 13.4 % (ref 11.5–15.5)
WBC: 5.8 10*3/uL (ref 4.0–10.5)
nRBC: 0 % (ref 0.0–0.2)

## 2021-09-30 LAB — COMPREHENSIVE METABOLIC PANEL
ALT: 12 U/L (ref 0–44)
AST: 17 U/L (ref 15–41)
Albumin: 3 g/dL — ABNORMAL LOW (ref 3.5–5.0)
Alkaline Phosphatase: 66 U/L (ref 38–126)
Anion gap: 3 — ABNORMAL LOW (ref 5–15)
BUN: 31 mg/dL — ABNORMAL HIGH (ref 8–23)
CO2: 25 mmol/L (ref 22–32)
Calcium: 8.8 mg/dL — ABNORMAL LOW (ref 8.9–10.3)
Chloride: 109 mmol/L (ref 98–111)
Creatinine, Ser: 1.83 mg/dL — ABNORMAL HIGH (ref 0.44–1.00)
GFR, Estimated: 28 mL/min — ABNORMAL LOW (ref >60)
Glucose, Bld: 126 mg/dL — ABNORMAL HIGH (ref 70–99)
Potassium: 4.3 mmol/L (ref 3.5–5.1)
Sodium: 137 mmol/L (ref 135–145)
Total Bilirubin: 0.4 mg/dL (ref 0.3–1.2)
Total Protein: 6.7 g/dL (ref 6.5–8.1)

## 2021-09-30 LAB — CBG MONITORING, ED: Glucose-Capillary: 120 mg/dL — ABNORMAL HIGH (ref 70–99)

## 2021-09-30 NOTE — ED Provider Notes (Signed)
[facility] Emergency Department Provider Note  ____________________________________________   Event Date/Time   First MD Initiated Contact with Patient 09/30/21 0935     (approximate)  I have reviewed the triage vital signs and the nursing notes.   HISTORY  Chief Complaint Eye Pain    HPI Dana [name] is a 75 y.o. female with history of diabetes and hypertension presents emergency department complaining of visual changes to the left eye.  Symptoms started last night around approximately 8:52 PM.  States that she can only see shadows.  She has a cataract in the right eye which is preventing her from seeing.  Now the left eye has become affected with decreased vision.  She denies headache at the time the vision changed.  She denies any known injury.  No history of Graves' disease.  Past Medical History:  Diagnosis Date   DM (diabetes mellitus), type 1 ([place])    Hypertension     Patient Active Problem List   Diagnosis Date Noted   PVD (peripheral vascular disease) ([place]) 01/21/2021   Hypertension 04/12/2020   Multinodular goiter 04/12/2020   Normocytic anemia 04/12/2020   Vertebral artery stenosis 04/12/2020   Type 2 diabetes mellitus with diabetic nephropathy, with long-term current use of insulin ([place]) 01/25/2020   DM (diabetes mellitus), type 1 ([place])     Past Surgical History:  Procedure Laterality Date   HYSTERECTOMY ABDOMINAL WITH SALPINGO-OOPHORECTOMY      Prior to Admission medications   Medication Sig Start Date End Date Taking? Authorizing Provider  amLODipine (NORVASC) 10 MG tablet TAKE 1 TABLET BY MOUTH EVERY DAY 04/22/21   Isaac Bliss, Rayford Halsted, MD  amLODipine (NORVASC) 10 MG tablet TAKE 1 TABLET BY MOUTH EVERY DAY 05/24/21   Isaac Bliss, Rayford Halsted, MD  amLODipine (NORVASC) 5 MG tablet TAKE 1 TABLET BY MOUTH EVERY DAY 10/29/20   Isaac Bliss, Rayford Halsted, MD  ASPIRIN 81 PO Take by mouth.    [provider]   hydrochlorothiazide (HYDRODIURIL) 25 MG tablet TAKE 1 TABLET BY MOUTH EVERY DAY 05/24/21   Isaac Bliss, Rayford Halsted, MD  LEVEMIR FLEXTOUCH 100 UNIT/ML FlexTouch Pen INJECT 60 UNITS INTO THE SKIN DAILY. 09/03/21   Isaac Bliss, Rayford Halsted, MD  lisinopril (ZESTRIL) 10 MG tablet TAKE 1 TABLET BY MOUTH EVERY DAY 10/30/20   Isaac Bliss, Rayford Halsted, MD  Omega-3 Fatty Acids (FISH OIL PO) Take by mouth.    [provider]  OZEMPIC, 0.25 OR 0.5 MG/DOSE, 2 MG/1.5ML SOPN INJECT 0.5 MG INTO THE SKIN ONCE A WEEK. 05/27/21   Isaac Bliss, Rayford Halsted, MD  TURMERIC PO Take by mouth.    [provider]    Allergies Clindamycin, Clindamycin/lincomycin, Chocolate, Chocolate flavor, Food color red, Food color yellow, and Aspirin  Family History  Problem Relation Age of Onset   Diabetes Mother    Hypertension Father    Diabetes Sister    Diabetes Brother    Diabetes Sister    Hypertension Sister    Diabetes Brother    Diabetes Brother     Social History Social History   Tobacco Use   Smoking status: Never   Smokeless tobacco: Never  Vaping Use   Vaping Use: Never used  Substance Use Topics   Alcohol use: Never   Drug use: Never    Review of Systems  Constitutional: No fever/chills Eyes: Positive visual changes. ENT: No sore throat. Respiratory: Denies cough Cardiovascular: Denies chest pain Gastrointestinal: Denies abdominal pain Genitourinary:  Negative for dysuria. Musculoskeletal: Negative for back pain. Skin: Negative for rash. Psychiatric: no mood changes,     ____________________________________________   PHYSICAL EXAM:  VITAL SIGNS: ED Triage Vitals  Enc Vitals Group     BP 09/30/21 0844 (!) 165/78     Pulse Rate 09/30/21 0844 76     Resp 09/30/21 0844 18     Temp 09/30/21 0844 98.1 F (36.7 C)     Temp Source 09/30/21 0844 Oral     SpO2 09/30/21 0844 100 %     Weight 09/30/21 0856 154 lb 15.7 oz (70.3 kg)     Height 09/30/21 0856 '5\' 3"'$  (1.6  m)     Head Circumference --      Peak Flow --      Pain Score 09/30/21 0843 0     Pain Loc --      Pain Edu? --      Excl. in [place]? --     Constitutional: Alert and oriented. Well appearing and in no acute distress. Eyes: Conjunctivae are normal.,  Eyes have a natural protrusion bilaterally, pupils are pinpoint, cataract noted in the right eye, EOMI, no entrapment or pain noted with eye movement Head: Atraumatic. Nose: No congestion/rhinnorhea. Mouth/Throat: Mucous membranes are moist.   Neck:  supple no lymphadenopathy noted Cardiovascular: Normal rate, regular rhythm. Heart sounds are normal Respiratory: Normal respiratory effort.  No retractions, lungs c t a  GU: deferred Musculoskeletal: FROM all extremities, warm and well perfused Neurologic:  Normal speech and language.  Skin:  Skin is warm, dry and intact. No rash noted. Psychiatric: Mood and affect are normal. Speech and behavior are normal.  ____________________________________________   LABS (all labs ordered are listed, but only abnormal results are displayed)  Labs Reviewed  CBC WITH DIFFERENTIAL/PLATELET - Abnormal; Notable for the following components:      Result Value   RBC 2.73 (*)    Hemoglobin 8.2 (*)    HCT 24.9 (*)    All other components within normal limits  COMPREHENSIVE METABOLIC PANEL - Abnormal; Notable for the following components:   Glucose, Bld 126 (*)    BUN 31 (*)    Creatinine, Ser 1.83 (*)    Calcium 8.8 (*)    Albumin 3.0 (*)    GFR, Estimated 28 (*)    Anion gap 3 (*)    All other components within normal limits  CBG MONITORING, ED - Abnormal; Notable for the following components:   Glucose-Capillary 120 (*)    All other components within normal limits   ____________________________________________   ____________________________________________  RADIOLOGY  CT of the head MRI of the brain  ____________________________________________   PROCEDURES  Procedure(s) performed:  No  Procedures    ____________________________________________   INITIAL IMPRESSION / ASSESSMENT AND PLAN / ED COURSE  Pertinent labs & imaging results that were available during my care of the patient were reviewed by me and considered in my medical decision making (see chart for details).   The patient is a 75 year old female presents with visual changes to the left eye.  See HPI.  Physical exam shows patient to appear stable  DDx: Retinal detachment, occlusion of artery or vein of the eye, SAH, CVA  CBC has a decreased hemoglobin of 8.2, comprehensive metabolic panel has a BUN of 31, creatinine of 1.83, calcium of 8.8, GFR is below 28, CBG is 120, on review of her old labs these numbers are normal for the patient's trend  CT of  the head reviewed by me confirmed by radiology to be negative  Consult to ophthalmology, MRI ordered  Dr. Edison Pace does not feel that we need to do the MRI.  She has no other neurologic findings they does not feel this is a stroke.  He thinks this may be more vitreous hemorrhage and would like for her to come to the office as soon as possible.  Patient is in agreement with treatment plan.  She was discharged stable condition to go to [facility].     Dana [name] was evaluated in Emergency Department on 09/30/2021 for the symptoms described in the history of present illness. She was evaluated in the context of the global COVID-19 pandemic, which necessitated consideration that the patient might be at risk for infection with the SARS-CoV-2 virus that causes COVID-19. Institutional protocols and algorithms that pertain to the evaluation of patients at risk for COVID-19 are in a state of rapid change based on information released by regulatory bodies including the CDC and federal and state organizations. These policies and algorithms were followed during the patient's care in the ED.    As part of my medical decision making, I reviewed the following data  within the [facility] notes reviewed and incorporated, Labs reviewed , Old chart reviewed, Radiograph reviewed , A consult was requested and obtained from this/these consultant(s) ophthalmology, Notes from prior ED visits, and [place] Controlled Substance Database  ____________________________________________   FINAL CLINICAL IMPRESSION(S) / ED DIAGNOSES  Final diagnoses:  Visual loss      NEW MEDICATIONS STARTED DURING THIS VISIT:  Discharge Medication List as of 09/30/2021 12:31 PM       Note:  This document was prepared using Dragon voice recognition software and may include unintentional dictation errors.    Versie Starks, PA-C 09/30/21 1443    Lucrezia Starch, MD 09/30/21 [phone]

## 2021-09-30 NOTE — ED Notes (Signed)
See triage note   presents with some blurred vision   states she is not able to see out of right eye d/t cataract   states she is seeing floaters  denies any trauma  vision is still blurred with glasses

## 2021-09-30 NOTE — Discharge Instructions (Addendum)
Go to [facility] for evaluation

## 2021-09-30 NOTE — ED Triage Notes (Signed)
Pt comes with c/o left eye pain. Pt states it started yesterday. Pt states blurry vision and seeing shadows.  Pt denies any other symptoms.

## 2021-10-13 ENCOUNTER — Other Ambulatory Visit: Payer: Self-pay | Admitting: Internal Medicine

## 2021-11-21 ENCOUNTER — Other Ambulatory Visit: Payer: Self-pay | Admitting: Internal Medicine

## 2021-11-21 NOTE — Telephone Encounter (Signed)
Attempted to call patient to schedule an appointment.  

## 2021-11-25 ENCOUNTER — Other Ambulatory Visit: Payer: Self-pay | Admitting: Internal Medicine

## 2021-11-28 ENCOUNTER — Other Ambulatory Visit: Payer: Self-pay | Admitting: Internal Medicine

## 2021-11-28 HISTORY — PX: EYE SURGERY: SHX253

## 2022-02-05 ENCOUNTER — Other Ambulatory Visit: Payer: Self-pay | Admitting: Nephrology

## 2022-02-05 DIAGNOSIS — N1832 Anemia in chronic kidney disease: Secondary | ICD-10-CM

## 2022-02-05 DIAGNOSIS — I129 Hypertensive chronic kidney disease with stage 1 through stage 4 chronic kidney disease, or unspecified chronic kidney disease: Secondary | ICD-10-CM | POA: Diagnosis not present

## 2022-02-05 DIAGNOSIS — E1122 Type 2 diabetes mellitus with diabetic chronic kidney disease: Secondary | ICD-10-CM | POA: Diagnosis not present

## 2022-02-05 DIAGNOSIS — R809 Proteinuria, unspecified: Secondary | ICD-10-CM | POA: Diagnosis not present

## 2022-02-05 DIAGNOSIS — N2581 Secondary hyperparathyroidism of renal origin: Secondary | ICD-10-CM

## 2022-02-05 DIAGNOSIS — D631 Anemia in chronic kidney disease: Secondary | ICD-10-CM | POA: Diagnosis not present

## 2022-02-11 DIAGNOSIS — E7800 Pure hypercholesterolemia, unspecified: Secondary | ICD-10-CM | POA: Diagnosis not present

## 2022-02-11 DIAGNOSIS — Z794 Long term (current) use of insulin: Secondary | ICD-10-CM | POA: Diagnosis not present

## 2022-02-11 DIAGNOSIS — I10 Essential (primary) hypertension: Secondary | ICD-10-CM | POA: Diagnosis not present

## 2022-02-11 DIAGNOSIS — N184 Chronic kidney disease, stage 4 (severe): Secondary | ICD-10-CM | POA: Diagnosis not present

## 2022-02-11 DIAGNOSIS — E1121 Type 2 diabetes mellitus with diabetic nephropathy: Secondary | ICD-10-CM | POA: Diagnosis not present

## 2022-02-12 ENCOUNTER — Ambulatory Visit: Payer: HMO | Attending: Nephrology

## 2022-02-25 ENCOUNTER — Other Ambulatory Visit: Payer: Self-pay | Admitting: Nephrology

## 2022-02-25 DIAGNOSIS — R809 Proteinuria, unspecified: Secondary | ICD-10-CM

## 2022-02-28 ENCOUNTER — Telehealth: Payer: Self-pay

## 2022-02-28 NOTE — Progress Notes (Signed)
Spoke with patient 02/28/22 @ 12:20 pm. Need to arrive at 7:30 for 8:30 procedure, NPO after midnight night before procedure, continue BP meds as normal, need for driver post procedure reviewed with patient. Patient verbalized understanding. Patient no longer taking ASA or any other blood thinners. Patient will bring husband with her for post procedure care. ?

## 2022-03-04 ENCOUNTER — Emergency Department
Admission: EM | Admit: 2022-03-04 | Discharge: 2022-03-04 | Disposition: A | Discharge status: Home / Self Care | Payer: No Typology Code available for payment source | Attending: Emergency Medicine | Admitting: Emergency Medicine

## 2022-03-04 ENCOUNTER — Emergency Department: Payer: No Typology Code available for payment source

## 2022-03-04 ENCOUNTER — Other Ambulatory Visit: Payer: Self-pay | Admitting: Student

## 2022-03-04 ENCOUNTER — Other Ambulatory Visit: Payer: Self-pay | Admitting: Radiology

## 2022-03-04 DIAGNOSIS — E119 Type 2 diabetes mellitus without complications: Secondary | ICD-10-CM | POA: Diagnosis not present

## 2022-03-04 DIAGNOSIS — Z20822 Contact with and (suspected) exposure to covid-19: Secondary | ICD-10-CM | POA: Diagnosis not present

## 2022-03-04 DIAGNOSIS — R1084 Generalized abdominal pain: Secondary | ICD-10-CM | POA: Diagnosis not present

## 2022-03-04 DIAGNOSIS — K5730 Diverticulosis of large intestine without perforation or abscess without bleeding: Secondary | ICD-10-CM | POA: Diagnosis not present

## 2022-03-04 DIAGNOSIS — R112 Nausea with vomiting, unspecified: Secondary | ICD-10-CM | POA: Diagnosis not present

## 2022-03-04 DIAGNOSIS — K8020 Calculus of gallbladder without cholecystitis without obstruction: Secondary | ICD-10-CM | POA: Diagnosis not present

## 2022-03-04 DIAGNOSIS — I10 Essential (primary) hypertension: Secondary | ICD-10-CM | POA: Insufficient documentation

## 2022-03-04 DIAGNOSIS — N200 Calculus of kidney: Secondary | ICD-10-CM

## 2022-03-04 DIAGNOSIS — R110 Nausea: Secondary | ICD-10-CM | POA: Diagnosis not present

## 2022-03-04 DIAGNOSIS — R1031 Right lower quadrant pain: Secondary | ICD-10-CM | POA: Diagnosis not present

## 2022-03-04 DIAGNOSIS — N132 Hydronephrosis with renal and ureteral calculous obstruction: Secondary | ICD-10-CM | POA: Insufficient documentation

## 2022-03-04 LAB — CBC WITH DIFFERENTIAL/PLATELET
Abs Immature Granulocytes: 0.06 10*3/uL (ref 0.00–0.07)
Basophils Absolute: 0 10*3/uL (ref 0.0–0.1)
Basophils Relative: 0 %
Eosinophils Absolute: 0.1 10*3/uL (ref 0.0–0.5)
Eosinophils Relative: 2 %
HCT: 31.2 % — ABNORMAL LOW (ref 36.0–46.0)
Hemoglobin: 9.6 g/dL — ABNORMAL LOW (ref 12.0–15.0)
Immature Granulocytes: 1 %
Lymphocytes Relative: 17 %
Lymphs Abs: 1.4 10*3/uL (ref 0.7–4.0)
MCH: 28.2 pg (ref 26.0–34.0)
MCHC: 30.8 g/dL (ref 30.0–36.0)
MCV: 91.8 fL (ref 80.0–100.0)
Monocytes Absolute: 0.4 10*3/uL (ref 0.1–1.0)
Monocytes Relative: 5 %
Neutro Abs: 6.2 10*3/uL (ref 1.7–7.7)
Neutrophils Relative %: 75 %
Platelets: 273 10*3/uL (ref 150–400)
RBC: 3.4 MIL/uL — ABNORMAL LOW (ref 3.87–5.11)
RDW: 12.9 % (ref 11.5–15.5)
WBC: 8.2 10*3/uL (ref 4.0–10.5)
nRBC: 0 % (ref 0.0–0.2)

## 2022-03-04 LAB — COMPREHENSIVE METABOLIC PANEL
ALT: 20 U/L (ref 0–44)
AST: 34 U/L (ref 15–41)
Albumin: 3 g/dL — ABNORMAL LOW (ref 3.5–5.0)
Alkaline Phosphatase: 88 U/L (ref 38–126)
Anion gap: 6 (ref 5–15)
BUN: 35 mg/dL — ABNORMAL HIGH (ref 8–23)
CO2: 20 mmol/L — ABNORMAL LOW (ref 22–32)
Calcium: 8.6 mg/dL — ABNORMAL LOW (ref 8.9–10.3)
Chloride: 113 mmol/L — ABNORMAL HIGH (ref 98–111)
Creatinine, Ser: 2.5 mg/dL — ABNORMAL HIGH (ref 0.44–1.00)
GFR, Estimated: 19 mL/min — ABNORMAL LOW (ref >60)
Glucose, Bld: 171 mg/dL — ABNORMAL HIGH (ref 70–99)
Potassium: 4.2 mmol/L (ref 3.5–5.1)
Sodium: 139 mmol/L (ref 135–145)
Total Bilirubin: 0.6 mg/dL (ref 0.3–1.2)
Total Protein: 7.2 g/dL (ref 6.5–8.1)

## 2022-03-04 LAB — LACTIC ACID, PLASMA: Lactic Acid, Venous: 1 mmol/L (ref 0.5–1.9)

## 2022-03-04 LAB — LIPASE, BLOOD: Lipase: 23 U/L (ref 11–51)

## 2022-03-04 LAB — RESP PANEL BY RT-PCR (FLU A&B, COVID) ARPGX2
Influenza A by PCR: NEGATIVE
Influenza B by PCR: NEGATIVE
SARS Coronavirus 2 by RT PCR: NEGATIVE

## 2022-03-04 MED ORDER — DROPERIDOL 2.5 MG/ML IJ SOLN
2.5000 mg | Freq: Once | INTRAMUSCULAR | Status: DC
  Filled 2022-03-04: qty 2

## 2022-03-04 MED ORDER — ONDANSETRON HCL 4 MG/2ML IJ SOLN
4.0000 mg | Freq: Once | INTRAMUSCULAR | Status: AC
  Administered 2022-03-04: 4 mg via INTRAVENOUS
  Filled 2022-03-04: qty 2

## 2022-03-04 MED ORDER — HYDROCODONE-ACETAMINOPHEN 5-325 MG PO TABS: 1.0000 | ORAL_TABLET | ORAL | 0 refills | Status: DC | PRN

## 2022-03-04 MED ORDER — TAMSULOSIN HCL 0.4 MG PO CAPS
0.8000 mg | ORAL_CAPSULE | Freq: Once | ORAL | Status: AC
  Administered 2022-03-04: 0.8 mg via ORAL
  Filled 2022-03-04: qty 2

## 2022-03-04 MED ORDER — TAMSULOSIN HCL 0.4 MG PO CAPS: 0.4000 mg | ORAL_CAPSULE | Freq: Every day | ORAL | 0 refills | Status: DC

## 2022-03-04 MED ORDER — SODIUM CHLORIDE 0.9 % IV BOLUS
1000.0000 mL | Freq: Once | INTRAVENOUS | Status: AC
  Administered 2022-03-04: 1000 mL via INTRAVENOUS

## 2022-03-04 MED ORDER — ONDANSETRON 8 MG PO TBDP: 8.0000 mg | ORAL_TABLET | Freq: Three times a day (TID) | ORAL | 0 refills | Status: DC | PRN

## 2022-03-04 NOTE — ED Provider Notes (Signed)
? ?[facility] ?Provider Note ? ? Event Date/Time  ? First MD Initiated Contact with Patient 03/04/22 [phone]   ?  (approximate) ?History  ?Emesis (Pt has RLQ pain with N/V onset approximately 6:30 this AM. Reports vomiting green bile. Pt tender and guarding abdomen. Hypertensive on arrival but reports not taking her normal AM meds. ) ? ?HPI ?Dana [name] is a 76 y.o. female with a stated past medical history of hypertension, hypercholesterolemia, and type 2 diabetes who presents for right lower quadrant abdominal pain that began approximately 630 this morning with associated vomiting.  Patient describes the pain as 8/10, aching, nonradiating, and stable since onset.  Patient endorses 2 well-formed bowel movements already this morning that did not worsen or alleviate this pain.  Patient denies any exacerbating or relieving factors ?Physical Exam  ?Triage Vital Signs: ?ED Triage Vitals  ?Enc Vitals Group  ?   BP 03/04/22 0919 (!) 225/95  ?   Pulse Rate 03/04/22 0919 77  ?   Resp 03/04/22 0919 14  ?   Temp 03/04/22 0919 97.7 ?F (36.5 ?C)  ?   Temp Source 03/04/22 0919 Oral  ?   SpO2 03/04/22 0919 100 %  ?   Weight 03/04/22 0920 150 lb (68 kg)  ?   Height 03/04/22 0920 5\' 3"  (1.6 m)  ?   Head Circumference --   ?   Peak Flow --   ?   Pain Score 03/04/22 0920 8  ?   Pain Loc --   ?   Pain Edu? --   ?   Excl. in [place]? --   ? ?Most recent vital signs: ?Vitals:  ? 03/04/22 0919 03/04/22 1445  ?BP: (!) 225/95 (!) 178/97  ?Pulse: 77 99  ?Resp: 14 20  ?Temp: 97.7 ?F (36.5 ?C)   ?SpO2: 100% 97%  ? ?General: Awake, oriented x4. ?CV:  Good peripheral perfusion.  ?Resp:  Normal effort.  ?Abd:  No distention.  Right lower quadrant tenderness to palpation with guarding ?Other:  Elderly African-American female laying in bed in no distress ?ED Results / Procedures / Treatments  ?Labs ?(all labs ordered are listed, but only abnormal results are displayed) ?Labs Reviewed  ?COMPREHENSIVE METABOLIC PANEL - Abnormal;  Notable for the following components:  ?    Result Value  ? Chloride 113 (*)   ? CO2 20 (*)   ? Glucose, Bld 171 (*)   ? BUN 35 (*)   ? Creatinine, Ser 2.50 (*)   ? Calcium 8.6 (*)   ? Albumin 3.0 (*)   ? GFR, Estimated 19 (*)   ? All other components within normal limits  ?CBC WITH DIFFERENTIAL/PLATELET - Abnormal; Notable for the following components:  ? RBC 3.40 (*)   ? Hemoglobin 9.6 (*)   ? HCT 31.2 (*)   ? All other components within normal limits  ?RESP PANEL BY RT-PCR (FLU A&B, COVID) ARPGX2  ?LIPASE, BLOOD  ?LACTIC ACID, PLASMA  ? ?EKG ?ED ECG REPORT ?I, Naaman Plummer, the attending physician, personally viewed and interpreted this ECG. ?Date: 03/04/2022 ?EKG Time: 0922 ?Rate: 77 ?Rhythm: normal sinus rhythm ?QRS Axis: normal ?Intervals: normal ?ST/T Wave abnormalities: normal ?Narrative Interpretation: no evidence of acute ischemia ?RADIOLOGY ?ED MD interpretation: CT of the abdomen and pelvis without IV contrast interpreted by me and shows right-sided ureterolithiasis with associated hydroureteronephrosis consistent with an acute kidney stone ?-Agree with radiology assessment ?Official radiology report(s): ?No results found. ?PROCEDURES: ?Critical Care performed: No ?.  1-3 Lead EKG Interpretation ?Performed by: Naaman Plummer, MD ?Authorized by: Naaman Plummer, MD  ? ?  Interpretation: normal   ?  ECG rate:  97 ?  ECG rate assessment: normal   ?  Rhythm: sinus rhythm   ?  Ectopy: none   ?  Conduction: normal   ?MEDICATIONS ORDERED IN ED: ?Medications  ?sodium chloride 0.9 % bolus 1,000 mL (0 mLs Intravenous Stopped 03/04/22 1400)  ?ondansetron (ZOFRAN) injection 4 mg (4 mg Intravenous Given 03/04/22 1011)  ?ondansetron (ZOFRAN) injection 4 mg (4 mg Intravenous Given 03/04/22 1059)  ?tamsulosin (FLOMAX) capsule 0.8 mg (0.8 mg Oral Given 03/04/22 1424)  ? ?IMPRESSION / MDM / ASSESSMENT AND PLAN / ED COURSE  ?I reviewed the triage vital signs and the nursing notes. ?             ?               ?The patient is  on the cardiac monitor to evaluate for evidence of arrhythmia and/or significant heart rate changes. ?Patient presents for severe right lower quadrant abdominal pain. ?Presentation most consistent with Renal Colic from a Non-infected Kidney Stone. ?Given History and Exam I have lower suspicion for atypical appendicitis, genital torsion, acute cholecystitis, AAA, Aortic Dissection, Serious Bacterial Illness or other emergent intraabdominal pathology. ? ?Workup: CBC, BMP, CT Abd/Pelvis noncontrast, UA, reassess ?Findings: ?Right-sided ureterolithiasis with associated hydroureteronephrosis ?Reassesment: Patient tolerating PO and pain controlled ?Rx: Norco, Flomax, Zofran ?Disposition:  Discharge. Strict return precautions for infected stone or PO intolerance discussed. ? ?  ?FINAL CLINICAL IMPRESSION(S) / ED DIAGNOSES  ? ?Final diagnoses:  ?Kidney stone on right side  ?Nausea and vomiting, unspecified vomiting type  ? ?Rx / DC Orders  ? ?ED Discharge Orders   ? ?      Ordered  ?  tamsulosin (FLOMAX) 0.4 MG CAPS capsule  Daily       ? 03/04/22 1436  ?  HYDROcodone-acetaminophen (NORCO) 5-325 MG tablet  Every 4 hours PRN       ? 03/04/22 1436  ?  ondansetron (ZOFRAN-ODT) 8 MG disintegrating tablet  Every 8 hours PRN       ? 03/04/22 1436  ? ?  ?  ? ?  ? ?Note:  This document was prepared using Dragon voice recognition software and may include unintentional dictation errors. ?  ?Naaman Plummer, MD ?03/05/22 1347 ? ?

## 2022-03-05 ENCOUNTER — Ambulatory Visit
Admission: RE | Admit: 2022-03-05 | Discharge: 2022-03-05 | Disposition: A | Discharge status: Home / Self Care | Payer: HMO | Source: Ambulatory Visit | Attending: Nephrology | Admitting: Nephrology

## 2022-03-08 ENCOUNTER — Emergency Department: Payer: No Typology Code available for payment source

## 2022-03-08 ENCOUNTER — Inpatient Hospital Stay: Payer: No Typology Code available for payment source | Admitting: Anesthesiology

## 2022-03-08 ENCOUNTER — Other Ambulatory Visit: Payer: Self-pay

## 2022-03-08 ENCOUNTER — Encounter
Admission: EM | Disposition: A | Discharge status: Home Health | Payer: Self-pay | Source: Home / Self Care | Attending: Internal Medicine

## 2022-03-08 ENCOUNTER — Inpatient Hospital Stay: Payer: No Typology Code available for payment source

## 2022-03-08 ENCOUNTER — Inpatient Hospital Stay
Admission: EM | Admit: 2022-03-08 | Discharge: 2022-03-15 | DRG: 660 | Disposition: A | Discharge status: Home Health | Payer: No Typology Code available for payment source | Attending: Internal Medicine | Admitting: Internal Medicine

## 2022-03-08 DIAGNOSIS — N390 Urinary tract infection, site not specified: Secondary | ICD-10-CM | POA: Diagnosis not present

## 2022-03-08 DIAGNOSIS — N201 Calculus of ureter: Secondary | ICD-10-CM | POA: Diagnosis present

## 2022-03-08 DIAGNOSIS — Z833 Family history of diabetes mellitus: Secondary | ICD-10-CM

## 2022-03-08 DIAGNOSIS — M48061 Spinal stenosis, lumbar region without neurogenic claudication: Secondary | ICD-10-CM | POA: Diagnosis present

## 2022-03-08 DIAGNOSIS — E1022 Type 1 diabetes mellitus with diabetic chronic kidney disease: Secondary | ICD-10-CM | POA: Diagnosis present

## 2022-03-08 DIAGNOSIS — E8721 Acute metabolic acidosis: Secondary | ICD-10-CM | POA: Diagnosis present

## 2022-03-08 DIAGNOSIS — N179 Acute kidney failure, unspecified: Principal | ICD-10-CM | POA: Diagnosis present

## 2022-03-08 DIAGNOSIS — N134 Hydroureter: Secondary | ICD-10-CM

## 2022-03-08 DIAGNOSIS — E871 Hypo-osmolality and hyponatremia: Secondary | ICD-10-CM | POA: Diagnosis present

## 2022-03-08 DIAGNOSIS — I3139 Other pericardial effusion (noninflammatory): Secondary | ICD-10-CM | POA: Diagnosis not present

## 2022-03-08 DIAGNOSIS — M25572 Pain in left ankle and joints of left foot: Secondary | ICD-10-CM | POA: Diagnosis not present

## 2022-03-08 DIAGNOSIS — D631 Anemia in chronic kidney disease: Secondary | ICD-10-CM | POA: Diagnosis not present

## 2022-03-08 DIAGNOSIS — K5730 Diverticulosis of large intestine without perforation or abscess without bleeding: Secondary | ICD-10-CM | POA: Diagnosis not present

## 2022-03-08 DIAGNOSIS — D649 Anemia, unspecified: Secondary | ICD-10-CM | POA: Diagnosis not present

## 2022-03-08 DIAGNOSIS — N1330 Unspecified hydronephrosis: Secondary | ICD-10-CM

## 2022-03-08 DIAGNOSIS — M25552 Pain in left hip: Secondary | ICD-10-CM | POA: Diagnosis not present

## 2022-03-08 DIAGNOSIS — N136 Pyonephrosis: Secondary | ICD-10-CM | POA: Diagnosis present

## 2022-03-08 DIAGNOSIS — E876 Hypokalemia: Secondary | ICD-10-CM | POA: Diagnosis not present

## 2022-03-08 DIAGNOSIS — I2510 Atherosclerotic heart disease of native coronary artery without angina pectoris: Secondary | ICD-10-CM | POA: Diagnosis present

## 2022-03-08 DIAGNOSIS — N139 Obstructive and reflux uropathy, unspecified: Secondary | ICD-10-CM | POA: Diagnosis present

## 2022-03-08 DIAGNOSIS — R188 Other ascites: Secondary | ICD-10-CM | POA: Diagnosis present

## 2022-03-08 DIAGNOSIS — E785 Hyperlipidemia, unspecified: Secondary | ICD-10-CM | POA: Diagnosis not present

## 2022-03-08 DIAGNOSIS — I129 Hypertensive chronic kidney disease with stage 1 through stage 4 chronic kidney disease, or unspecified chronic kidney disease: Secondary | ICD-10-CM | POA: Diagnosis present

## 2022-03-08 DIAGNOSIS — M79605 Pain in left leg: Secondary | ICD-10-CM | POA: Diagnosis not present

## 2022-03-08 DIAGNOSIS — M79662 Pain in left lower leg: Secondary | ICD-10-CM | POA: Diagnosis not present

## 2022-03-08 DIAGNOSIS — Z8249 Family history of ischemic heart disease and other diseases of the circulatory system: Secondary | ICD-10-CM

## 2022-03-08 DIAGNOSIS — E10649 Type 1 diabetes mellitus with hypoglycemia without coma: Secondary | ICD-10-CM | POA: Diagnosis not present

## 2022-03-08 DIAGNOSIS — E109 Type 1 diabetes mellitus without complications: Secondary | ICD-10-CM | POA: Diagnosis present

## 2022-03-08 DIAGNOSIS — E119 Type 2 diabetes mellitus without complications: Secondary | ICD-10-CM | POA: Diagnosis not present

## 2022-03-08 DIAGNOSIS — M4726 Other spondylosis with radiculopathy, lumbar region: Secondary | ICD-10-CM | POA: Diagnosis not present

## 2022-03-08 DIAGNOSIS — I10 Essential (primary) hypertension: Secondary | ICD-10-CM | POA: Diagnosis present

## 2022-03-08 DIAGNOSIS — I517 Cardiomegaly: Secondary | ICD-10-CM | POA: Diagnosis not present

## 2022-03-08 DIAGNOSIS — N200 Calculus of kidney: Secondary | ICD-10-CM | POA: Diagnosis not present

## 2022-03-08 DIAGNOSIS — R531 Weakness: Secondary | ICD-10-CM

## 2022-03-08 DIAGNOSIS — M5117 Intervertebral disc disorders with radiculopathy, lumbosacral region: Secondary | ICD-10-CM | POA: Diagnosis not present

## 2022-03-08 DIAGNOSIS — M4807 Spinal stenosis, lumbosacral region: Secondary | ICD-10-CM | POA: Diagnosis present

## 2022-03-08 DIAGNOSIS — E1051 Type 1 diabetes mellitus with diabetic peripheral angiopathy without gangrene: Secondary | ICD-10-CM | POA: Diagnosis present

## 2022-03-08 DIAGNOSIS — N184 Chronic kidney disease, stage 4 (severe): Secondary | ICD-10-CM | POA: Diagnosis not present

## 2022-03-08 DIAGNOSIS — A419 Sepsis, unspecified organism: Secondary | ICD-10-CM | POA: Diagnosis not present

## 2022-03-08 DIAGNOSIS — R600 Localized edema: Secondary | ICD-10-CM | POA: Diagnosis not present

## 2022-03-08 DIAGNOSIS — E1021 Type 1 diabetes mellitus with diabetic nephropathy: Secondary | ICD-10-CM

## 2022-03-08 DIAGNOSIS — K8020 Calculus of gallbladder without cholecystitis without obstruction: Secondary | ICD-10-CM | POA: Diagnosis not present

## 2022-03-08 DIAGNOSIS — N1832 Chronic kidney disease, stage 3b: Secondary | ICD-10-CM | POA: Diagnosis not present

## 2022-03-08 DIAGNOSIS — E872 Acidosis, unspecified: Secondary | ICD-10-CM | POA: Diagnosis present

## 2022-03-08 DIAGNOSIS — Z794 Long term (current) use of insulin: Secondary | ICD-10-CM | POA: Diagnosis not present

## 2022-03-08 DIAGNOSIS — E162 Hypoglycemia, unspecified: Secondary | ICD-10-CM | POA: Diagnosis not present

## 2022-03-08 HISTORY — PX: CYSTOSCOPY WITH STENT PLACEMENT: SHX5790

## 2022-03-08 HISTORY — PX: URETEROSCOPY: SHX842

## 2022-03-08 LAB — URINALYSIS, COMPLETE (UACMP) WITH MICROSCOPIC
Bilirubin Urine: NEGATIVE
Glucose, UA: 50 mg/dL — AB
Ketones, ur: 5 mg/dL — AB
Leukocytes,Ua: NEGATIVE
Nitrite: NEGATIVE
Protein, ur: 100 mg/dL — AB
Specific Gravity, Urine: 1.01 (ref 1.005–1.030)
pH: 5 (ref 5.0–8.0)

## 2022-03-08 LAB — CBC
HCT: 28.5 % — ABNORMAL LOW (ref 36.0–46.0)
Hemoglobin: 9 g/dL — ABNORMAL LOW (ref 12.0–15.0)
MCH: 28.3 pg (ref 26.0–34.0)
MCHC: 31.6 g/dL (ref 30.0–36.0)
MCV: 89.6 fL (ref 80.0–100.0)
Platelets: 305 10*3/uL (ref 150–400)
RBC: 3.18 MIL/uL — ABNORMAL LOW (ref 3.87–5.11)
RDW: 12.9 % (ref 11.5–15.5)
WBC: 15 10*3/uL — ABNORMAL HIGH (ref 4.0–10.5)
nRBC: 0 % (ref 0.0–0.2)

## 2022-03-08 LAB — COMPREHENSIVE METABOLIC PANEL
ALT: 14 U/L (ref 0–44)
AST: 20 U/L (ref 15–41)
Albumin: 2.7 g/dL — ABNORMAL LOW (ref 3.5–5.0)
Alkaline Phosphatase: 85 U/L (ref 38–126)
Anion gap: 13 (ref 5–15)
BUN: 74 mg/dL — ABNORMAL HIGH (ref 8–23)
CO2: 16 mmol/L — ABNORMAL LOW (ref 22–32)
Calcium: 8.2 mg/dL — ABNORMAL LOW (ref 8.9–10.3)
Chloride: 100 mmol/L (ref 98–111)
Creatinine, Ser: 5.81 mg/dL — ABNORMAL HIGH (ref 0.44–1.00)
GFR, Estimated: 7 mL/min — ABNORMAL LOW (ref >60)
Glucose, Bld: 95 mg/dL (ref 70–99)
Potassium: 3.9 mmol/L (ref 3.5–5.1)
Sodium: 129 mmol/L — ABNORMAL LOW (ref 135–145)
Total Bilirubin: 0.7 mg/dL (ref 0.3–1.2)
Total Protein: 7 g/dL (ref 6.5–8.1)

## 2022-03-08 LAB — LIPASE, BLOOD: Lipase: 22 U/L (ref 11–51)

## 2022-03-08 LAB — GLUCOSE, CAPILLARY: Glucose-Capillary: 77 mg/dL (ref 70–99)

## 2022-03-08 LAB — SURGICAL PCR SCREEN
MRSA, PCR: NEGATIVE
Staphylococcus aureus: NEGATIVE

## 2022-03-08 LAB — ACETAMINOPHEN LEVEL: Acetaminophen (Tylenol), Serum: 14 ug/mL (ref 10–30)

## 2022-03-08 SURGERY — CYSTOSCOPY, WITH STENT INSERTION
Anesthesia: General | Laterality: Right

## 2022-03-08 MED ORDER — PROPOFOL 10 MG/ML IV BOLUS
INTRAVENOUS | Status: AC
  Filled 2022-03-08: qty 20

## 2022-03-08 MED ORDER — INSULIN DETEMIR 100 UNIT/ML ~~LOC~~ SOLN
60.0000 [IU] | Freq: Every day | SUBCUTANEOUS | Status: DC
  Administered 2022-03-09: 60 [IU] via SUBCUTANEOUS
  Filled 2022-03-08: qty 0.6

## 2022-03-08 MED ORDER — OXYCODONE HCL 5 MG PO TABS
5.0000 mg | ORAL_TABLET | Freq: Once | ORAL | Status: AC | PRN
  Administered 2022-03-08: 5 mg via ORAL

## 2022-03-08 MED ORDER — ACETAMINOPHEN 650 MG RE SUPP: 650.0000 mg | Freq: Four times a day (QID) | RECTAL | Status: DC | PRN

## 2022-03-08 MED ORDER — SODIUM CHLORIDE 0.9 % IV SOLN: Freq: Once | INTRAVENOUS | Status: DC

## 2022-03-08 MED ORDER — ONDANSETRON HCL 4 MG/2ML IJ SOLN
INTRAMUSCULAR | Status: AC
  Filled 2022-03-08: qty 2

## 2022-03-08 MED ORDER — PROPOFOL 10 MG/ML IV BOLUS
INTRAVENOUS | Status: AC
Start: 2022-03-08 — End: ?
  Filled 2022-03-08: qty 20

## 2022-03-08 MED ORDER — FENTANYL CITRATE (PF) 100 MCG/2ML IJ SOLN: 25.0000 ug | INTRAMUSCULAR | Status: DC | PRN

## 2022-03-08 MED ORDER — HYDROCODONE-ACETAMINOPHEN 5-325 MG PO TABS
1.0000 | ORAL_TABLET | ORAL | Status: DC | PRN
Start: 2022-03-08 — End: 2022-03-15
  Administered 2022-03-08 – 2022-03-14 (×8): 1 via ORAL
  Filled 2022-03-08 (×8): qty 1

## 2022-03-08 MED ORDER — LIDOCAINE HCL (CARDIAC) PF 100 MG/5ML IV SOSY
PREFILLED_SYRINGE | INTRAVENOUS | Status: DC | PRN
  Administered 2022-03-08: 60 mg via INTRAVENOUS

## 2022-03-08 MED ORDER — ONDANSETRON HCL 4 MG/2ML IJ SOLN
4.0000 mg | Freq: Once | INTRAMUSCULAR | Status: AC | PRN
  Administered 2022-03-08: 4 mg via INTRAVENOUS

## 2022-03-08 MED ORDER — PROPOFOL 10 MG/ML IV BOLUS
INTRAVENOUS | Status: DC | PRN
  Administered 2022-03-08: 150 ug/kg/min via INTRAVENOUS
  Administered 2022-03-08: 50 mg via INTRAVENOUS

## 2022-03-08 MED ORDER — SODIUM CHLORIDE 0.9 % IV SOLN: INTRAVENOUS | Status: DC

## 2022-03-08 MED ORDER — FENTANYL CITRATE (PF) 100 MCG/2ML IJ SOLN
INTRAMUSCULAR | Status: DC | PRN
  Administered 2022-03-08: 50 ug via INTRAVENOUS

## 2022-03-08 MED ORDER — TAMSULOSIN HCL 0.4 MG PO CAPS
0.4000 mg | ORAL_CAPSULE | Freq: Every day | ORAL | Status: DC
  Administered 2022-03-09 – 2022-03-15 (×7): 0.4 mg via ORAL
  Filled 2022-03-08 (×8): qty 1

## 2022-03-08 MED ORDER — ASPIRIN 81 MG PO CHEW
81.0000 mg | CHEWABLE_TABLET | Freq: Every day | ORAL | Status: DC
Start: 2022-03-08 — End: 2022-03-08

## 2022-03-08 MED ORDER — IOPAMIDOL (ISOVUE-200) INJECTION 41%
INTRAVENOUS | Status: DC | PRN
Start: 2022-03-08 — End: 2022-03-08
  Administered 2022-03-08: 20 mL via INTRAVENOUS

## 2022-03-08 MED ORDER — LIDOCAINE HCL (PF) 2 % IJ SOLN
INTRAMUSCULAR | Status: AC
  Filled 2022-03-08: qty 5

## 2022-03-08 MED ORDER — OXYCODONE HCL 5 MG PO TABS
ORAL_TABLET | ORAL | Status: AC
  Filled 2022-03-08: qty 1

## 2022-03-08 MED ORDER — ONDANSETRON HCL 4 MG PO TABS: 4.0000 mg | ORAL_TABLET | Freq: Four times a day (QID) | ORAL | Status: DC | PRN

## 2022-03-08 MED ORDER — LACTATED RINGERS IV SOLN: INTRAVENOUS | Status: DC | PRN

## 2022-03-08 MED ORDER — MAGNESIUM HYDROXIDE 400 MG/5ML PO SUSP: 30.0000 mL | Freq: Every day | ORAL | Status: DC | PRN

## 2022-03-08 MED ORDER — OMEGA-3-ACID ETHYL ESTERS 1 G PO CAPS
1.0000 g | ORAL_CAPSULE | Freq: Every day | ORAL | Status: DC
  Administered 2022-03-09 – 2022-03-15 (×7): 1 g via ORAL
  Filled 2022-03-08 (×8): qty 1

## 2022-03-08 MED ORDER — ENOXAPARIN SODIUM 40 MG/0.4ML IJ SOSY: 40.0000 mg | PREFILLED_SYRINGE | INTRAMUSCULAR | Status: DC

## 2022-03-08 MED ORDER — FENTANYL CITRATE (PF) 100 MCG/2ML IJ SOLN
INTRAMUSCULAR | Status: AC
  Filled 2022-03-08: qty 2

## 2022-03-08 MED ORDER — ONDANSETRON HCL 4 MG/2ML IJ SOLN
4.0000 mg | Freq: Four times a day (QID) | INTRAMUSCULAR | Status: DC | PRN
Start: 2022-03-08 — End: 2022-03-15
  Administered 2022-03-15: 4 mg via INTRAVENOUS
  Filled 2022-03-08: qty 2

## 2022-03-08 MED ORDER — AMLODIPINE BESYLATE 10 MG PO TABS
10.0000 mg | ORAL_TABLET | Freq: Every day | ORAL | Status: DC
  Administered 2022-03-09 – 2022-03-15 (×7): 10 mg via ORAL
  Filled 2022-03-08 (×8): qty 1

## 2022-03-08 MED ORDER — OXYCODONE HCL 5 MG/5ML PO SOLN: 5.0000 mg | Freq: Once | ORAL | Status: AC | PRN

## 2022-03-08 MED ORDER — TRAZODONE HCL 50 MG PO TABS: 25.0000 mg | ORAL_TABLET | Freq: Every evening | ORAL | Status: DC | PRN

## 2022-03-08 MED ORDER — SODIUM CHLORIDE 0.9 % IV BOLUS
500.0000 mL | Freq: Once | INTRAVENOUS | Status: AC
  Administered 2022-03-08: 500 mL via INTRAVENOUS

## 2022-03-08 MED ORDER — HEPARIN SODIUM (PORCINE) 5000 UNIT/ML IJ SOLN
5000.0000 [IU] | Freq: Three times a day (TID) | INTRAMUSCULAR | Status: DC
  Administered 2022-03-08 – 2022-03-15 (×20): 5000 [IU] via SUBCUTANEOUS
  Filled 2022-03-08 (×20): qty 1

## 2022-03-08 MED ORDER — ACETAMINOPHEN 325 MG PO TABS
650.0000 mg | ORAL_TABLET | Freq: Four times a day (QID) | ORAL | Status: DC | PRN
  Administered 2022-03-11 (×2): 650 mg via ORAL
  Filled 2022-03-08 (×2): qty 2

## 2022-03-08 MED ORDER — ONDANSETRON 4 MG PO TBDP: 8.0000 mg | ORAL_TABLET | Freq: Three times a day (TID) | ORAL | Status: DC | PRN

## 2022-03-08 MED ORDER — ONDANSETRON HCL 4 MG/2ML IJ SOLN
INTRAMUSCULAR | Status: DC | PRN
  Administered 2022-03-08: 4 mg via INTRAVENOUS

## 2022-03-08 MED ORDER — SODIUM CHLORIDE 0.9 % IV SOLN
1.0000 g | Freq: Once | INTRAVENOUS | Status: AC
  Administered 2022-03-08: 1 g via INTRAVENOUS
  Filled 2022-03-08: qty 10

## 2022-03-08 SURGICAL SUPPLY — 21 items
BAG DRAIN CYSTO-URO LG1000N (MISCELLANEOUS) ×2 IMPLANT
BAG URINE DRAIN 2000ML AR STRL (UROLOGICAL SUPPLIES) ×1 IMPLANT
BRUSH SCRUB EZ 1% IODOPHOR (MISCELLANEOUS) ×2 IMPLANT
CATH FOLEY 2WAY SIL 16X30 (CATHETERS) ×1 IMPLANT
CATH URETL OPEN 5X70 (CATHETERS) IMPLANT
GAUZE 4X4 16PLY ~~LOC~~+RFID DBL (SPONGE) ×4 IMPLANT
GLOVE SURG UNDER POLY LF SZ7.5 (GLOVE) ×2 IMPLANT
GOWN STRL REUS W/ TWL XL LVL3 (GOWN DISPOSABLE) ×1 IMPLANT
GOWN STRL REUS W/TWL XL LVL3 (GOWN DISPOSABLE) ×1
GUIDEWIRE ANG ZIPWIRE 035X150 (WIRE) ×1 IMPLANT
GUIDEWIRE STR DUAL SENSOR (WIRE) ×2 IMPLANT
IV NS IRRIG 3000ML ARTHROMATIC (IV SOLUTION) ×2 IMPLANT
KIT TURNOVER CYSTO (KITS) ×2 IMPLANT
MANIFOLD NEPTUNE II (INSTRUMENTS) ×2 IMPLANT
PACK CYSTO AR (MISCELLANEOUS) ×2 IMPLANT
SET CYSTO W/LG BORE CLAMP LF (SET/KITS/TRAYS/PACK) ×2 IMPLANT
STENT URET 6FRX24 CONTOUR (STENTS) IMPLANT
STENT URET 6FRX26 CONTOUR (STENTS) ×1 IMPLANT
SURGILUBE 2OZ TUBE FLIPTOP (MISCELLANEOUS) ×2 IMPLANT
WATER STERILE IRR 1000ML POUR (IV SOLUTION) ×2 IMPLANT
WATER STERILE IRR 500ML POUR (IV SOLUTION) ×2 IMPLANT

## 2022-03-08 NOTE — Assessment & Plan Note (Signed)
Management as above °

## 2022-03-08 NOTE — Consult Note (Signed)
? ?Urology Consult ? ?Requesting physician: Eugenie Norrie, MD ? ?Reason for consultation: Obstructing right ureteral calculus with AKI ? ?Chief Complaint: Flank pain ? ?History of Present Illness: Dana [name] is a 76 y.o. female who initially presented to the ED 03/04/2022 with right lower quadrant pain, nausea and vomiting.  Pain rated severe 8/10 and nonradiating.  She was afebrile and had a normal lactate and WBC.  A urinalysis was not performed.  CT abdomen/pelvis without contrast was performed which showed a 5 mm right proximal ureteral calculus with mild hydronephrosis/hydroureter.  She was treated with analgesics and discharged on hydrocodone, tamsulosin and Zofran. ? ?After discharge from the ED and she has continued to have right lower quadrant abdominal pain which now radiates to the right flank region.  She has had persistent nausea and vomiting and also diarrhea.  She has had anorexia and poor p.o. intake.  Denies fever or chills.  She returned to the ED late this morning with complaints of worsening symptoms. ? ?She was unable to provide a urine today.  WBC 15,000 (8.23/14).  Creatinine 5.81 (2.5- 03/04/2022; 1.83-09/2021) ? ?Repeat CT today with moderate hydronephrosis/hydroureter with stone in the same position as 3/14. ? ?Past Medical History:  ?Diagnosis Date  ? DM (diabetes mellitus), type 1 ([place])   ? Hypertension   ? ? ?Past Surgical History:  ?Procedure Laterality Date  ? HYSTERECTOMY ABDOMINAL WITH SALPINGO-OOPHORECTOMY    ? ? ?Home Medications:  ?Current Meds  ?Medication Sig  ? acetaminophen (TYLENOL) 500 MG tablet Take 500 mg by mouth every 6 (six) hours as needed for mild pain.  ? ASPIRIN 81 PO Take 81 mg by mouth daily.  ? hydrochlorothiazide (HYDRODIURIL) 25 MG tablet TAKE 1 TABLET BY MOUTH EVERY DAY  ? HYDROcodone-acetaminophen (NORCO) 5-325 MG tablet Take 1 tablet by mouth every 4 (four) hours as needed for up to 5 days for moderate pain or severe pain.  ? LEVEMIR FLEXTOUCH 100 UNIT/ML  FlexTouch Pen INJECT 60 UNITS INTO THE SKIN DAILY.  ? Omega-3 Fatty Acids (FISH OIL PO) Take by mouth.  ? ondansetron (ZOFRAN-ODT) 8 MG disintegrating tablet Take 1 tablet (8 mg total) by mouth every 8 (eight) hours as needed for nausea or vomiting.  ? rosuvastatin (CRESTOR) 10 MG tablet Take 10 mg by mouth daily.  ? tamsulosin (FLOMAX) 0.4 MG CAPS capsule Take 1 capsule (0.4 mg total) by mouth daily for 7 days.  ? telmisartan-hydrochlorothiazide (MICARDIS HCT) 40-12.5 MG tablet Take 1 tablet by mouth daily.  ? Turmeric 500 MG TABS Take 500 mg by mouth daily at 6 (six) AM.  ? [DISCONTINUED] amLODipine (NORVASC) 10 MG tablet TAKE 1 TABLET BY MOUTH EVERY DAY  ? ? ?Allergies:  ?Allergies  ?Allergen Reactions  ? Clindamycin Dermatitis and Rash  ? Clindamycin/Lincomycin Rash  ? Chocolate   ?  Nosebleed  ? Chocolate Flavor   ?  Other reaction(s): Unknown ?Nosebleed  ? Food Color Red   ?  Other reaction(s): Unknown ?Other reaction(s): Unknown  ? Food Color Yellow   ?  Other reaction(s): Unknown ?Other reaction(s): Unknown  ? Aspirin Rash  ?  Other reaction(s): Unknown: Document details in comments ?Pt states her PCP told her she was allergic to ASA.  Pt does not remember having a reaction.  ?Pt states her PCP told her she was allergic to ASA.  Pt does not remember having a reaction.  ?  ? ? ?Family History  ?Problem Relation Age of Onset  ? Diabetes Mother   ?  Hypertension Father   ? Diabetes Sister   ? Diabetes Brother   ? Diabetes Sister   ? Hypertension Sister   ? Diabetes Brother   ? Diabetes Brother   ? ? ?Social History:  reports that she has never smoked. She has never used smokeless tobacco. She reports that she does not drink alcohol and does not use drugs. ? ?ROS: ?A complete review of systems was performed.  All systems are negative except for pertinent findings as noted. ? ?Physical Exam:  ?Vital signs in last 24 hours: ?Temp:  [98.2 ?F (36.8 ?C)] 98.2 ?F (36.8 ?C) (03/18 1139) ?Pulse Rate:  [88-91] 88 (03/18  1430) ?Resp:  [18-19] 18 (03/18 1430) ?BP: (162-169)/(64-70) 169/69 (03/18 1430) ?SpO2:  [95 %-96 %] 96 % (03/18 1430) ?Weight:  [68 kg] 68 kg (03/18 1137) ?Constitutional:  Alert and oriented, No acute distress ?HEENT: [place] AT, moist mucus membranes.  Trachea midline, no masses ?Cardiovascular: Regular rate and rhythm, no clubbing, cyanosis, or edema. ?Respiratory: Normal respiratory effort, lungs clear bilaterally ?GI: Abdomen is soft, mild right RLQ tenderness ?GU: No CVA tenderness ?Skin: No rashes, bruises or suspicious lesions ?Lymph: No cervical or inguinal adenopathy ?Neurologic: Grossly intact, no focal deficits, moving all 4 extremities ?Psychiatric: Normal mood and affect ? ? ?Laboratory Data:  ?Recent Labs  ?  03/08/22 ?1139  ?WBC 15.0*  ?HGB 9.0*  ?HCT 28.5*  ? ?Recent Labs  ?  03/08/22 ?1139  ?NA 129*  ?K 3.9  ?CL 100  ?CO2 16*  ?GLUCOSE 95  ?BUN 74*  ?CREATININE 5.81*  ?CALCIUM 8.2*  ? ?No results for input(s): LABPT, INR in the last 72 hours. ?No results for input(s): LABURIN in the last 72 hours. ?Results for orders placed or performed during the hospital encounter of 03/04/22  ?Resp Panel by RT-PCR (Flu A&B, Covid) Nasopharyngeal Swab     Status: None  ? Collection Time: 03/04/22  9:27 AM  ? Specimen: Nasopharyngeal Swab; Nasopharyngeal(NP) swabs in vial transport medium  ?Result Value Ref Range Status  ? SARS Coronavirus 2 by RT PCR NEGATIVE NEGATIVE Final  ?  Comment: (NOTE) ?SARS-CoV-2 target nucleic acids are NOT DETECTED. ? ?The SARS-CoV-2 RNA is generally detectable in upper respiratory ?specimens during the acute phase of infection. The lowest ?concentration of SARS-CoV-2 viral copies this assay can detect is ?138 copies/mL. A negative result does not preclude SARS-Cov-2 ?infection and should not be used as the sole basis for treatment or ?other patient management decisions. A negative result may occur with  ?improper specimen collection/handling, submission of specimen other ?than  nasopharyngeal swab, presence of viral mutation(s) within the ?areas targeted by this assay, and inadequate number of viral ?copies(<138 copies/mL). A negative result must be combined with ?clinical observations, patient history, and epidemiological ?information. The expected result is Negative. ? ?Fact Sheet for Patients:  ?EntrepreneurPulse.com.au ? ?Fact Sheet for Healthcare Providers:  ?IncredibleEmployment.be ? ?This test is no t yet approved or cleared by the [place] FDA and  ?has been authorized for detection and/or diagnosis of SARS-CoV-2 by ?FDA under an Emergency Use Authorization (EUA). This EUA will remain  ?in effect (meaning this test can be used) for the duration of the ?COVID-19 declaration under Section 564(b)(1) of the Act, 21 ?U.S.C.section 360bbb-3(b)(1), unless the authorization is terminated  ?or revoked sooner.  ? ? ?  ? Influenza A by PCR NEGATIVE NEGATIVE Final  ? Influenza B by PCR NEGATIVE NEGATIVE Final  ?  Comment: (NOTE) ?The Xpert Xpress SARS-CoV-2/FLU/RSV plus  assay is intended as an aid ?in the diagnosis of influenza from Nasopharyngeal swab specimens and ?should not be used as a sole basis for treatment. Nasal washings and ?aspirates are unacceptable for Xpert Xpress SARS-CoV-2/FLU/RSV ?testing. ? ?Fact Sheet for Patients: ?EntrepreneurPulse.com.au ? ?Fact Sheet for Healthcare Providers: ?IncredibleEmployment.be ? ?This test is not yet approved or cleared by the [place] FDA and ?has been authorized for detection and/or diagnosis of SARS-CoV-2 by ?FDA under an Emergency Use Authorization (EUA). This EUA will remain ?in effect (meaning this test can be used) for the duration of the ?COVID-19 declaration under Section 564(b)(1) of the Act, 21 U.S.C. ?section 360bbb-3(b)(1), unless the authorization is terminated or ?revoked. ? ?Performed at [facility], [place], ?[place]  [address] ?  ? ? ? ?Radiologic Imaging: ?CT images were personally reviewed and interpreted ? ?CT ABDOMEN PELVIS WO CONTRAST ? ?Result Date: 03/08/2022 ?CLINICAL DATA:  Right lower quadrant pain. Diagnosis of renal stone Ma

## 2022-03-08 NOTE — Op Note (Signed)
Preoperative diagnosis:  ?Obstructing right proximal ureteral calculus ?Acute kidney injury ?Possible UTI ? ?Postoperative diagnosis:  ?Obstructing right proximal ureteral calculus ?Acute kidney injury ?Pyonephrosis ? ?Procedure: ?Cystoscopy with right retrograde pyelogram ?Right ureteroscopy ?Right ureteral stent placement (631F/26 cm) ? ?Surgeon: Abbie Sons, MD ? ?Anesthesia: MAC ? ?Complications: None ? ?Intraoperative findings:  ?Marked cystocele making ureteral localization difficult ?Right retrograde pyelogram-moderate hydronephrosis and hydroureter to the proximal ureter ?Impacted ureteral calculus ? ?EBL: Minimal ? ?Specimens: Urine right renal pelvis for culture ? ?Indication: Dana [name] is a 76 y.o. female recently diagnosed with a 5 mm right proximal ureteral calculus 03/04/2022.  She has had persistent pain, nausea and vomiting.  Evaluation today remarkable for leukocytosis at 15,000 and AKI with creatinine 5.8.  After reviewing the management options for treatment, he elected to proceed with the above surgical procedure(s). We have discussed the potential benefits and risks of the procedure, side effects of the proposed treatment, the likelihood of the patient achieving the goals of the procedure, and any potential problems that might occur during the procedure or recuperation. Informed consent has been obtained. ? ?Description of procedure: ? ?The patient was taken to the operating room and deep sedation was obtained by anesthesia.  The patient was placed in the dorsal lithotomy position, prepped and draped in the usual sterile fashion, and preoperative antibiotics were administered. A preoperative time-out was performed.  ? ?A 21 French cystoscope was lubricated and passed per urethra.  There was marked bladder descensus making visualization of the UOs difficult.  A sponge stick was placed in the vagina to elevate the bladder.  The right UO was identified and a 0.038 Sensor wire was placed  through the cystoscope and into the right ureteral orifice.  The wire was able to be advanced to the region of the renal pelvis. ? ?A 5 French open-ended ureteral catheter was advanced over the guidewire into the region of the renal pelvis.  The Sensor wire was removed and approximately 10 cc of purulent urine was aspirated.  Retrograde pyelogram was then performed with findings as described above.  The guidewire was replaced and the ureteral catheter was removed however in removing the ureteral catheter the guidewire pulled below the level of the stone and could not be advanced back into the renal pelvis. ? ?A 4.5 French semirigid ureteroscope was then passed per urethra into the right ureteral orifice.  The scope was able to be advanced to the proximal ureter without difficulty.  The stone was visualized and the guidewire was advanced through a small visualized opening at the 2 o'clock position. The guidewire was easily advanced into the renal pelvis.  The ureteroscope was removed and the 6 [place] open-ended ureteral catheter was readvanced to the renal pelvis.  The guidewire was removed and contrast was instilled to verify correct position.  Contrast was then instilled and no extravasation was identified.  The guidewire was replaced and the ureteral catheter was removed. ? ?The guidewire was backloaded on the cystoscope and a 631F/26 cm Contour ureteral stent was advanced over the wire.  A good curl was seen in the renal pelvis under fluoroscopy and the distal end of the stent was well positioned in the bladder. ? ?A 16 French Foley catheter was placed to maximize urine drainage. ? ?She was then transported to the PACU in stable condition. ? ?Recommendation: ?Antibiotics were not prescribed on admission ?She received ceftriaxone 1 g IV preop and I ordered ceftriaxone 1 g every 24 hours ?Foley catheter  drainage x48 hours ? ? ?Abbie Sons, M.D. ? ?

## 2022-03-08 NOTE — Anesthesia Preprocedure Evaluation (Addendum)
Anesthesia Evaluation  ?Patient identified by MRN, date of birth, ID band ?Patient awake ? ? ? ?Airway ?Mallampati: II ? ?TM Distance: >3 FB ?Neck ROM: Full ? ? ? Dental ? ?(+) Teeth Intact ?  ?Pulmonary ?neg pulmonary ROS,  ?  ? ? ? ? ? ? ? Cardiovascular ?hypertension, + Peripheral Vascular Disease  ? ? ?hyperlipidemia ?  ?Neuro/Psych ?negative neurological ROS ? negative psych ROS  ? GI/Hepatic ?  ?Endo/Other  ?diabetes, Type 1 ? Renal/GU ?Renal disease  ? ?  ?Musculoskeletal ? ? Abdominal ?  ?Peds ? Hematology ? ?(+) Blood dyscrasia, anemia ,   ?Anesthesia Other Findings ? ? Reproductive/Obstetrics ? ?  ? ? ? ? ? ? ? ? ? ? ? ? ? ?  ?  ? ? ? ? ? ? ? ?Anesthesia Physical ?Anesthesia Plan ? ?ASA: 3 and emergent ? ?Anesthesia Plan: General and MAC  ? ?Post-op Pain Management:   ? ?Induction: Intravenous ? ?PONV Risk Score and Plan:  ? ?Airway Management Planned: LMA and Oral ETT ? ?Additional Equipment:  ? ?Intra-op Plan:  ? ?Post-operative Plan: Extubation in OR ? ?Informed Consent: I have reviewed the patients History and Physical, chart, labs and discussed the procedure including the risks, benefits and alternatives for the proposed anesthesia with the patient or authorized representative who has indicated his/her understanding and acceptance.  ? ? ? ?Dental advisory given ? ?Plan Discussed with:  ? ?Anesthesia Plan Comments: (OSA score 2 ?MAC vs GEN - check with surgeon)  ? ? ? ? ?Anesthesia Quick Evaluation ? ?

## 2022-03-08 NOTE — Assessment & Plan Note (Addendum)
With renal insufficiency, patient has had recurrent hypoglycemic episodes. ?Levemir reduced 60>>30>>10 units daily. ?Discharged on lower dose of Levemir to prevent hypoglycemia.  Close PCP follow-up to adjust insulin as needed ?

## 2022-03-08 NOTE — Assessment & Plan Note (Addendum)
Presented with creatinine of 5.81.  Has baseline CKD stage IV, baseline creatinine about 1.8.  AKI felt likely to prerenal azotemia with continuation of ACE inhibitor and diuretic in addition to obstructive uropathy. ?Renal function slowly improving. ?-- Nephrology following ?-- Renal function continues to improve off IV fluids  ?-- Encourage PO hydration ?-- Repeat BMP at follow-up ?

## 2022-03-08 NOTE — ED Provider Notes (Signed)
? ?[facility] ?Provider Note ? ? ? Event Date/Time  ? First MD Initiated Contact with Patient 03/08/22 1143   ?  (approximate) ? ? ?History  ? ?Abdominal Pain ? ? ?HPI ? ?Dana [name] is a 76 y.o. female sent to ER for evaluation of abdominal pain right-sided with poor p.o. intake for the past several days worsening swelling denies any chest pain or shortness of breath.  Does have history of CKD with recent diagnosis of kidney stone denies any measured fevers or chills.  Has not been on any antibiotics.  Has taken Tylenol. ?  ? ? ?Physical Exam  ? ?Triage Vital Signs: ?ED Triage Vitals  ?Enc Vitals Group  ?   BP 03/08/22 1139 (!) 162/70  ?   Pulse Rate 03/08/22 1139 91  ?   Resp 03/08/22 1139 19  ?   Temp 03/08/22 1139 98.2 ?F (36.8 ?C)  ?   Temp Source 03/08/22 1139 Oral  ?   SpO2 03/08/22 1139 95 %  ?   Weight 03/08/22 1137 149 lb 14.6 oz (68 kg)  ?   Height 03/08/22 1137 5\' 3"  (1.6 m)  ?   Head Circumference --   ?   Peak Flow --   ?   Pain Score 03/08/22 1136 8  ?   Pain Loc --   ?   Pain Edu? --   ?   Excl. in [place]? --   ? ? ?Most recent vital signs: ?Vitals:  ? 03/08/22 1139 03/08/22 1335  ?BP: (!) 162/70 (!) 167/64  ?Pulse: 91   ?Resp: 19   ?Temp: 98.2 ?F (36.8 ?C)   ?SpO2: 95%   ? ? ? ?Constitutional: Alert  ?Eyes: Conjunctivae are normal.  ?Head: Atraumatic. ?Nose: No congestion/rhinnorhea. ?Mouth/Throat: Mucous membranes are moist.   ?Neck: Painless ROM.  ?Cardiovascular:   Good peripheral circulation. ?Respiratory: Normal respiratory effort.  No retractions.  ?Gastrointestinal: Soft and nontender.  ?Musculoskeletal:  no deformity ?Neurologic:  MAE spontaneously. No gross focal neurologic deficits are appreciated.  ?Skin:  Skin is warm, dry and intact. No rash noted. ?Psychiatric: Mood and affect are normal. Speech and behavior are normal. ? ? ? ?ED Results / Procedures / Treatments  ? ?Labs ?(all labs ordered are listed, but only abnormal results are displayed) ?Labs Reviewed   ?COMPREHENSIVE METABOLIC PANEL - Abnormal; Notable for the following components:  ?    Result Value  ? Sodium 129 (*)   ? CO2 16 (*)   ? BUN 74 (*)   ? Creatinine, Ser 5.81 (*)   ? Calcium 8.2 (*)   ? Albumin 2.7 (*)   ? GFR, Estimated 7 (*)   ? All other components within normal limits  ?CBC - Abnormal; Notable for the following components:  ? WBC 15.0 (*)   ? RBC 3.18 (*)   ? Hemoglobin 9.0 (*)   ? HCT 28.5 (*)   ? All other components within normal limits  ?LIPASE, BLOOD  ?URINALYSIS, ROUTINE W REFLEX MICROSCOPIC  ?ACETAMINOPHEN LEVEL  ? ? ? ?EKG ? ? ? ? ?RADIOLOGY ?Please see ED Course for my review and interpretation. ? ?I personally reviewed all radiographic images ordered to evaluate for the above acute complaints and reviewed radiology reports and findings.  These findings were personally discussed with the patient.  Please see medical record for radiology report. ? ? ? ?PROCEDURES: ? ?Critical Care performed: Yes, see critical care procedure note(s) ? ?.Critical Care ?Performed by: Merlyn Lot,  MD ?Authorized by: Merlyn Lot, MD  ? ?Critical care provider statement:  ?  Critical care time (minutes):  20 ?  Critical care was necessary to treat or prevent imminent or life-threatening deterioration of the following conditions:  Renal failure ?  Critical care was time spent personally by me on the following activities:  Ordering and performing treatments and interventions, ordering and review of laboratory studies, ordering and review of radiographic studies, pulse oximetry, re-evaluation of patient's condition, review of old charts, obtaining history from patient or surrogate, examination of patient, evaluation of patient's response to treatment, discussions with primary provider, discussions with consultants and development of treatment plan with patient or surrogate ? ? ?MEDICATIONS ORDERED IN ED: ?Medications  ?0.9 %  sodium chloride infusion (has no administration in time range)  ?sodium  chloride 0.9 % bolus 500 mL (500 mLs Intravenous New Bag/Given 03/08/22 1341)  ? ? ? ?IMPRESSION / MDM / ASSESSMENT AND PLAN / ED COURSE  ?I reviewed the triage vital signs and the nursing notes. ?             ?               ? ?Differential diagnosis includes, but is not limited to, aki, eledctrolyte abn, uti pyelo, ston, appy, divertciutlisi, vasculitis ? ?Patient presenting with symptoms as described above.  She nontoxic-appearing does have some mild edema.  Abdominal exam without any guarding or rebound we will repeat CT imaging will repeat blood work.  Will provide IV fluids as well as IV morphine and IV Zofran. ? ? ?Clinical Course as of 03/08/22 1348  ?Sat Mar 08, 2022  ?1319 Blood with evidence of acute renal failure mild hyponatremia also with worsening leukocytosis we will check urinalysis will give IV fluids we will repeat CT abdomen due to persistent pain.  Patient will require hospitalization. [PR]  ?19 CT abdomen by my review does not show any evidence of acute appendicitis.  I am concerned that she is developing worsening swelling and given her aki will require hospitalization for further work-up. [PR]  ?1345 I have consulted with hospitalist who agrees admit patient for further work-up. [PR]  ?  ?Clinical Course User Index ?[PR] Merlyn Lot, MD  ? ? ? ?FINAL CLINICAL IMPRESSION(S) / ED DIAGNOSES  ? ?Final diagnoses:  ?Acute renal failure, unspecified acute renal failure type ([place])  ? ? ? ?Rx / DC Orders  ? ?ED Discharge Orders   ? ? None  ? ?  ? ? ? ?Note:  This document was prepared using Dragon voice recognition software and may include unintentional dictation errors. ? ?  ?Merlyn Lot, MD ?03/08/22 1348 ? ?

## 2022-03-08 NOTE — Assessment & Plan Note (Addendum)
Status post right ureteral stent placement with urology on 3/18. ? ? ?

## 2022-03-08 NOTE — Assessment & Plan Note (Addendum)
Continue amlodipine.  ARB resumed, dose increased as BPs have been mildly elevated. ?Close primary care follow-up. ?

## 2022-03-08 NOTE — Assessment & Plan Note (Addendum)
Continue statin. 

## 2022-03-08 NOTE — H&P (Signed)
?  ?  ?[place] ? ? ?PATIENT NAME: Dana [name]   ? ?MR#:  [id number] ? ?DATE OF BIRTH:  03-24-1946 ? ?DATE OF ADMISSION:  03/08/2022 ? ?PRIMARY CARE PHYSICIAN: Gladstone Lighter, MD  ? ?Patient is coming from: Home ? ?REQUESTING/REFERRING PHYSICIAN: Merlyn Lot, MD ? ?CHIEF COMPLAINT:  ? ?Chief Complaint  ?Patient presents with  ?? Abdominal Pain  ? ? ?HISTORY OF PRESENT ILLNESS:  ?Dana [name] is a 76 y.o. African-American female with medical history significant for type 1 diabetes mellitus and hypertension, who presented to emergency room with acute onset of right lower quadrant abdominal pain which has been going on since Tuesday with associated anorexia.  She had nausea and vomiting on Tuesday.  She was seen in the ER and found to have right obstructive urolithiasis that she was told she can pass.  No fever or chills.  She denies any diarrhea or melena or bright red blood per rectum.  Her pain has been moving from the right lower quadrant to the right side today with persistent anorexia.  No chest pain or dyspnea or palpitations.  No cough or wheezing.  She has been having mild abdominal distention. ?. ?ED Course: Upon presenting to the emergency room, BP was 160/70 with otherwise normal vital signs.  Labs revealed hyponatremia of 129 and CO2 of 16 with a BUN of 74 and creatinine of 5.81 calcium of 8.2 and albumin 2.7. ?EKG as reviewed by me : EKG showed normal/rhythm with a rate of 77 with probable with atrial enlargement and poor R wave progression. ? ?Imaging: Abdominal pelvic CT scan showed the following: ?1. The 5 mm stone on the right at the L4 level is stable resulting ?in moderate right hydronephrosis and right ureterectasis, similar in ?the interval. ?2. The small pericardial effusion and bilateral pleural effusions ?are little larger in the interval. ?3. New mild ascites in the pelvis and mild edema in the mesenteric ?fat. ?4. Cholelithiasis. ?5. Colonic diverticulosis. ?6. Calcified  atherosclerosis in the nonaneurysmal aorta. ? ?The patient was given 500 mill IV normal saline bolus followed by 100 mill per hour.  She will be admitted to a medical telemetry bed for further evaluation and management. ? ?PAST MEDICAL HISTORY:  ? ?Past Medical History:  ?Diagnosis Date  ?? DM (diabetes mellitus), type 1 ([place])   ?? Hypertension   ? ? ?PAST SURGICAL HISTORY:  ? ?Past Surgical History:  ?Procedure Laterality Date  ?? HYSTERECTOMY ABDOMINAL WITH SALPINGO-OOPHORECTOMY    ? ? ?SOCIAL HISTORY:  ? ?Social History  ? ?Tobacco Use  ?? Smoking status: Never  ?? Smokeless tobacco: Never  ?Substance Use Topics  ?? Alcohol use: Never  ? ? ?FAMILY HISTORY:  ? ?Family History  ?Problem Relation Age of Onset  ?? Diabetes Mother   ?? Hypertension Father   ?? Diabetes Sister   ?? Diabetes Brother   ?? Diabetes Sister   ?? Hypertension Sister   ?? Diabetes Brother   ?? Diabetes Brother   ? ? ?DRUG ALLERGIES:  ? ?Allergies  ?Allergen Reactions  ?? Clindamycin Dermatitis and Rash  ?? Clindamycin/Lincomycin Rash  ?? Chocolate   ?  Nosebleed  ?? Chocolate Flavor   ?  Other reaction(s): Unknown ?Nosebleed  ?? Food Color Red   ?  Other reaction(s): Unknown ?Other reaction(s): Unknown  ?? Food Color Yellow   ?  Other reaction(s): Unknown ?Other reaction(s): Unknown  ?? Aspirin Rash  ?  Other reaction(s): Unknown: Document details in comments ?Pt states  her PCP told her she was allergic to ASA.  Pt does not remember having a reaction.  ?Pt states her PCP told her she was allergic to ASA.  Pt does not remember having a reaction.  ?  ? ? ?REVIEW OF SYSTEMS:  ? ?ROS ?As per history of present illness. All pertinent systems were reviewed above. Constitutional, HEENT, cardiovascular, respiratory, GI, GU, musculoskeletal, neuro, psychiatric, endocrine, integumentary and hematologic systems were reviewed and are otherwise negative/unremarkable except for positive findings mentioned above in the HPI. ? ? ?MEDICATIONS AT HOME:   ? ?Prior to Admission medications   ?Medication Sig Start Date End Date Taking? Authorizing Provider  ?amLODipine (NORVASC) 10 MG tablet TAKE 1 TABLET BY MOUTH EVERY DAY 04/22/21   Isaac Bliss, Rayford Halsted, MD  ?amLODipine (NORVASC) 10 MG tablet TAKE 1 TABLET BY MOUTH EVERY DAY 05/24/21   Isaac Bliss, Rayford Halsted, MD  ?amLODipine (NORVASC) 5 MG tablet TAKE 1 TABLET BY MOUTH EVERY DAY 10/29/20   Isaac Bliss, Rayford Halsted, MD  ?ASPIRIN 81 PO Take by mouth.    [provider]  ?hydrochlorothiazide (HYDRODIURIL) 25 MG tablet TAKE 1 TABLET BY MOUTH EVERY DAY 05/24/21   Isaac Bliss, Rayford Halsted, MD  ?HYDROcodone-acetaminophen (NORCO) 5-325 MG tablet Take 1 tablet by mouth every 4 (four) hours as needed for up to 5 days for moderate pain or severe pain. 03/04/22 03/09/22  Naaman Plummer, MD  ?LEVEMIR FLEXTOUCH 100 UNIT/ML FlexTouch Pen INJECT 60 UNITS INTO THE SKIN DAILY. 09/03/21   Isaac Bliss, Rayford Halsted, MD  ?lisinopril (ZESTRIL) 10 MG tablet TAKE 1 TABLET BY MOUTH EVERY DAY 10/30/20   Isaac Bliss, Rayford Halsted, MD  ?Omega-3 Fatty Acids (FISH OIL PO) Take by mouth.    [provider]  ?ondansetron (ZOFRAN-ODT) 8 MG disintegrating tablet Take 1 tablet (8 mg total) by mouth every 8 (eight) hours as needed for nausea or vomiting. 03/04/22   Bradler, Vista Lawman, MD  ?OZEMPIC, 0.25 OR 0.5 MG/DOSE, 2 MG/1.5ML SOPN INJECT 0.5 MG INTO THE SKIN ONCE A WEEK. 05/27/21   Isaac Bliss, Rayford Halsted, MD  ?tamsulosin (FLOMAX) 0.4 MG CAPS capsule Take 1 capsule (0.4 mg total) by mouth daily for 7 days. 03/04/22 03/11/22  Naaman Plummer, MD  ?TURMERIC PO Take by mouth.    [provider]  ? ?  ? ?VITAL SIGNS:  ?Blood pressure (!) 169/69, pulse 88, temperature 98.2 ?F (36.8 ?C), temperature source Oral, resp. rate 18, height 5\' 3"  (1.6 m), weight 68 kg, SpO2 96 %. ? ?PHYSICAL EXAMINATION:  ?Physical Exam ? ?GENERAL:  76 y.o.-year-old African-American female patient lying in the bed with no acute distress.  ?EYES:  Pupils equal, round, reactive to light and accommodation. No scleral icterus. Extraocular muscles intact.  ?HEENT: Head atraumatic, normocephalic. Oropharynx and nasopharynx clear.  ?NECK:  Supple, no jugular venous distention. No thyroid enlargement, no tenderness.  ?LUNGS: Normal breath sounds bilaterally, no wheezing, rales,rhonchi or crepitation. No use of accessory muscles of respiration.  ?CARDIOVASCULAR: Regular rate and rhythm, S1, S2 normal. No murmurs, rubs, or gallops.  ?ABDOMEN: Soft, mildly distended with positive shifting dullness, with right lower quadrant tenderness without rebound tenderness guarding or rigidity.   Bowel sounds present. No organomegaly or mass.  ?EXTREMITIES: No pedal edema, cyanosis, or clubbing.  ?NEUROLOGIC: Cranial nerves II through XII are intact. Muscle strength 5/5 in all extremities. Sensation intact. Gait not checked.  ?PSYCHIATRIC: The patient is alert and oriented x 3.  Normal affect and good  eye contact. ?SKIN: No obvious rash, lesion, or ulcer.  ? ?LABORATORY PANEL:  ? ?CBC ?Recent Labs  ?Lab 03/08/22 ?1139  ?WBC 15.0*  ?HGB 9.0*  ?HCT 28.5*  ?PLT 305  ? ?------------------------------------------------------------------------------------------------------------------ ? ?Chemistries  ?Recent Labs  ?Lab 03/08/22 ?1139  ?NA 129*  ?K 3.9  ?CL 100  ?CO2 16*  ?GLUCOSE 95  ?BUN 74*  ?CREATININE 5.81*  ?CALCIUM 8.2*  ?AST 20  ?ALT 14  ?ALKPHOS 85  ?BILITOT 0.7  ? ?------------------------------------------------------------------------------------------------------------------ ? ?Cardiac Enzymes ?No results for input(s): TROPONINI in the last 168 hours. ?------------------------------------------------------------------------------------------------------------------ ? ?RADIOLOGY:  ?CT ABDOMEN PELVIS WO CONTRAST ? ?Result Date: 03/08/2022 ?CLINICAL DATA:  Right lower quadrant pain. Diagnosis of renal stone March 04, 2022. EXAM: CT ABDOMEN AND PELVIS WITHOUT CONTRAST TECHNIQUE:  Multidetector CT imaging of the abdomen and pelvis was performed following the standard protocol without IV contrast. RADIATION DOSE REDUCTION: This exam was performed according to the departmental dose-optimization

## 2022-03-08 NOTE — ED Triage Notes (Signed)
Patient to ER via POV with complaints of RLQ pain and nausea since Tuesday. States she was previously seen in the ER and diagnosed with a kidney stone. Has been taking medications as prescribed but states she feels worse. Has also been having diarrhea. ? ?

## 2022-03-08 NOTE — Consult Note (Signed)
?[facility] ?CONSULT NOTE  ? ? ?Date: 03/08/2022      ?      ?      ?Patient Name:  Dana [name]  MRN: [id number]  ?DOB: 10-Feb-1946  Age / Sex: 76 y.o., female   ?      ?PCP: Gladstone Lighter, MD     ?      ?      ?Service Requesting Consult: medicine      ?      ?      ?Reason for Consult: AKI     ?      ? ?History of Present Illness: ?Patient is a 76 y.o. female with a PMHx of hypertension, coronary artery disease, diabetes, peripheral vascular disease, chronic kidney disease stage IIIb now comes back to the emergency room after discharge 3 days ago.  She was initially presented to the emergency room with flank pain associated with nausea and vomiting.  She was found to have right-sided nephrolithiasis with moderate hydronephrosis.  She was discharged home on Flomax. ? ?Since discharge patient continued to have nausea associated with some vomiting and diarrhea.  Patient gives history of diarrhea watery stools 4-5 times a day the last 3 days.  She has severe flank pains.  She denies any shaking chills or fevers.  But in general she does not feel well. ?Repeat CT scan today shows evidence of hydronephrosis.  She also has elevated WBC, metabolic acidosis and acute kidney injury.  She denies any chest pain or shortness of breath. ? ?She has been on ACE inhibitors and also thiazide diuretics as outpatient. ? ?Medications: ?Outpatient medications: ?(Not in a hospital admission) ? ? ?Current medications: ?Current Facility-Administered Medications  ?Medication Dose Route Frequency Provider Last Rate Last Admin  ? 0.9 %  sodium chloride infusion   Intravenous Once Merlyn Lot, MD      ? 0.9 %  sodium chloride infusion   Intravenous Continuous Mansy, Jan A, MD      ? acetaminophen (TYLENOL) tablet 650 mg  650 mg Oral Q6H PRN Mansy, Jan A, MD      ? Or  ? acetaminophen (TYLENOL) suppository 650 mg  650 mg Rectal Q6H PRN Mansy, Jan A, MD      ? amLODipine (NORVASC) tablet 10 mg  10 mg Oral  Daily Mansy, Jan A, MD      ? aspirin chewable tablet 81 mg  81 mg Oral Daily Mansy, Jan A, MD      ? heparin injection 5,000 Units  5,000 Units Subcutaneous Q8H Mansy, Jan A, MD      ? HYDROcodone-acetaminophen (NORCO/VICODIN) 5-325 MG per tablet 1 tablet  1 tablet Oral Q4H PRN Mansy, Jan A, MD      ? insulin detemir (LEVEMIR) FlexPen 60 Units  60 Units Subcutaneous Daily Mansy, Jan A, MD      ? magnesium hydroxide (MILK OF MAGNESIA) suspension 30 mL  30 mL Oral Daily PRN Mansy, Jan A, MD      ? omega-3 acid ethyl esters (LOVAZA) capsule 1 g  1 g Oral Daily Mansy, Jan A, MD      ? ondansetron [facility]) tablet 4 mg  4 mg Oral Q6H PRN Mansy, Jan A, MD      ? Or  ? ondansetron [facility]) injection 4 mg  4 mg Intravenous Q6H PRN Mansy, Jan A, MD      ? tamsulosin (FLOMAX) capsule 0.4 mg  0.4 mg Oral Daily Mansy,  Arvella Merles, MD      ? traZODone (DESYREL) tablet 25 mg  25 mg Oral QHS PRN Mansy, Arvella Merles, MD      ? ?Current Outpatient Medications  ?Medication Sig Dispense Refill  ? amLODipine (NORVASC) 10 MG tablet TAKE 1 TABLET BY MOUTH EVERY DAY 90 tablet 0  ? amLODipine (NORVASC) 10 MG tablet TAKE 1 TABLET BY MOUTH EVERY DAY 90 tablet 0  ? amLODipine (NORVASC) 5 MG tablet TAKE 1 TABLET BY MOUTH EVERY DAY 90 tablet 1  ? ASPIRIN 81 PO Take by mouth.    ? hydrochlorothiazide (HYDRODIURIL) 25 MG tablet TAKE 1 TABLET BY MOUTH EVERY DAY 90 tablet 0  ? HYDROcodone-acetaminophen (NORCO) 5-325 MG tablet Take 1 tablet by mouth every 4 (four) hours as needed for up to 5 days for moderate pain or severe pain. 20 tablet 0  ? LEVEMIR FLEXTOUCH 100 UNIT/ML FlexTouch Pen INJECT 60 UNITS INTO THE SKIN DAILY. 15 mL 0  ? lisinopril (ZESTRIL) 10 MG tablet TAKE 1 TABLET BY MOUTH EVERY DAY 90 tablet 1  ? Omega-3 Fatty Acids (FISH OIL PO) Take by mouth.    ? ondansetron (ZOFRAN-ODT) 8 MG disintegrating tablet Take 1 tablet (8 mg total) by mouth every 8 (eight) hours as needed for nausea or vomiting. 20 tablet 0  ? OZEMPIC, 0.25 OR 0.5 MG/DOSE, 2  MG/1.5ML SOPN INJECT 0.5 MG INTO THE SKIN ONCE A WEEK. 1.5 mL 1  ? tamsulosin (FLOMAX) 0.4 MG CAPS capsule Take 1 capsule (0.4 mg total) by mouth daily for 7 days. 7 capsule 0  ? TURMERIC PO Take by mouth.    ?  ? ? ?Allergies: ?Allergies  ?Allergen Reactions  ? Clindamycin Dermatitis and Rash  ? Clindamycin/Lincomycin Rash  ? Chocolate   ?  Nosebleed  ? Chocolate Flavor   ?  Other reaction(s): Unknown ?Nosebleed  ? Food Color Red   ?  Other reaction(s): Unknown ?Other reaction(s): Unknown  ? Food Color Yellow   ?  Other reaction(s): Unknown ?Other reaction(s): Unknown  ? Aspirin Rash  ?  Other reaction(s): Unknown: Document details in comments ?Pt states her PCP told her she was allergic to ASA.  Pt does not remember having a reaction.  ?Pt states her PCP told her she was allergic to ASA.  Pt does not remember having a reaction.  ?  ?  ? ? ?Past Medical History: ?Past Medical History:  ?Diagnosis Date  ? DM (diabetes mellitus), type 1 ([place])   ? Hypertension   ? ? ? ?Past Surgical History: ?Past Surgical History:  ?Procedure Laterality Date  ? HYSTERECTOMY ABDOMINAL WITH SALPINGO-OOPHORECTOMY    ? ? ? ?Family History: ?Family History  ?Problem Relation Age of Onset  ? Diabetes Mother   ? Hypertension Father   ? Diabetes Sister   ? Diabetes Brother   ? Diabetes Sister   ? Hypertension Sister   ? Diabetes Brother   ? Diabetes Brother   ? ? ? ?Social History: ?Social History  ? ?Socioeconomic History  ? Marital status: Married  ?  Spouse name: Not on file  ? Number of children: Not on file  ? Years of education: Not on file  ? Highest education level: Not on file  ?Occupational History  ? Not on file  ?Tobacco Use  ? Smoking status: Never  ? Smokeless tobacco: Never  ?Vaping Use  ? Vaping Use: Never used  ?Substance and Sexual Activity  ? Alcohol use: Never  ? Drug  use: Never  ? Sexual activity: Not on file  ?Other Topics Concern  ? Not on file  ?Social History Narrative  ? Not on file  ? ?Social Determinants of Health   ? ?Financial Resource Strain: Not on file  ?Food Insecurity: Not on file  ?Transportation Needs: Not on file  ?Physical Activity: Not on file  ?Stress: Not on file  ?Social Connections: Not on file  ?Intimate Partner Violence: Not on file  ? ? ? ?Review of Systems: ?As per HPI ? ?Vital Signs: ?Blood pressure (!) 167/64, pulse 91, temperature 98.2 ?F (36.8 ?C), temperature source Oral, resp. rate 19, height 5\' 3"  (1.6 m), weight 68 kg, SpO2 95 %. ? ?Weight trends: ?Filed Weights  ? 03/08/22 1137  ?Weight: 68 kg  ? ? ?Physical Exam: ?Physical Exam: ?General:  No acute distress  ?Head:  Normocephalic, atraumatic. Moist oral mucosal membranes  ?Eyes:  Anicteric  ?Neck:  Supple  ?Lungs:   Clear to auscultation, normal effort  ?Heart:  S1S2 no rubs  ?Abdomen:   Soft, nontender, bowel sounds present  ?Extremities:  peripheral edema.  ?Neurologic:  Awake, alert, following commands  ?Skin:  No lesions  ?Access:   ? ? ?Lab results: ? ?Basic Metabolic Panel: ?Recent Labs  ?Lab 03/04/22 ?0928 03/08/22 ?1139  ?NA 139 129*  ?K 4.2 3.9  ?CL 113* 100  ?CO2 20* 16*  ?GLUCOSE 171* 95  ?BUN 35* 74*  ?CREATININE 2.50* 5.81*  ?CALCIUM 8.6* 8.2*  ? ? ?CBC: ?Recent Labs  ?Lab 03/04/22 ?0928 03/08/22 ?1139  ?WBC 8.2 15.0*  ?NEUTROABS 6.2  --   ?HGB 9.6* 9.0*  ?HCT 31.2* 28.5*  ?MCV 91.8 89.6  ?PLT 273 305  ? ? ?Microbiology: ?Results for orders placed or performed during the hospital encounter of 03/04/22  ?Resp Panel by RT-PCR (Flu A&B, Covid) Nasopharyngeal Swab     Status: None  ? Collection Time: 03/04/22  9:27 AM  ? Specimen: Nasopharyngeal Swab; Nasopharyngeal(NP) swabs in vial transport medium  ?Result Value Ref Range Status  ? SARS Coronavirus 2 by RT PCR NEGATIVE NEGATIVE Final  ?  Comment: (NOTE) ?SARS-CoV-2 target nucleic acids are NOT DETECTED. ? ?The SARS-CoV-2 RNA is generally detectable in upper respiratory ?specimens during the acute phase of infection. The lowest ?concentration of SARS-CoV-2 viral copies this assay can  detect is ?138 copies/mL. A negative result does not preclude SARS-Cov-2 ?infection and should not be used as the sole basis for treatment or ?other patient management decisions. A negative result may occur with

## 2022-03-08 NOTE — Transfer of Care (Signed)
Immediate Anesthesia Transfer of Care Note ? ?Patient: Dana [name] ? ?Procedure(s) Performed: CYSTOSCOPY WITH RIGHT URETERALSTENT PLACEMENT (Right) ?URETEROSCOPY (Right) ? ?Patient Location: PACU ? ?Anesthesia Type:MAC ? ?Level of Consciousness: drowsy and patient cooperative ? ?Airway & Oxygen Therapy: Patient Spontanous Breathing and Patient connected to nasal cannula oxygen ? ?Post-op Assessment: Report given to RN and Post -op Vital signs reviewed and stable ? ?Post vital signs: Reviewed and stable ? ?Last Vitals:  ?Vitals Value Taken Time  ?BP 122/63 2313  ?Temp 99.68f  ?Pulse 84 03/08/22 2311  ?Resp 14 03/08/22 2311  ?SpO2 100 % 03/08/22 2311  ?Vitals shown include unvalidated device data. ? ?Last Pain:  ?Vitals:  ? 03/08/22 1757  ?TempSrc:   ?PainSc: 8   ?   ? ?  ? ?Complications: No notable events documented. ?

## 2022-03-09 ENCOUNTER — Encounter: Payer: Self-pay | Admitting: Urology

## 2022-03-09 DIAGNOSIS — N139 Obstructive and reflux uropathy, unspecified: Secondary | ICD-10-CM | POA: Diagnosis not present

## 2022-03-09 DIAGNOSIS — N179 Acute kidney failure, unspecified: Secondary | ICD-10-CM | POA: Diagnosis not present

## 2022-03-09 DIAGNOSIS — E1021 Type 1 diabetes mellitus with diabetic nephropathy: Secondary | ICD-10-CM | POA: Diagnosis not present

## 2022-03-09 LAB — GLUCOSE, CAPILLARY: Glucose-Capillary: 140 mg/dL — ABNORMAL HIGH (ref 70–99)

## 2022-03-09 LAB — CBC
HCT: 22.1 % — ABNORMAL LOW (ref 36.0–46.0)
Hemoglobin: 7.2 g/dL — ABNORMAL LOW (ref 12.0–15.0)
MCH: 28.6 pg (ref 26.0–34.0)
MCHC: 32.6 g/dL (ref 30.0–36.0)
MCV: 87.7 fL (ref 80.0–100.0)
Platelets: 278 10*3/uL (ref 150–400)
RBC: 2.52 MIL/uL — ABNORMAL LOW (ref 3.87–5.11)
RDW: 13.1 % (ref 11.5–15.5)
WBC: 9.9 10*3/uL (ref 4.0–10.5)
nRBC: 0 % (ref 0.0–0.2)

## 2022-03-09 LAB — BASIC METABOLIC PANEL
Anion gap: 8 (ref 5–15)
BUN: 70 mg/dL — ABNORMAL HIGH (ref 8–23)
CO2: 16 mmol/L — ABNORMAL LOW (ref 22–32)
Calcium: 7.6 mg/dL — ABNORMAL LOW (ref 8.9–10.3)
Chloride: 108 mmol/L (ref 98–111)
Creatinine, Ser: 5.34 mg/dL — ABNORMAL HIGH (ref 0.44–1.00)
GFR, Estimated: 8 mL/min — ABNORMAL LOW (ref >60)
Glucose, Bld: 137 mg/dL — ABNORMAL HIGH (ref 70–99)
Potassium: 3.8 mmol/L (ref 3.5–5.1)
Sodium: 132 mmol/L — ABNORMAL LOW (ref 135–145)

## 2022-03-09 MED ORDER — HYOSCYAMINE SULFATE 0.125 MG SL SUBL
0.1250 mg | SUBLINGUAL_TABLET | SUBLINGUAL | Status: DC | PRN
  Filled 2022-03-09: qty 1

## 2022-03-09 MED ORDER — CHLORHEXIDINE GLUCONATE CLOTH 2 % EX PADS
6.0000 | MEDICATED_PAD | Freq: Every day | CUTANEOUS | Status: DC
  Administered 2022-03-09: 6 via TOPICAL

## 2022-03-09 MED ORDER — SODIUM CHLORIDE 0.9 % IV SOLN
1.0000 g | INTRAVENOUS | Status: DC
  Administered 2022-03-09: 1 g via INTRAVENOUS
  Filled 2022-03-09: qty 10

## 2022-03-09 NOTE — Progress Notes (Signed)
?[facility]  ?PROGRESS NOTE  ? ?Subjective:  ? ?Events noted.  S/p cystoscopy with stent placement on the right ureter.  ?Feels much better today.  Family at bedside. ? ?Objective:  ?Vital signs: ?Blood pressure 128/64, pulse 84, temperature 99.6 ?F (37.6 ?C), resp. rate 18, height 5\' 3"  (1.6 m), weight 68 kg, SpO2 99 %. ? ?Intake/Output Summary (Last 24 hours) at 03/09/2022 1351 ?Last data filed at 03/09/2022 0533 ?Gross per 24 hour  ?Intake 867.25 ml  ?Output 1575 ml  ?Net -707.75 ml  ? ?Filed Weights  ? 03/08/22 1137  ?Weight: 68 kg  ? ? ? ?Physical Exam: ?General:  No acute distress  ?Head:  Normocephalic, atraumatic. Moist oral mucosal membranes  ?Eyes:  Anicteric  ?Neck:  Supple  ?Lungs:   Clear to auscultation, normal effort  ?Heart:  S1S2 no rubs  ?Abdomen:   Soft, nontender, bowel sounds present  ?Extremities:  peripheral edema.  ?Neurologic:  Awake, alert, following commands  ?Skin:  No lesions  ?Access:   ? ? ?Basic Metabolic Panel: ?Recent Labs  ?Lab 03/04/22 ?0928 03/08/22 ?1139 03/09/22 ?0409  ?NA 139 129* 132*  ?K 4.2 3.9 3.8  ?CL 113* 100 108  ?CO2 20* 16* 16*  ?GLUCOSE 171* 95 137*  ?BUN 35* 74* 70*  ?CREATININE 2.50* 5.81* 5.34*  ?CALCIUM 8.6* 8.2* 7.6*  ? ? ?CBC: ?Recent Labs  ?Lab 03/04/22 ?0928 03/08/22 ?1139 03/09/22 ?0409  ?WBC 8.2 15.0* 9.9  ?NEUTROABS 6.2  --   --   ?HGB 9.6* 9.0* 7.2*  ?HCT 31.2* 28.5* 22.1*  ?MCV 91.8 89.6 87.7  ?PLT 273 305 278  ? ? ? ?Urinalysis: ?Recent Labs  ?  03/08/22 ?1849  ?COLORURINE STRAW*  ?LABSPEC 1.010  ?PHURINE 5.0  ?GLUCOSEU 50*  ?HGBUR MODERATE*  ?BILIRUBINUR NEGATIVE  ?KETONESUR 5*  ?PROTEINUR 100*  ?NITRITE NEGATIVE  ?LEUKOCYTESUR NEGATIVE  ?  ? ? ?Imaging: ?CT ABDOMEN PELVIS WO CONTRAST ? ?Result Date: 03/08/2022 ?CLINICAL DATA:  Right lower quadrant pain. Diagnosis of renal stone March 04, 2022. EXAM: CT ABDOMEN AND PELVIS WITHOUT CONTRAST TECHNIQUE: Multidetector CT imaging of the abdomen and pelvis was performed following the standard  protocol without IV contrast. RADIATION DOSE REDUCTION: This exam was performed according to the departmental dose-optimization program which includes automated exposure control, adjustment of the mA and/or kV according to patient size and/or use of iterative reconstruction technique. COMPARISON:  March 04, 2022 CT scan FINDINGS: Lower chest: There is a small pericardial effusion measuring up to 10 mm today versus 5 mm previously. Small bilateral pleural effusions are larger in the interval as well. Hepatobiliary: Numerous stones are seen in the gallbladder. The liver is unremarkable. Pancreas: Unremarkable. No pancreatic ductal dilatation or surrounding inflammatory changes. Spleen: Normal in size without focal abnormality. Adrenals/Urinary Tract: Adrenal glands are normal. No renal stones are identified. Moderate right hydronephrosis and right ureterectasis identified due to a 5 mm stone located at the L4 level, stable. No other ureteral stones identified. A cyst is seal is identified. The bladder is otherwise normal. Stomach/Bowel: There is a small hiatal hernia. The stomach and small bowel are normal. Colonic diverticulosis is identified without diverticulitis. The appendix is not visualized. Vascular/Lymphatic: Calcified atherosclerosis is seen in the nonaneurysmal aorta. No adenopathy. Reproductive: Status post hysterectomy. No adnexal masses. Other: Ascites is seen in the pelvis, new in the interval. Increased attenuation in the mesenteric fat is a new finding. Bilateral perinephric stranding is stable. Musculoskeletal: No acute or significant osseous findings. IMPRESSION:  1. The 5 mm stone on the right at the L4 level is stable resulting in moderate right hydronephrosis and right ureterectasis, similar in the interval. 2. The small pericardial effusion and bilateral pleural effusions are little larger in the interval. 3. New mild ascites in the pelvis and mild edema in the mesenteric fat. 4. Cholelithiasis.  5. Colonic diverticulosis. 6. Calcified atherosclerosis in the nonaneurysmal aorta. Electronically Signed   By: Dorise Bullion III M.D.   On: 03/08/2022 13:33  ? ?US RENAL ? ?Result Date: 03/08/2022 ?CLINICAL DATA:  Renal failure. EXAM: RENAL / URINARY TRACT ULTRASOUND COMPLETE COMPARISON:  CT scan March 08, 2022 FINDINGS: Right Kidney: Renal measurements: 12.2 x 5.8 x 5.9 cm = volume: 222 mL. Mild hydronephrosis is less prominent compared to today's CT scan. The difference may be due to difference in technique. Left Kidney: Renal measurements: 11.1 x 7.0 x 6.1 cm = volume: 246 mL. Echogenicity within normal limits. No mass or hydronephrosis visualized. Bladder: Appears normal for degree of bladder distention. Other: None. IMPRESSION: 1. Mild hydronephrosis is identified on the right. The hydronephrosis on the right is less prominent compared to the CT scan from earlier today. The difference could be due to difference in technique. 2. The left kidney is unremarkable with no evidence of obstruction. 3. No other abnormalities. Electronically Signed   By: Dorise Bullion III M.D.   On: 03/08/2022 15:51  ? ?DG Chest Portable 1 View ? ?Result Date: 03/08/2022 ?CLINICAL DATA:  Abdominal pain.  By for infiltrate. EXAM: PORTABLE CHEST 1 VIEW COMPARISON:  December 18, 2019 FINDINGS: Stable cardiomegaly. The hila and mediastinum are unchanged and unremarkable. No pneumothorax. Thickening of the right fissure, new. No nodules or masses. No focal infiltrates. IMPRESSION: Thickening of the right fissure could represent pleural thickening versus a small about of fluid in the fissure, new since December 18, 2019. No other abnormalities or changes are identified. Electronically Signed   By: Dorise Bullion III M.D.   On: 03/08/2022 15:04  ? ?DG OR UROLOGY CYSTO IMAGE ([place]) ? ?Result Date: 03/08/2022 ?There is no interpretation for this exam.  This order is for images obtained during a surgical procedure.  Please See "Surgeries"  Tab for more information regarding the procedure.   ? ? ?Medications:  ? ? sodium chloride 100 mL/hr at 03/09/22 0041  ? cefTRIAXone (ROCEPHIN)  IV    ? ? amLODipine  10 mg Oral Daily  ? heparin injection (subcutaneous)  5,000 Units Subcutaneous Q8H  ? insulin detemir  60 Units Subcutaneous Daily  ? omega-3 acid ethyl esters  1 g Oral Daily  ? tamsulosin  0.4 mg Oral Daily  ? ? ?Assessment/ Plan:  ?   ?Principal Problem: ?  AKI (acute kidney injury) ([place]) ?Active Problems: ?  DM (diabetes mellitus), type 1 ([place]) ?  Hypertension ?  Dyslipidemia ?  Acute unilateral obstructive uropathy ? ?76 y.o. female with a PMHx of hypertension, coronary artery disease, diabetes, peripheral vascular disease, chronic kidney disease stage IIIb now comes back to the emergency room after discharge 3 days ago.  She was initially presented to the emergency room with flank pain associated with nausea and vomiting.  She was found to have right-sided nephrolithiasis with moderate hydronephrosis.  ?  ?#1: Acute kidney injury on chronic kidney disease: Patient has CKD stage IIIb which now has worsened. The acute kidney injury is most likely secondary to severe prerenal azotemia complicated by the use of ACE inhibitor's and diuretics.  We  will continue the IV fluids with normal saline at 100 cc an hour. ?  ?#2: Acidosis: The metabolic acidosis possibly can be due to acute kidney injury complicated by sepsis. ?  ?#3: Nephrolithiasis/hydronephrosis/sepsis: Patient is now s/p urological procedure and placement of right ureteral stent. Urine output is good today.  Continue the Flomax. ? ?#4: Hypertension: Patient has been on ACE inhibitor's and diuretics which should be placed on hold for now.  We can continue the amlodipine and can start on clonidine or hydralazine if need be ?  ?#5: Diabetes: Continue insulin as ordered.  She is now off of SGLT2 inhibitors. ?  ?#6: Hyponatremia: Possibly secondary to decreased p.o. intake and thiazide diuretics.   We will continue the isotonic saline for now. ?  ?#7: Anemia: Anemia most likely secondary to chronic kidney disease. Will monitor closely. ? ?Spoke to the patient and her family at bedside in detail.

## 2022-03-09 NOTE — Anesthesia Postprocedure Evaluation (Signed)
Anesthesia Post Note ? ?Patient: Dana [name] ? ?Procedure(s) Performed: CYSTOSCOPY WITH RIGHT URETERALSTENT PLACEMENT (Right) ?URETEROSCOPY (Right) ? ?Patient location during evaluation: PACU ?Anesthesia Type: General and MAC ?Level of consciousness: awake ?Pain management: pain level controlled ?Vital Signs Assessment: post-procedure vital signs reviewed and stable ?Respiratory status: spontaneous breathing and nonlabored ventilation ?Cardiovascular status: blood pressure returned to baseline ?Anesthetic complications: no ? ? ?No notable events documented. ? ? ?Last Vitals:  ?Vitals:  ? 03/09/22 0108 03/09/22 0210  ?BP: 127/68 119/60  ?Pulse: 88 82  ?Resp: 20 20  ?Temp: 36.9 ?C 36.8 ?C  ?SpO2: 98% 97%  ?  ?Last Pain:  ?Vitals:  ? 03/09/22 0033  ?TempSrc:   ?PainSc: 0-No pain  ? ? ?  ?  ?  ?  ?  ?  ? ?Deno Etienne ? ? ? ? ?

## 2022-03-09 NOTE — Progress Notes (Signed)
Urology Consult Follow Up ? ?Subjective: ?States she "feels 100% better" ?Flank pain resolved ?Max temp 99.6, VSS ? ?Anti-infectives: ?Anti-infectives (From admission, onward)  ? ? Start     Dose/Rate Route Frequency Ordered Stop  ? 03/09/22 2000  cefTRIAXone (ROCEPHIN) 1 g in sodium chloride 0.9 % 100 mL IVPB       ? 1 g ?200 mL/hr over 30 Minutes Intravenous Every 24 hours 03/09/22 0006    ? 03/08/22 1845  cefTRIAXone (ROCEPHIN) 1 g in sodium chloride 0.9 % 100 mL IVPB       ? 1 g ?200 mL/hr over 30 Minutes Intravenous  Once 03/08/22 1754 03/08/22 2135  ? ?  ? ? ?Current Facility-Administered Medications  ?Medication Dose Route Frequency Provider Last Rate Last Admin  ? 0.9 %  sodium chloride infusion   Intravenous Continuous Parsa Rickett, Ronda Fairly, MD 100 mL/hr at 03/09/22 0041 Restarted at 03/09/22 0041  ? acetaminophen (TYLENOL) tablet 650 mg  650 mg Oral Q6H PRN Satish Hammers C, MD      ? Or  ? acetaminophen (TYLENOL) suppository 650 mg  650 mg Rectal Q6H PRN Malaijah Houchen C, MD      ? amLODipine (NORVASC) tablet 10 mg  10 mg Oral Daily Arlys Scatena C, MD   10 mg at 03/09/22 6389  ? cefTRIAXone (ROCEPHIN) 1 g in sodium chloride 0.9 % 100 mL IVPB  1 g Intravenous Q24H Dekari Bures C, MD      ? heparin injection 5,000 Units  5,000 Units Subcutaneous Q8H Yolando Gillum, Ronda Fairly, MD   5,000 Units at 03/09/22 0527  ? HYDROcodone-acetaminophen (NORCO/VICODIN) 5-325 MG per tablet 1 tablet  1 tablet Oral Q4H PRN Dahl Higinbotham C, MD   1 tablet at 03/08/22 1757  ? hyoscyamine (LEVSIN SL) SL tablet 0.125 mg  0.125 mg Sublingual Q4H PRN Rameen Quinney C, MD      ? insulin detemir (LEVEMIR) injection 60 Units  60 Units Subcutaneous Daily Randalyn Ahmed C, MD   60 Units at 03/09/22 3734  ? magnesium hydroxide (MILK OF MAGNESIA) suspension 30 mL  30 mL Oral Daily PRN Didi Ganaway C, MD      ? omega-3 acid ethyl esters (LOVAZA) capsule 1 g  1 g Oral Daily Laysa Kimmey C, MD   1 g at 03/09/22 0926  ? ondansetron (ZOFRAN) tablet  4 mg  4 mg Oral Q6H PRN Donna Silverman C, MD      ? Or  ? ondansetron (ZOFRAN) injection 4 mg  4 mg Intravenous Q6H PRN Khaleef Ruby C, MD      ? tamsulosin (FLOMAX) capsule 0.4 mg  0.4 mg Oral Daily Maimouna Rondeau C, MD   0.4 mg at 03/09/22 2876  ? traZODone (DESYREL) tablet 25 mg  25 mg Oral QHS PRN Shine Scrogham, Ronda Fairly, MD      ? ? ? ?Objective: ?Vital signs in last 24 hours: ?Temp:  [97.6 ?F (36.4 ?C)-99.6 ?F (37.6 ?C)] 99.6 ?F (37.6 ?C) (03/19 0802) ?Pulse Rate:  [78-104] 84 (03/19 0802) ?Resp:  [14-20] 18 (03/19 0457) ?BP: (114-172)/(60-71) 128/64 (03/19 0802) ?SpO2:  [96 %-100 %] 99 % (03/19 0802) ? ?Intake/Output from previous day: ?03/18 0701 - 03/19 0700 ?In: 867.3 [P.O.:240; I.V.:627.3] ?Out: 1575 [OTLXB:2620] ?Intake/Output this shift: ?No intake/output data recorded. ? ? ?Physical Exam: ?In no acute distress ?Foley catheter draining blood-tinged urine ? ?Lab Results:  ?Recent Labs  ?  03/08/22 ?1139 03/09/22 ?0409  ?WBC 15.0* 9.9  ?HGB 9.0* 7.2*  ?  HCT 28.5* 22.1*  ?PLT 305 278  ? ?BMET ?Recent Labs  ?  03/08/22 ?1139 03/09/22 ?0409  ?NA 129* 132*  ?K 3.9 3.8  ?CL 100 108  ?CO2 16* 16*  ?GLUCOSE 95 137*  ?BUN 74* 70*  ?CREATININE 5.81* 5.34*  ?CALCIUM 8.2* 7.6*  ? ? ? ? ? ?Assessment: ?Improved status post right ureteral stent placement ?Leukocytosis resolved, creatinine improved slightly ?Urine culture pending ? ?Recommendation: ?Okay to advance diet from urologic standpoint ?Okay to DC Foley catheter in a.m. from urologic standpoint ? ? ? ? LOS: 1 day  ? ? ?Biagio Snelson C Bethanne Mule ?03/09/2022 ? ?

## 2022-03-09 NOTE — Progress Notes (Signed)
Progress Note    Dana [name]  NWG:[id number] DOB: 1946/01/27  DOA: 03/08/2022 PCP: Enid Baas, MD      Brief Narrative:    Medical records reviewed and are as summarized below:  Dana [name] is a 76 y.o. female with medical history significant for type 1 diabetes mellitus, hypertension, CKD stage III, who presented to the emergency room on 314 with right lower quadrant abdominal pain, nausea and vomiting.  She was found to have a 5 mm right proximal ureteral calculus with mild hydronephrosis/hydroureter.  She was treated with analgesics and was discharged home from the ED on hydrocodone, Flomax and Zofran.  She presented again to the hospital on 03/08/2022 with nausea, vomiting and worsening right lower quadrant pain radiating to the right flank.  Repeat CT abdomen and pelvis showed persistent 5 mm right proximal ureteral stone with moderate right hydronephrosis.  She was also found to have AKI, likely postobstructive uropathy.  She was treated with IV fluids and empiric IV antibiotics.  She underwent right ureteroscopy and right ureteral stent placement.  10 mL of purulent urine was aspirated from the renal pelvis during the procedure.        Assessment/Plan:   Principal Problem:   AKI (acute kidney injury) (HCC) Active Problems:   Acute unilateral obstructive uropathy   DM (diabetes mellitus), type 1 (HCC)   Hypertension   Dyslipidemia    Body mass index is 26.56 kg/m.    AKI on CKD stage III, obstructive uropathy: Continue IV fluids and monitor BMP.  Urine protein creatinine ratio on 02/05/2022 was 8,170 concerning for nephrotic syndrome.  Follow-up with nephrologist.  5 mm right proximal ureteral stone, moderate right hydronephrosis: S/p right ureteral stent placement on 03/08/2022  Acute complicated UTI/pyonephrosis: Continue IV Rocephin.  Urine culture is pending.  Type I DM: Continue Levemir.  Use NovoLog as needed for hyperglycemia.  Anemia of  chronic disease: Monitor H&H.  Recent iron studies was unremarkable.  Incidental finding of small pericardial effusion and small bilateral pleural effusion:  She is hemodynamically stable  Other comorbidities include hypertension, hyperlipidemia  Diet Order             Diet clear liquid Room service appropriate? Yes; Fluid consistency: Thin  Diet effective now                      Consultants: Urologist Nephrologist  Procedures: Right ureteroscopy with right ureteral stent placement on 03/08/2022    Medications:    amLODipine  10 mg Oral Daily   heparin injection (subcutaneous)  5,000 Units Subcutaneous Q8H   insulin detemir  60 Units Subcutaneous Daily   omega-3 acid ethyl esters  1 g Oral Daily   tamsulosin  0.4 mg Oral Daily   Continuous Infusions:  sodium chloride 100 mL/hr at 03/09/22 0041   cefTRIAXone (ROCEPHIN)  IV       Anti-infectives (From admission, onward)    Start     Dose/Rate Route Frequency Ordered Stop   03/09/22 2000  cefTRIAXone (ROCEPHIN) 1 g in sodium chloride 0.9 % 100 mL IVPB        1 g 200 mL/hr over 30 Minutes Intravenous Every 24 hours 03/09/22 0006     03/08/22 1845  cefTRIAXone (ROCEPHIN) 1 g in sodium chloride 0.9 % 100 mL IVPB        1 g 200 mL/hr over 30 Minutes Intravenous  Once 03/08/22 1754 03/08/22 2135  Family Communication/Anticipated D/C date and plan/Code Status   DVT prophylaxis: heparin injection 5,000 Units Start: 03/08/22 1430     Code Status: Full Code  Family Communication: Husband at the bedside Disposition Plan: Plan to discharge home in 1 to 2 days   Status is: Inpatient Remains inpatient appropriate because: On IV fluids, monitor kidney function       Subjective:   Interval events noted.  Abdominal pain is better.  No nausea or vomiting.  Objective:    Vitals:   03/09/22 0108 03/09/22 0210 03/09/22 0457 03/09/22 0802  BP: 127/68 119/60 124/63 128/64  Pulse: 88 82  78 84  Resp: 20 20 18    Temp: 98.4 F (36.9 C) 98.3 F (36.8 C) 98.4 F (36.9 C) 99.6 F (37.6 C)  TempSrc:      SpO2: 98% 97% 97% 99%  Weight:      Height:       No data found.   Intake/Output Summary (Last 24 hours) at 03/09/2022 1145 Last data filed at 03/09/2022 0533 Gross per 24 hour  Intake 867.25 ml  Output 1575 ml  Net -707.75 ml   Filed Weights   03/08/22 1137  Weight: 68 kg    Exam:  GEN: NAD SKIN: Warm and dry EYES: EOMI ENT: MMM CV: RRR PULM: CTA B ABD: soft, ND, NT, +BS CNS: AAO x 3, non focal EXT: No edema or tenderness GU: Foley catheter draining amber urine        Data Reviewed:   I have personally reviewed following labs and imaging studies:  Labs: Labs show the following:   Basic Metabolic Panel: Recent Labs  Lab 03/04/22 0928 03/08/22 1139 03/09/22 0409  NA 139 129* 132*  K 4.2 3.9 3.8  CL 113* 100 108  CO2 20* 16* 16*  GLUCOSE 171* 95 137*  BUN 35* 74* 70*  CREATININE 2.50* 5.81* 5.34*  CALCIUM 8.6* 8.2* 7.6*   GFR Estimated Creatinine Clearance: 8.3 mL/min (A) (by C-G formula based on SCr of 5.34 mg/dL (H)). Liver Function Tests: Recent Labs  Lab 03/04/22 0928 03/08/22 1139  AST 34 20  ALT 20 14  ALKPHOS 88 85  BILITOT 0.6 0.7  PROT 7.2 7.0  ALBUMIN 3.0* 2.7*   Recent Labs  Lab 03/04/22 0928 03/08/22 1139  LIPASE 23 22   No results for input(s): AMMONIA in the last 168 hours. Coagulation profile No results for input(s): INR, PROTIME in the last 168 hours.  CBC: Recent Labs  Lab 03/04/22 0928 03/08/22 1139 03/09/22 0409  WBC 8.2 15.0* 9.9  NEUTROABS 6.2  --   --   HGB 9.6* 9.0* 7.2*  HCT 31.2* 28.5* 22.1*  MCV 91.8 89.6 87.7  PLT 273 305 278   Cardiac Enzymes: No results for input(s): CKTOTAL, CKMB, CKMBINDEX, TROPONINI in the last 168 hours. BNP (last 3 results) No results for input(s): PROBNP in the last 8760 hours. CBG: Recent Labs  Lab 03/08/22 2318 03/09/22 0356  GLUCAP 77 140*    D-Dimer: No results for input(s): DDIMER in the last 72 hours. Hgb A1c: No results for input(s): HGBA1C in the last 72 hours. Lipid Profile: No results for input(s): CHOL, HDL, LDLCALC, TRIG, CHOLHDL, LDLDIRECT in the last 72 hours. Thyroid function studies: No results for input(s): TSH, T4TOTAL, T3FREE, THYROIDAB in the last 72 hours.  Invalid input(s): FREET3 Anemia work up: No results for input(s): VITAMINB12, FOLATE, FERRITIN, TIBC, IRON, RETICCTPCT in the last 72 hours. Sepsis Labs: Recent Labs  Lab  03/04/22 0928 03/08/22 1139 03/09/22 0409  WBC 8.2 15.0* 9.9  LATICACIDVEN 1.0  --   --     Microbiology Recent Results (from the past 240 hour(s))  Resp Panel by RT-PCR (Flu A&B, Covid) Nasopharyngeal Swab     Status: None   Collection Time: 03/04/22  9:27 AM   Specimen: Nasopharyngeal Swab; Nasopharyngeal(NP) swabs in vial transport medium  Result Value Ref Range Status   SARS Coronavirus 2 by RT PCR NEGATIVE NEGATIVE Final    Comment: (NOTE) SARS-CoV-2 target nucleic acids are NOT DETECTED.  The SARS-CoV-2 RNA is generally detectable in upper respiratory specimens during the acute phase of infection. The lowest concentration of SARS-CoV-2 viral copies this assay can detect is 138 copies/mL. A negative result does not preclude SARS-Cov-2 infection and should not be used as the sole basis for treatment or other patient management decisions. A negative result may occur with  improper specimen collection/handling, submission of specimen other than nasopharyngeal swab, presence of viral mutation(s) within the areas targeted by this assay, and inadequate number of viral copies(<138 copies/mL). A negative result must be combined with clinical observations, patient history, and epidemiological information. The expected result is Negative.  Fact Sheet for Patients:  BloggerCourse.com  Fact Sheet for Healthcare Providers:   SeriousBroker.it  This test is no t yet approved or cleared by the [place] FDA and  has been authorized for detection and/or diagnosis of SARS-CoV-2 by FDA under an Emergency Use Authorization (EUA). This EUA will remain  in effect (meaning this test can be used) for the duration of the COVID-19 declaration under Section 564(b)(1) of the Act, 21 U.S.C.section 360bbb-3(b)(1), unless the authorization is terminated  or revoked sooner.       Influenza A by PCR NEGATIVE NEGATIVE Final   Influenza B by PCR NEGATIVE NEGATIVE Final    Comment: (NOTE) The Xpert Xpress SARS-CoV-2/FLU/RSV plus assay is intended as an aid in the diagnosis of influenza from Nasopharyngeal swab specimens and should not be used as a sole basis for treatment. Nasal washings and aspirates are unacceptable for Xpert Xpress SARS-CoV-2/FLU/RSV testing.  Fact Sheet for Patients: BloggerCourse.com  Fact Sheet for Healthcare Providers: SeriousBroker.it  This test is not yet approved or cleared by the [place] FDA and has been authorized for detection and/or diagnosis of SARS-CoV-2 by FDA under an Emergency Use Authorization (EUA). This EUA will remain in effect (meaning this test can be used) for the duration of the COVID-19 declaration under Section 564(b)(1) of the Act, 21 U.S.C. section 360bbb-3(b)(1), unless the authorization is terminated or revoked.  Performed at [facility], [address] [place], [place] [address]   Surgical pcr screen     Status: None   Collection Time: 03/08/22  8:11 PM   Specimen: Nasal Mucosa; Nasal Swab  Result Value Ref Range Status   MRSA, PCR NEGATIVE NEGATIVE Final   Staphylococcus aureus NEGATIVE NEGATIVE Final    Comment: (NOTE) The Xpert SA Assay (FDA approved for NASAL specimens in patients 51 years of age and older), is one component of a comprehensive surveillance  program. It is not intended to diagnose infection nor to guide or monitor treatment. Performed at [facility], [address] [place], [place] [address]     Procedures and diagnostic studies:  CT ABDOMEN PELVIS WO CONTRAST  Result Date: 03/08/2022 CLINICAL DATA:  Right lower quadrant pain. Diagnosis of renal stone March 04, 2022. EXAM: CT ABDOMEN AND PELVIS WITHOUT CONTRAST TECHNIQUE: Multidetector CT imaging  of the abdomen and pelvis was performed following the standard protocol without IV contrast. RADIATION DOSE REDUCTION: This exam was performed according to the departmental dose-optimization program which includes automated exposure control, adjustment of the mA and/or kV according to patient size and/or use of iterative reconstruction technique. COMPARISON:  March 04, 2022 CT scan FINDINGS: Lower chest: There is a small pericardial effusion measuring up to 10 mm today versus 5 mm previously. Small bilateral pleural effusions are larger in the interval as well. Hepatobiliary: Numerous stones are seen in the gallbladder. The liver is unremarkable. Pancreas: Unremarkable. No pancreatic ductal dilatation or surrounding inflammatory changes. Spleen: Normal in size without focal abnormality. Adrenals/Urinary Tract: Adrenal glands are normal. No renal stones are identified. Moderate right hydronephrosis and right ureterectasis identified due to a 5 mm stone located at the L4 level, stable. No other ureteral stones identified. A cyst is seal is identified. The bladder is otherwise normal. Stomach/Bowel: There is a small hiatal hernia. The stomach and small bowel are normal. Colonic diverticulosis is identified without diverticulitis. The appendix is not visualized. Vascular/Lymphatic: Calcified atherosclerosis is seen in the nonaneurysmal aorta. No adenopathy. Reproductive: Status post hysterectomy. No adnexal masses. Other: Ascites is seen in the pelvis, new in the interval. Increased  attenuation in the mesenteric fat is a new finding. Bilateral perinephric stranding is stable. Musculoskeletal: No acute or significant osseous findings. IMPRESSION: 1. The 5 mm stone on the right at the L4 level is stable resulting in moderate right hydronephrosis and right ureterectasis, similar in the interval. 2. The small pericardial effusion and bilateral pleural effusions are little larger in the interval. 3. New mild ascites in the pelvis and mild edema in the mesenteric fat. 4. Cholelithiasis. 5. Colonic diverticulosis. 6. Calcified atherosclerosis in the nonaneurysmal aorta. Electronically Signed   By: Gerome Sam III M.D.   On: 03/08/2022 13:33   US RENAL  Result Date: 03/08/2022 CLINICAL DATA:  Renal failure. EXAM: RENAL / URINARY TRACT ULTRASOUND COMPLETE COMPARISON:  CT scan March 08, 2022 FINDINGS: Right Kidney: Renal measurements: 12.2 x 5.8 x 5.9 cm = volume: 222 mL. Mild hydronephrosis is less prominent compared to today's CT scan. The difference may be due to difference in technique. Left Kidney: Renal measurements: 11.1 x 7.0 x 6.1 cm = volume: 246 mL. Echogenicity within normal limits. No mass or hydronephrosis visualized. Bladder: Appears normal for degree of bladder distention. Other: None. IMPRESSION: 1. Mild hydronephrosis is identified on the right. The hydronephrosis on the right is less prominent compared to the CT scan from earlier today. The difference could be due to difference in technique. 2. The left kidney is unremarkable with no evidence of obstruction. 3. No other abnormalities. Electronically Signed   By: Gerome Sam III M.D.   On: 03/08/2022 15:51   DG Chest Portable 1 View  Result Date: 03/08/2022 CLINICAL DATA:  Abdominal pain.  By for infiltrate. EXAM: PORTABLE CHEST 1 VIEW COMPARISON:  December 18, 2019 FINDINGS: Stable cardiomegaly. The hila and mediastinum are unchanged and unremarkable. No pneumothorax. Thickening of the right fissure, new. No nodules or  masses. No focal infiltrates. IMPRESSION: Thickening of the right fissure could represent pleural thickening versus a small about of fluid in the fissure, new since December 18, 2019. No other abnormalities or changes are identified. Electronically Signed   By: Gerome Sam III M.D.   On: 03/08/2022 15:04   DG OR UROLOGY CYSTO IMAGE ([facility] ONLY)  Result Date: 03/08/2022 There is no interpretation for this  exam.  This order is for images obtained during a surgical procedure.  Please See "Surgeries" Tab for more information regarding the procedure.               LOS: 1 day   Jp Eastham  [facility]   Pager on [email or web address]. If 7PM-7AM, please contact night-coverage at [email or web address]     03/09/2022, 11:45 AM

## 2022-03-09 NOTE — TOC Initial Note (Signed)
Transition of Care (TOC) - Initial/Assessment Note  ? ? ?Patient Details  ?Name: Dana [name] ?MRN: [id number] ?Date of Birth: 1946-07-17 ? ?Transition of Care (TOC) CM/SW Contact:    ?Tanija Germani E Rekisha Welling, LCSW ?Phone Number: ?03/09/2022, 11:46 AM ? ?Clinical Narrative:                Completed high risk assessment with patient. ?Patient lives with spouse. Drives herself to appointments. PCP is Dr. Tressia Miners. Pharmacy is [facility].  ?No DME, HH, or SNF history.  ?Patient denies TOC needs at this time.  ? ? ?Expected Discharge Plan: Home/Self Care ?Barriers to Discharge: Continued Medical Work up ? ? ?Patient Goals and CMS Choice ?Patient states their goals for this hospitalization and ongoing recovery are:: home with husband ?CMS Medicare.gov Compare Post Acute Care list provided to:: Patient ?Choice offered to / list presented to : Patient ? ?Expected Discharge Plan and Services ?Expected Discharge Plan: Home/Self Care ?  ?  ?  ?Living arrangements for the past 2 months: [place] ?                ?  ?  ?  ?  ?  ?  ?  ?  ?  ?  ? ?Prior Living Arrangements/Services ?Living arrangements for the past 2 months: [place] ?Lives with:: Spouse ?Patient language and need for interpreter reviewed:: Yes ?Do you feel safe going back to the place where you live?: Yes      ?Need for Family Participation in Patient Care: Yes (Comment) ?Care giver support system in place?: Yes (comment) ?  ?Criminal Activity/Legal Involvement Pertinent to Current Situation/Hospitalization: No - Comment as needed ? ?Activities of Daily Living ?Home Assistive Devices/Equipment: None ?ADL Screening (condition at time of admission) ?Patient's cognitive ability adequate to safely complete daily activities?: Yes ?Is the patient deaf or have difficulty hearing?: No ?Does the patient have difficulty seeing, even when wearing glasses/contacts?: No ?Does the patient have difficulty concentrating, remembering, or making decisions?:  No ?Patient able to express need for assistance with ADLs?: Yes ?Does the patient have difficulty dressing or bathing?: No ?Independently performs ADLs?: Yes (appropriate for developmental age) ?Does the patient have difficulty walking or climbing stairs?: No ?Weakness of Legs: None ?Weakness of Arms/Hands: None ? ?Permission Sought/Granted ?Permission sought to share information with : Family Supports ?Permission granted to share information with : Yes, Verbal Permission Granted ?   ?   ?   ?   ? ?Emotional Assessment ?  ?  ?  ?Orientation: : Oriented to Self, Oriented to Place, Oriented to  Time, Oriented to Situation ?Alcohol / Substance Use: Not Applicable ?Psych Involvement: No (comment) ? ?Admission diagnosis:  AKI (acute kidney injury) ([place]) [N17.9] ?Acute renal failure, unspecified acute renal failure type ([place]) [N17.9] ?Patient Active Problem List  ? Diagnosis Date Noted  ? AKI (acute kidney injury) ([place]) 03/08/2022  ? Dyslipidemia 03/08/2022  ? Acute unilateral obstructive uropathy 03/08/2022  ? PVD (peripheral vascular disease) ([place]) 01/21/2021  ? Hypertension 04/12/2020  ? Multinodular goiter 04/12/2020  ? Normocytic anemia 04/12/2020  ? Vertebral artery stenosis 04/12/2020  ? Type 2 diabetes mellitus with diabetic nephropathy, with long-term current use of insulin ([place]) 01/25/2020  ? DM (diabetes mellitus), type 1 ([place])   ? ?PCP:  Gladstone Lighter, MD ?Pharmacy:   ?[id number] [address], [place] ?[address] ?[place] [address] ?Phone: [phone] Fax: [phone] ? ? ? ? ?Social  Determinants of Health (SDOH) Interventions ?  ? ?Readmission Risk Interventions ?Readmission Risk Prevention Plan 03/09/2022  ?Transportation Screening Complete  ?PCP or Specialist Appt within 3-5 Days Complete  ?[place] or Home Care Consult Complete  ?Social Work Consult for [place] Planning/Counseling Complete  ?Palliative Care Screening Not Applicable  ?Medication Review Press photographer)  Complete  ? ? ? ?

## 2022-03-09 NOTE — Plan of Care (Signed)
?  Problem: Clinical Measurements: ?Goal: Will remain free from infection ?Outcome: Not Progressing ?  ?Problem: Clinical Measurements: ?Goal: Diagnostic test results will improve ?Outcome: Not Progressing ?  ?Problem: Pain Managment: ?Goal: General experience of comfort will improve ?Outcome: Not Progressing ?  ?Problem: Elimination: ?Goal: Will not experience complications related to urinary retention ?Outcome: Not Progressing ?  ?

## 2022-03-10 DIAGNOSIS — E162 Hypoglycemia, unspecified: Secondary | ICD-10-CM | POA: Diagnosis not present

## 2022-03-10 DIAGNOSIS — N201 Calculus of ureter: Secondary | ICD-10-CM

## 2022-03-10 DIAGNOSIS — N179 Acute kidney failure, unspecified: Secondary | ICD-10-CM | POA: Diagnosis not present

## 2022-03-10 DIAGNOSIS — N390 Urinary tract infection, site not specified: Secondary | ICD-10-CM

## 2022-03-10 DIAGNOSIS — N139 Obstructive and reflux uropathy, unspecified: Secondary | ICD-10-CM | POA: Diagnosis not present

## 2022-03-10 LAB — GLUCOSE, CAPILLARY
Glucose-Capillary: 38 mg/dL — CL (ref 70–99)
Glucose-Capillary: 42 mg/dL — CL (ref 70–99)
Glucose-Capillary: 97 mg/dL (ref 70–99)
Glucose-Capillary: 102 mg/dL — ABNORMAL HIGH (ref 70–99)
Glucose-Capillary: 127 mg/dL — ABNORMAL HIGH (ref 70–99)
Glucose-Capillary: 94 mg/dL (ref 70–99)
Glucose-Capillary: 96 mg/dL (ref 70–99)
Glucose-Capillary: 104 mg/dL — ABNORMAL HIGH (ref 70–99)

## 2022-03-10 LAB — URINE CULTURE: Culture: NO GROWTH

## 2022-03-10 LAB — BASIC METABOLIC PANEL
Anion gap: 6 (ref 5–15)
BUN: 64 mg/dL — ABNORMAL HIGH (ref 8–23)
CO2: 19 mmol/L — ABNORMAL LOW (ref 22–32)
Calcium: 7.7 mg/dL — ABNORMAL LOW (ref 8.9–10.3)
Chloride: 113 mmol/L — ABNORMAL HIGH (ref 98–111)
Creatinine, Ser: 4.66 mg/dL — ABNORMAL HIGH (ref 0.44–1.00)
GFR, Estimated: 9 mL/min — ABNORMAL LOW (ref >60)
Glucose, Bld: 50 mg/dL — ABNORMAL LOW (ref 70–99)
Potassium: 3.4 mmol/L — ABNORMAL LOW (ref 3.5–5.1)
Sodium: 138 mmol/L (ref 135–145)

## 2022-03-10 LAB — CBC WITH DIFFERENTIAL/PLATELET
Abs Immature Granulocytes: 0.05 10*3/uL (ref 0.00–0.07)
Basophils Absolute: 0 10*3/uL (ref 0.0–0.1)
Basophils Relative: 0 %
Eosinophils Absolute: 0.2 10*3/uL (ref 0.0–0.5)
Eosinophils Relative: 2 %
HCT: 25.9 % — ABNORMAL LOW (ref 36.0–46.0)
Hemoglobin: 8.3 g/dL — ABNORMAL LOW (ref 12.0–15.0)
Immature Granulocytes: 1 %
Lymphocytes Relative: 20 %
Lymphs Abs: 1.7 10*3/uL (ref 0.7–4.0)
MCH: 28.9 pg (ref 26.0–34.0)
MCHC: 32 g/dL (ref 30.0–36.0)
MCV: 90.2 fL (ref 80.0–100.0)
Monocytes Absolute: 0.9 10*3/uL (ref 0.1–1.0)
Monocytes Relative: 10 %
Neutro Abs: 5.8 10*3/uL (ref 1.7–7.7)
Neutrophils Relative %: 67 %
Platelets: 329 10*3/uL (ref 150–400)
RBC: 2.87 MIL/uL — ABNORMAL LOW (ref 3.87–5.11)
RDW: 13.2 % (ref 11.5–15.5)
WBC: 8.6 10*3/uL (ref 4.0–10.5)
nRBC: 0 % (ref 0.0–0.2)

## 2022-03-10 MED ORDER — DEXTROSE 50 % IV SOLN
INTRAVENOUS | Status: AC
  Filled 2022-03-10: qty 50

## 2022-03-10 MED ORDER — CEPHALEXIN 250 MG PO CAPS
250.0000 mg | ORAL_CAPSULE | ORAL | Status: AC
  Administered 2022-03-10 – 2022-03-14 (×5): 250 mg via ORAL
  Filled 2022-03-10 (×5): qty 1

## 2022-03-10 MED ORDER — INSULIN DETEMIR 100 UNIT/ML ~~LOC~~ SOLN
30.0000 [IU] | Freq: Every day | SUBCUTANEOUS | Status: DC
  Administered 2022-03-10: 30 [IU] via SUBCUTANEOUS
  Filled 2022-03-10: qty 0.3

## 2022-03-10 MED ORDER — GLUCAGON HCL RDNA (DIAGNOSTIC) 1 MG IJ SOLR
1.0000 mg | Freq: Once | INTRAMUSCULAR | Status: AC | PRN
  Administered 2022-03-10: 1 mg via INTRAVENOUS
  Filled 2022-03-10: qty 1

## 2022-03-10 NOTE — Progress Notes (Signed)
? ?      CROSS COVER NOTE ? ?NAME: Dana [name] ?MRN: [id number] ?DOB : 02-Nov-1946 ? ?Patient hypoglycemic overnight, CBG 38 and no IV access at this time. ? ?Plan: ? ?- Glucagon + Juice/Soda given ?- Decrease Levemir from 60U daily to 30U daily ?- AC/HS POCT blood glucose monitoring ?- Replace IV ?

## 2022-03-10 NOTE — Progress Notes (Signed)
Inpatient Diabetes Program Recommendations ? ?AACE/ADA: New Consensus Statement on Inpatient Glycemic Control (2015) ? ?Target Ranges:  Prepandial:   less than 140 mg/dL ?     Peak postprandial:   less than 180 mg/dL (1-2 hours) ?     Critically ill patients:  140 - 180 mg/dL  ? ? Latest Reference Range & Units 03/08/22 23:18 03/09/22 03:56 03/10/22 03:56 03/10/22 04:13 03/10/22 05:00  ?Glucose-Capillary 70 - 99 mg/dL 77 140 (H) ? ?60 units Levemir @0926  on 03/19 38 (LL) 42 (LL) 97  ? ? ?Admit with:  ?AKI (acute kidney injury) secondary to right obstructive uropathy and possibly developing diabetic nephropathy with nephrotic syndrome given her ascites and pericardial effusion ? ?History: DM, CKD ? ?Home DM Meds: Levemir 60 units Daily ?      Ozempic (NOT taking) ? ?Current Orders: Levemir 30 units Daily ? ? ?Underwent Cystoscopy with right retrograde pyelogram/ Right ureteroscopy/ Right ureteral stent placement last PM ? ? ?MD- Note pt received 60 units Levemir around 9am on 03/19--Severe Hypoglycemia this AM ? ?Note Levemir dose reduced to 30 units daily for today ? ?No orders for Novolog SSI-- ? ?Please consider starting Novolog Sensitive Correction Scale/ SSI (0-9 units) TID AC + HS ? ? ? ? ?--Will follow patient during hospitalization-- ? ?Wyn Quaker RN, MSN, CDE ?Diabetes Coordinator ?Inpatient Glycemic Control Team ?Team Pager: [phone] (8a-5p) ? ? ?

## 2022-03-10 NOTE — Plan of Care (Signed)
  Problem: Elimination: Goal: Will not experience complications related to bowel motility Outcome: Not Progressing   Problem: Elimination: Goal: Will not experience complications related to urinary retention Outcome: Not Progressing   Problem: Clinical Measurements: Goal: Diagnostic test results will improve Outcome: Not Progressing   

## 2022-03-10 NOTE — Progress Notes (Signed)
?[facility]  ?PROGRESS NOTE  ? ?Subjective:  ? ?Patient seen resting in bed ?Alert and oriented ?Tolerating meals ?Remains on room air ? ?Creatinine 4.66 ?UOP 1.8L ?Foley catheter in place ? ?Objective:  ?Vital signs: ?Blood pressure (!) 161/64, pulse 88, temperature 97.8 ?F (36.6 ?C), temperature source Oral, resp. rate 20, height 5\' 3"  (1.6 m), weight 68 kg, SpO2 100 %. ? ?Intake/Output Summary (Last 24 hours) at 03/10/2022 1303 ?Last data filed at 03/10/2022 0604 ?Gross per 24 hour  ?Intake 340 ml  ?Output 1800 ml  ?Net -1460 ml  ? ? ?Filed Weights  ? 03/08/22 1137  ?Weight: 68 kg  ? ? ? ?Physical Exam: ?General:  No acute distress  ?Head:  Normocephalic, atraumatic. Moist oral mucosal membranes  ?Eyes:  Anicteric  ?Lungs:   Clear to auscultation, normal effort  ?Heart:  S1S2 no rubs  ?Abdomen:   Soft, nontender, bowel sounds present  ?Extremities:  No peripheral edema.  ?Neurologic:  Awake, alert, following commands  ?Skin:  No lesions  ?   ? ? ?Basic Metabolic Panel: ?Recent Labs  ?Lab 03/04/22 ?0928 03/08/22 ?1139 03/09/22 ?0409 03/10/22 ?0402  ?NA 139 129* 132* 138  ?K 4.2 3.9 3.8 3.4*  ?CL 113* 100 108 113*  ?CO2 20* 16* 16* 19*  ?GLUCOSE 171* 95 137* 50*  ?BUN 35* 74* 70* 64*  ?CREATININE 2.50* 5.81* 5.34* 4.66*  ?CALCIUM 8.6* 8.2* 7.6* 7.7*  ? ? ? ?CBC: ?Recent Labs  ?Lab 03/04/22 ?0928 03/08/22 ?1139 03/09/22 ?0409 03/10/22 ?0402  ?WBC 8.2 15.0* 9.9 8.6  ?NEUTROABS 6.2  --   --  5.8  ?HGB 9.6* 9.0* 7.2* 8.3*  ?HCT 31.2* 28.5* 22.1* 25.9*  ?MCV 91.8 89.6 87.7 90.2  ?PLT 273 305 278 329  ? ? ? ? ?Urinalysis: ?Recent Labs  ?  03/08/22 ?1849  ?COLORURINE STRAW*  ?LABSPEC 1.010  ?PHURINE 5.0  ?GLUCOSEU 50*  ?HGBUR MODERATE*  ?BILIRUBINUR NEGATIVE  ?KETONESUR 5*  ?PROTEINUR 100*  ?NITRITE NEGATIVE  ?LEUKOCYTESUR NEGATIVE  ? ?  ? ? ?Imaging: ?CT ABDOMEN PELVIS WO CONTRAST ? ?Result Date: 03/08/2022 ?CLINICAL DATA:  Right lower quadrant pain. Diagnosis of renal stone March 04, 2022. EXAM: CT ABDOMEN  AND PELVIS WITHOUT CONTRAST TECHNIQUE: Multidetector CT imaging of the abdomen and pelvis was performed following the standard protocol without IV contrast. RADIATION DOSE REDUCTION: This exam was performed according to the departmental dose-optimization program which includes automated exposure control, adjustment of the mA and/or kV according to patient size and/or use of iterative reconstruction technique. COMPARISON:  March 04, 2022 CT scan FINDINGS: Lower chest: There is a small pericardial effusion measuring up to 10 mm today versus 5 mm previously. Small bilateral pleural effusions are larger in the interval as well. Hepatobiliary: Numerous stones are seen in the gallbladder. The liver is unremarkable. Pancreas: Unremarkable. No pancreatic ductal dilatation or surrounding inflammatory changes. Spleen: Normal in size without focal abnormality. Adrenals/Urinary Tract: Adrenal glands are normal. No renal stones are identified. Moderate right hydronephrosis and right ureterectasis identified due to a 5 mm stone located at the L4 level, stable. No other ureteral stones identified. A cyst is seal is identified. The bladder is otherwise normal. Stomach/Bowel: There is a small hiatal hernia. The stomach and small bowel are normal. Colonic diverticulosis is identified without diverticulitis. The appendix is not visualized. Vascular/Lymphatic: Calcified atherosclerosis is seen in the nonaneurysmal aorta. No adenopathy. Reproductive: Status post hysterectomy. No adnexal masses. Other: Ascites is seen in the pelvis, new in  the interval. Increased attenuation in the mesenteric fat is a new finding. Bilateral perinephric stranding is stable. Musculoskeletal: No acute or significant osseous findings. IMPRESSION: 1. The 5 mm stone on the right at the L4 level is stable resulting in moderate right hydronephrosis and right ureterectasis, similar in the interval. 2. The small pericardial effusion and bilateral pleural effusions  are little larger in the interval. 3. New mild ascites in the pelvis and mild edema in the mesenteric fat. 4. Cholelithiasis. 5. Colonic diverticulosis. 6. Calcified atherosclerosis in the nonaneurysmal aorta. Electronically Signed   By: Dorise Bullion III M.D.   On: 03/08/2022 13:33  ? ?US RENAL ? ?Result Date: 03/08/2022 ?CLINICAL DATA:  Renal failure. EXAM: RENAL / URINARY TRACT ULTRASOUND COMPLETE COMPARISON:  CT scan March 08, 2022 FINDINGS: Right Kidney: Renal measurements: 12.2 x 5.8 x 5.9 cm = volume: 222 mL. Mild hydronephrosis is less prominent compared to today's CT scan. The difference may be due to difference in technique. Left Kidney: Renal measurements: 11.1 x 7.0 x 6.1 cm = volume: 246 mL. Echogenicity within normal limits. No mass or hydronephrosis visualized. Bladder: Appears normal for degree of bladder distention. Other: None. IMPRESSION: 1. Mild hydronephrosis is identified on the right. The hydronephrosis on the right is less prominent compared to the CT scan from earlier today. The difference could be due to difference in technique. 2. The left kidney is unremarkable with no evidence of obstruction. 3. No other abnormalities. Electronically Signed   By: Dorise Bullion III M.D.   On: 03/08/2022 15:51  ? ?DG Chest Portable 1 View ? ?Result Date: 03/08/2022 ?CLINICAL DATA:  Abdominal pain.  By for infiltrate. EXAM: PORTABLE CHEST 1 VIEW COMPARISON:  December 18, 2019 FINDINGS: Stable cardiomegaly. The hila and mediastinum are unchanged and unremarkable. No pneumothorax. Thickening of the right fissure, new. No nodules or masses. No focal infiltrates. IMPRESSION: Thickening of the right fissure could represent pleural thickening versus a small about of fluid in the fissure, new since December 18, 2019. No other abnormalities or changes are identified. Electronically Signed   By: Dorise Bullion III M.D.   On: 03/08/2022 15:04  ? ?DG OR UROLOGY CYSTO IMAGE ([place]) ? ?Result Date:  03/08/2022 ?There is no interpretation for this exam.  This order is for images obtained during a surgical procedure.  Please See "Surgeries" Tab for more information regarding the procedure.   ? ? ?Medications:  ? ? sodium chloride 100 mL/hr at 03/10/22 0017  ? ? amLODipine  10 mg Oral Daily  ? cephALEXin  250 mg Oral Q24H  ? Chlorhexidine Gluconate Cloth  6 each Topical Daily  ? heparin injection (subcutaneous)  5,000 Units Subcutaneous Q8H  ? insulin detemir  30 Units Subcutaneous Daily  ? omega-3 acid ethyl esters  1 g Oral Daily  ? tamsulosin  0.4 mg Oral Daily  ? ? ?Assessment/ Plan:  ?   ?Principal Problem: ?  AKI (acute kidney injury) ([place]) ?Active Problems: ?  DM (diabetes mellitus), type 1 ([place]) ?  Hypertension ?  Dyslipidemia ?  Acute unilateral obstructive uropathy ?  Hypoglycemia ?  Acute UTI ?  Right ureteral calculus ? ?76 y.o. female with a PMHx of hypertension, coronary artery disease, diabetes, peripheral vascular disease, chronic kidney disease stage IIIb now comes back to the emergency room after discharge 3 days ago.  She was initially presented to the emergency room with flank pain associated with nausea and vomiting.  She was found to have right-sided  nephrolithiasis with moderate hydronephrosis.  ?  ?#1: Acute kidney injury on chronic kidney disease: Patient has CKD stage IV with baseline creatinine 1.83 and GFR 28. . The acute kidney injury is most likely secondary to severe prerenal azotemia complicated by the use of ACE inhibitor's and diuretics.   ?Creatinine improving with adequate urine output recorded. Continue normal saline at 100 cc an hour.  ?  ?#2: Acidosis: The metabolic acidosis possibly can be due to acute kidney injury complicated by sepsis. ? Slowly improving ?  ?#3: Nephrolithiasis/hydronephrosis/sepsis: Patient is now s/p urological procedure and placement of right ureteral stent.  ? Foley catheter remains in place with clear yellow urine. Continue Tamsulosin.   ? ?#4:  Hypertension: Patient has been on ACE inhibitor's and diuretics which should be placed on hold for now.  We can continue the amlodipine and can start on clonidine or hydralazine if needed. ?  ?#5: Diabetes: Hypoglycemic t

## 2022-03-10 NOTE — Progress Notes (Addendum)
? ? ? ?Progress Note  ? ? ?[name]  BZJ:[id number] DOB: [birth year]  DOA: 03/08/2022 ?PCP: Gladstone Lighter, MD  ? ? ? ? ?Brief Narrative:  ? ? ?Medical records reviewed and are as summarized below: ? ?Dana [name] is a 76 y.o. female with medical history significant for type 1 diabetes mellitus, hypertension, CKD stage III, who presented to the emergency room on 314 with right lower quadrant abdominal pain, nausea and vomiting.  She was found to have a 5 mm right proximal ureteral calculus with mild hydronephrosis/hydroureter.  She was treated with analgesics and was discharged home from the ED on hydrocodone, Flomax and Zofran.  She presented again to the hospital on 03/08/2022 with nausea, vomiting and worsening right lower quadrant pain radiating to the right flank.  Repeat CT abdomen and pelvis showed persistent 5 mm right proximal ureteral stone with moderate right hydronephrosis.  She was also found to have AKI, likely postobstructive uropathy. ? ?She was treated with IV fluids and empiric IV antibiotics.  She underwent right ureteroscopy and right ureteral stent placement.  10 mL of purulent urine was aspirated from the renal pelvis during the procedure. ? ? ? ? ? ? ? ?Assessment/Plan:  ? ?Principal Problem: ?  AKI (acute kidney injury) ([place]) ?Active Problems: ?  Acute unilateral obstructive uropathy ?  DM (diabetes mellitus), type 1 ([place]) ?  Hypertension ?  Dyslipidemia ?  Hypoglycemia ?  Acute UTI ?  Right ureteral calculus ? ? ? ?Body mass index is 26.56 kg/m?. ? ? ? ?AKI on CKD stage III, metabolic acidosis, obstructive uropathy: Creatinine is slowly improving.  Continue IV fluids and monitor BMP.  Urine protein creatinine ratio on 02/05/2022 was 8,170 concerning for nephrotic syndrome.  Follow-up with nephrologist for further recommendations. ? ?5 mm right proximal ureteral stone, moderate right hydronephrosis: S/p right ureteral stent placement on 03/08/2022 ? ?Acute complicated UTI/pyonephrosis:  Urine culture did not show any growth.  Plan discussed with Dr. Bernardo Heater, urologist.  He recommended changing IV to oral antibiotic to complete 7 days of treatment.  Discontinue IV Rocephin and start Keflex. ? ?Type I DM, recent hypoglycemia: Levemir has been decreased from 60 to 30 units daily.  Monitor glucose closely and adjust insulin as needed.   ? ?Hypokalemia: Recommended oral potassium repletion.  However, patient prefers to eat bananas to replace her potassium. ? ?Hyponatremia: Improved ? ?Anemia of chronic disease: Monitor H&H.  Recent iron studies was unremarkable. ? ?Incidental finding of small pericardial effusion and small bilateral pleural effusion:  She is hemodynamically stable ? ?Other comorbidities include hypertension, hyperlipidemia ? ?Diet Order   ? ?       ?  Diet heart healthy/carb modified Room service appropriate? Yes; Fluid consistency: Thin  Diet effective now       ?  ? ?  ?  ? ?  ? ? ? ? ? ?Consultants: ?Urologist ?Nephrologist ? ?Procedures: ?Right ureteroscopy with right ureteral stent placement on 03/08/2022 ? ? ? ?Medications:  ? ? amLODipine  10 mg Oral Daily  ? cephALEXin  250 mg Oral Q24H  ? Chlorhexidine Gluconate Cloth  6 each Topical Daily  ? heparin injection (subcutaneous)  5,000 Units Subcutaneous Q8H  ? insulin detemir  30 Units Subcutaneous Daily  ? omega-3 acid ethyl esters  1 g Oral Daily  ? tamsulosin  0.4 mg Oral Daily  ? ?Continuous Infusions: ? sodium chloride 100 mL/hr at 03/10/22 0017  ? ? ? ?Anti-infectives (From admission, onward)  ? ?  Start     Dose/Rate Route Frequency Ordered Stop  ? 03/10/22 2000  cephALEXin (KEFLEX) capsule 250 mg       ? 250 mg Oral Every 24 hours 03/10/22 1129 03/15/22 1959  ? 03/09/22 2000  cefTRIAXone (ROCEPHIN) 1 g in sodium chloride 0.9 % 100 mL IVPB  Status:  Discontinued       ? 1 g ?200 mL/hr over 30 Minutes Intravenous Every 24 hours 03/09/22 0006 03/10/22 1129  ? 03/08/22 1845  cefTRIAXone (ROCEPHIN) 1 g in sodium chloride 0.9 %  100 mL IVPB       ? 1 g ?200 mL/hr over 30 Minutes Intravenous  Once 03/08/22 1754 03/08/22 2135  ? ?  ? ? ? ? ? ? ? ? ? ?Family Communication/Anticipated D/C date and plan/Code Status  ? ?DVT prophylaxis: heparin injection 5,000 Units Start: 03/08/22 1430 ? ? ?  Code Status: Full Code ? ?Family Communication: None ?Disposition Plan: Plan to discharge home tomorrow ? ? ?Status is: Inpatient ?Remains inpatient appropriate because: On IV fluids, monitor kidney function ? ? ? ? ? ? ?Subjective:  ? ?Interval events noted.  Blood sugar dropped overnight into the 30s.  She feels fine otherwise.  No abdominal pain, nausea, vomiting or any urinary symptoms. ? ?Objective:  ? ? ?Vitals:  ? 03/09/22 1710 03/09/22 1930 03/10/22 0409 03/10/22 0754  ?BP: (!) 125/58 131/60 (!) 158/71 (!) 161/64  ?Pulse: 90 91 86 88  ?Resp:  20 20   ?Temp: 99.3 ?F (37.4 ?C) 98.4 ?F (36.9 ?C) 97.7 ?F (36.5 ?C) 97.8 ?F (36.6 ?C)  ?TempSrc:  Oral Oral Oral  ?SpO2: 97% 99% 100% 100%  ?Weight:      ?Height:      ? ?No data found. ? ? ?Intake/Output Summary (Last 24 hours) at 03/10/2022 1204 ?Last data filed at 03/10/2022 0604 ?Gross per 24 hour  ?Intake 340 ml  ?Output 1800 ml  ?Net -1460 ml  ? ?Filed Weights  ? 03/08/22 1137  ?Weight: 68 kg  ? ? ?Exam: ? ?GEN: NAD ?SKIN: No rash ?EYES: EOMI ?ENT: MMM ?CV: RRR ?PULM: CTA B ?ABD: soft, obese , NT, +BS ?CNS: AAO x 3, non focal ?EXT: No edema or tenderness ?GU: Foley catheter with pinkish urine ? ? ? ?  ? ? ?Data Reviewed:  ? ?I have personally reviewed following labs and imaging studies: ? ?Labs: ?Labs show the following:  ? ?Basic Metabolic Panel: ?Recent Labs  ?Lab 03/04/22 ?0928 03/08/22 ?1139 03/09/22 ?0409 03/10/22 ?0402  ?NA 139 129* 132* 138  ?K 4.2 3.9 3.8 3.4*  ?CL 113* 100 108 113*  ?CO2 20* 16* 16* 19*  ?GLUCOSE 171* 95 137* 50*  ?BUN 35* 74* 70* 64*  ?CREATININE 2.50* 5.81* 5.34* 4.66*  ?CALCIUM 8.6* 8.2* 7.6* 7.7*  ? ?GFR ?Estimated Creatinine Clearance: 9.5 mL/min (A) (by C-G formula based on  SCr of 4.66 mg/dL (H)). ?Liver Function Tests: ?Recent Labs  ?Lab 03/04/22 ?0928 03/08/22 ?1139  ?AST 34 20  ?ALT 20 14  ?ALKPHOS 88 85  ?BILITOT 0.6 0.7  ?PROT 7.2 7.0  ?ALBUMIN 3.0* 2.7*  ? ?Recent Labs  ?Lab 03/04/22 ?0928 03/08/22 ?1139  ?LIPASE 23 22  ? ?No results for input(s): AMMONIA in the last 168 hours. ?Coagulation profile ?No results for input(s): INR, PROTIME in the last 168 hours. ? ?CBC: ?Recent Labs  ?Lab 03/04/22 ?0928 03/08/22 ?1139 03/09/22 ?0409 03/10/22 ?0402  ?WBC 8.2 15.0* 9.9 8.6  ?NEUTROABS 6.2  --   --  5.8  ?HGB 9.6* 9.0* 7.2* 8.3*  ?HCT 31.2* 28.5* 22.1* 25.9*  ?MCV 91.8 89.6 87.7 90.2  ?PLT 273 305 278 329  ? ?Cardiac Enzymes: ?No results for input(s): CKTOTAL, CKMB, CKMBINDEX, TROPONINI in the last 168 hours. ?BNP (last 3 results) ?No results for input(s): PROBNP in the last 8760 hours. ?CBG: ?Recent Labs  ?Lab 03/09/22 ?0356 03/10/22 ?0356 03/10/22 ?0413 03/10/22 ?0500 03/10/22 ?4076  ?GLUCAP 140* 38* 42* 97 102*  ? ?D-Dimer: ?No results for input(s): DDIMER in the last 72 hours. ?Hgb A1c: ?No results for input(s): HGBA1C in the last 72 hours. ?Lipid Profile: ?No results for input(s): CHOL, HDL, LDLCALC, TRIG, CHOLHDL, LDLDIRECT in the last 72 hours. ?Thyroid function studies: ?No results for input(s): TSH, T4TOTAL, T3FREE, THYROIDAB in the last 72 hours. ? ?Invalid input(s): FREET3 ?Anemia work up: ?No results for input(s): VITAMINB12, FOLATE, FERRITIN, TIBC, IRON, RETICCTPCT in the last 72 hours. ?Sepsis Labs: ?Recent Labs  ?Lab 03/04/22 ?0928 03/08/22 ?1139 03/09/22 ?0409 03/10/22 ?0402  ?WBC 8.2 15.0* 9.9 8.6  ?LATICACIDVEN 1.0  --   --   --   ? ? ?Microbiology ?Recent Results (from the past 240 hour(s))  ?Resp Panel by RT-PCR (Flu A&B, Covid) Nasopharyngeal Swab     Status: None  ? Collection Time: 03/04/22  9:27 AM  ? Specimen: Nasopharyngeal Swab; Nasopharyngeal(NP) swabs in vial transport medium  ?Result Value Ref Range Status  ? SARS Coronavirus 2 by RT PCR NEGATIVE NEGATIVE  Final  ?  Comment: (NOTE) ?SARS-CoV-2 target nucleic acids are NOT DETECTED. ? ?The SARS-CoV-2 RNA is generally detectable in upper respiratory ?specimens during the acute phase of infection. The lowest

## 2022-03-11 ENCOUNTER — Inpatient Hospital Stay: Payer: No Typology Code available for payment source

## 2022-03-11 DIAGNOSIS — N139 Obstructive and reflux uropathy, unspecified: Secondary | ICD-10-CM | POA: Diagnosis not present

## 2022-03-11 DIAGNOSIS — N390 Urinary tract infection, site not specified: Secondary | ICD-10-CM | POA: Diagnosis not present

## 2022-03-11 DIAGNOSIS — N179 Acute kidney failure, unspecified: Secondary | ICD-10-CM | POA: Diagnosis not present

## 2022-03-11 LAB — CBC WITH DIFFERENTIAL/PLATELET
Abs Immature Granulocytes: 0.05 10*3/uL (ref 0.00–0.07)
Basophils Absolute: 0 10*3/uL (ref 0.0–0.1)
Basophils Relative: 0 %
Eosinophils Absolute: 0.3 10*3/uL (ref 0.0–0.5)
Eosinophils Relative: 4 %
HCT: 22.6 % — ABNORMAL LOW (ref 36.0–46.0)
Hemoglobin: 7.2 g/dL — ABNORMAL LOW (ref 12.0–15.0)
Immature Granulocytes: 1 %
Lymphocytes Relative: 20 %
Lymphs Abs: 1.4 10*3/uL (ref 0.7–4.0)
MCH: 28.8 pg (ref 26.0–34.0)
MCHC: 31.9 g/dL (ref 30.0–36.0)
MCV: 90.4 fL (ref 80.0–100.0)
Monocytes Absolute: 0.8 10*3/uL (ref 0.1–1.0)
Monocytes Relative: 11 %
Neutro Abs: 4.6 10*3/uL (ref 1.7–7.7)
Neutrophils Relative %: 64 %
Platelets: 330 10*3/uL (ref 150–400)
RBC: 2.5 MIL/uL — ABNORMAL LOW (ref 3.87–5.11)
RDW: 13.4 % (ref 11.5–15.5)
WBC: 7.1 10*3/uL (ref 4.0–10.5)
nRBC: 0 % (ref 0.0–0.2)

## 2022-03-11 LAB — BASIC METABOLIC PANEL
Anion gap: 7 (ref 5–15)
BUN: 59 mg/dL — ABNORMAL HIGH (ref 8–23)
CO2: 16 mmol/L — ABNORMAL LOW (ref 22–32)
Calcium: 7.6 mg/dL — ABNORMAL LOW (ref 8.9–10.3)
Chloride: 114 mmol/L — ABNORMAL HIGH (ref 98–111)
Creatinine, Ser: 3.72 mg/dL — ABNORMAL HIGH (ref 0.44–1.00)
GFR, Estimated: 12 mL/min — ABNORMAL LOW (ref >60)
Glucose, Bld: 109 mg/dL — ABNORMAL HIGH (ref 70–99)
Potassium: 4 mmol/L (ref 3.5–5.1)
Sodium: 137 mmol/L (ref 135–145)

## 2022-03-11 LAB — GLUCOSE, CAPILLARY
Glucose-Capillary: 60 mg/dL — ABNORMAL LOW (ref 70–99)
Glucose-Capillary: 87 mg/dL (ref 70–99)
Glucose-Capillary: 114 mg/dL — ABNORMAL HIGH (ref 70–99)
Glucose-Capillary: 93 mg/dL (ref 70–99)
Glucose-Capillary: 122 mg/dL — ABNORMAL HIGH (ref 70–99)
Glucose-Capillary: 110 mg/dL — ABNORMAL HIGH (ref 70–99)

## 2022-03-11 LAB — HEMOGLOBIN A1C
Hgb A1c MFr Bld: 7.1 % — ABNORMAL HIGH (ref 4.8–5.6)
Mean Plasma Glucose: 157 mg/dL

## 2022-03-11 MED ORDER — INSULIN DETEMIR 100 UNIT/ML ~~LOC~~ SOLN
10.0000 [IU] | Freq: Every day | SUBCUTANEOUS | Status: DC
  Administered 2022-03-11 – 2022-03-15 (×5): 10 [IU] via SUBCUTANEOUS
  Filled 2022-03-11 (×5): qty 0.1

## 2022-03-11 MED ORDER — INSULIN DETEMIR 100 UNIT/ML ~~LOC~~ SOLN
15.0000 [IU] | Freq: Every day | SUBCUTANEOUS | Status: DC
  Filled 2022-03-11: qty 0.15

## 2022-03-11 MED ORDER — SODIUM BICARBONATE 650 MG PO TABS
650.0000 mg | ORAL_TABLET | Freq: Two times a day (BID) | ORAL | Status: DC
Start: 2022-03-11 — End: 2022-03-15
  Administered 2022-03-11 – 2022-03-15 (×8): 650 mg via ORAL
  Filled 2022-03-11 (×8): qty 1

## 2022-03-11 MED ORDER — IRBESARTAN 150 MG PO TABS
75.0000 mg | ORAL_TABLET | Freq: Every day | ORAL | Status: DC
  Administered 2022-03-11 – 2022-03-14 (×4): 75 mg via ORAL
  Filled 2022-03-11 (×4): qty 1

## 2022-03-11 NOTE — Progress Notes (Signed)
Patient continues to verbalize discomfort in left leg. Dr Mal Misty aware ?

## 2022-03-11 NOTE — Progress Notes (Signed)
? ?      CROSS COVER NOTE ? ?NAME: Dana [name] ?MRN: [id number] ?DOB : 1946-04-04 ? ?Patient hypoglycemic overnight, CBG 60  ?  ?Plan: ?  ?- 4 oz juice/Soda given ?- Decrease Levemir from 30U daily to 10U daily ?- HS snack added to Diet ?- AC/HS +3AM POCT blood glucose monitoring ?- HGB A1C ordered ? ? ?Neomia Glass MHA, MSN, FNP-BC ?Nurse Practitioner ?[facility] ?[place] ?Pager [phone] ? ?

## 2022-03-11 NOTE — Care Management Important Message (Signed)
Important Message ? ?Patient Details  ?Name: Dana [name] ?MRN: [id number] ?Date of Birth: 1946-12-01 ? ? ?Medicare Important Message Given:  N/A - LOS <3 / Initial given by admissions ? ? ? ? ?[name] ?03/11/2022, 8:20 AM ?

## 2022-03-11 NOTE — Progress Notes (Signed)
Order received from Dr Mal Misty to discontinue foley, decrease NS to 68ml/hr, and PT ?

## 2022-03-11 NOTE — Progress Notes (Addendum)
?[facility]  ?PROGRESS NOTE  ? ?Subjective:  ? ?Patient seen resting in bed ?Alert and oriented ?Tolerating meals without nausea and vomiting ? ?Foley catheter with clear yellow urine ?Creatinine 3.2 with recorded urine output of 1.8 L in 24 hours. ? ?Objective:  ?Vital signs: ?Blood pressure (!) 161/69, pulse 86, temperature 98.8 ?F (37.1 ?C), resp. rate 18, height 5\' 3"  (1.6 m), weight 68 kg, SpO2 100 %. ? ?Intake/Output Summary (Last 24 hours) at 03/11/2022 1040 ?Last data filed at 03/11/2022 0600 ?Gross per 24 hour  ?Intake 400 ml  ?Output 1800 ml  ?Net -1400 ml  ? ? ?Filed Weights  ? 03/08/22 1137  ?Weight: 68 kg  ? ? ? ?Physical Exam: ?General:  No acute distress  ?Head:  Normocephalic, atraumatic. Moist oral mucosal membranes  ?Eyes:  Anicteric  ?Lungs:   Clear to auscultation, normal effort  ?Heart:  S1S2 no rubs  ?Abdomen:   Soft, nontender, bowel sounds present  ?Extremities:  No peripheral edema.  ?Neurologic:  Awake, alert, following commands  ?Skin:  No lesions  ?GU Foley catheter-clear yellow urine  ? ? ?Basic Metabolic Panel: ?Recent Labs  ?Lab 03/08/22 ?1139 03/09/22 ?0409 03/10/22 ?0402 03/11/22 ?3500  ?NA 129* 132* 138 137  ?K 3.9 3.8 3.4* 4.0  ?CL 100 108 113* 114*  ?CO2 16* 16* 19* 16*  ?GLUCOSE 95 137* 50* 109*  ?BUN 74* 70* 64* 59*  ?CREATININE 5.81* 5.34* 4.66* 3.72*  ?CALCIUM 8.2* 7.6* 7.7* 7.6*  ? ? ? ?CBC: ?Recent Labs  ?Lab 03/08/22 ?1139 03/09/22 ?0409 03/10/22 ?0402 03/11/22 ?9381  ?WBC 15.0* 9.9 8.6 7.1  ?NEUTROABS  --   --  5.8 4.6  ?HGB 9.0* 7.2* 8.3* 7.2*  ?HCT 28.5* 22.1* 25.9* 22.6*  ?MCV 89.6 87.7 90.2 90.4  ?PLT 305 278 329 330  ? ? ? ? ?Urinalysis: ?Recent Labs  ?  03/08/22 ?1849  ?COLORURINE STRAW*  ?LABSPEC 1.010  ?PHURINE 5.0  ?GLUCOSEU 50*  ?HGBUR MODERATE*  ?BILIRUBINUR NEGATIVE  ?KETONESUR 5*  ?PROTEINUR 100*  ?NITRITE NEGATIVE  ?LEUKOCYTESUR NEGATIVE  ? ?  ? ? ?Imaging: ?No results found. ? ? ?Medications:  ? ? sodium chloride 75 mL/hr at 03/11/22 0945  ? ?  amLODipine  10 mg Oral Daily  ? cephALEXin  250 mg Oral Q24H  ? heparin injection (subcutaneous)  5,000 Units Subcutaneous Q8H  ? insulin detemir  10 Units Subcutaneous Daily  ? omega-3 acid ethyl esters  1 g Oral Daily  ? tamsulosin  0.4 mg Oral Daily  ? ? ?Assessment/ Plan:  ?   ?Principal Problem: ?  AKI (acute kidney injury) ([place]) ?Active Problems: ?  DM (diabetes mellitus), type 1 ([place]) ?  Hypertension ?  Dyslipidemia ?  Acute unilateral obstructive uropathy ?  Hypoglycemia ?  Acute UTI ?  Right ureteral calculus ? ?76 y.o. female with a PMHx of hypertension, coronary artery disease, diabetes, peripheral vascular disease, chronic kidney disease now comes back to the emergency room after discharge 3 days ago.  She was initially presented to the emergency room with flank pain associated with nausea and vomiting.  She was found to have right-sided nephrolithiasis with moderate hydronephrosis.  ?  ?#1: Acute kidney injury on chronic kidney disease st 4: Patient has CKD stage IV with baseline creatinine 1.83 and GFR 28. . The acute kidney injury is most likely secondary to severe prerenal azotemia complicated by the use of ACE inhibitor's and diuretics.   ?Renal function continues to improve  with adequate urine output.  IV fluids reduced to normal saline at 75 mL/h.  Patient will need follow-up in our office at discharge. ?  ?#2:  Acute metabolic acidosis: The metabolic acidosis possibly can be due to acute kidney injury complicated by sepsis. ? Start oral sodium bicarb supplementation ?  ?#3: Nephrolithiasis/hydronephrosis/sepsis: Patient is now s/p urological procedure and placement of right ureteral stent.  ? Foley catheter with clear yellow urine. Continue Tamsulosin.   ? ?#4: Hypertension:   ? Blood pressure remains slightly elevated at 161/69. ? Currently on amlodipine ? Restart ARB (irbesartan) ?  ?#5: Diabetes: Hypoglycemic to 60 this mooring. Corrected with oral nutrition.  Further correction made to long  acting insulin by primary team ?  ?#6: Hyponatremia:   ? Resolved ?  ?#7: Anemia: Anemia most likely secondary to chronic kidney disease. ? Hgb below target at 7.2. Will continue to monitor ? Start oral iron supplements- patient allergic to red food color - will need to clarify ? ? LOS: 3 ?Colon Flattery, NP ?[facility] ?3/21/202310:40 AM ? ?Patient was examined and evaluated with Colon Flattery, NP.  Plan of care was formulated and discussed with patient as well as NP.  I agree with the note as documented with edits ?  ?

## 2022-03-11 NOTE — Progress Notes (Signed)
PT Cancellation Note ? ?Patient Details ?Name: Dana [name] ?MRN: [id number] ?DOB: [birth year] ? ? ?Cancelled Treatment:    Reason Eval/Treat Not Completed: Pain limiting ability to participate.  PT consult received.  Chart reviewed.  Pt resting in bed upon PT arrival.  Pt reporting receiving pain meds about 20 minutes ago and had not taken effect yet and requesting therapist return later.  Unfortunately, this therapist unable to return later (discussed with pt).  Pt then requesting therapy to return tomorrow morning instead.  Nursing notified of attempted session. ? ?Leitha Bleak, PT ?03/11/22, 5:00 PM ? ?

## 2022-03-11 NOTE — Progress Notes (Signed)
? ? ? ?Progress Note  ? ? ?Dana [name]  AVW:[id number] DOB: 1946-02-23  DOA: 03/08/2022 ?PCP: Gladstone Lighter, MD  ? ? ? ? ?Brief Narrative:  ? ? ?Medical records reviewed and are as summarized below: ? ?Dana [name] is a 76 y.o. female with medical history significant for type 1 diabetes mellitus, hypertension, CKD stage III, who presented to the emergency room on 314 with right lower quadrant abdominal pain, nausea and vomiting.  She was found to have a 5 mm right proximal ureteral calculus with mild hydronephrosis/hydroureter.  She was treated with analgesics and was discharged home from the ED on hydrocodone, Flomax and Zofran.  She presented again to the hospital on 03/08/2022 with nausea, vomiting and worsening right lower quadrant pain radiating to the right flank.  Repeat CT abdomen and pelvis showed persistent 5 mm right proximal ureteral stone with moderate right hydronephrosis.  She was also found to have AKI, likely postobstructive uropathy. ? ?She was treated with IV fluids and empiric IV antibiotics.  She underwent right ureteroscopy and right ureteral stent placement.  10 mL of purulent urine was aspirated from the renal pelvis during the procedure. ? ? ? ? ? ? ? ?Assessment/Plan:  ? ?Principal Problem: ?  AKI (acute kidney injury) ([place]) ?Active Problems: ?  Acute unilateral obstructive uropathy ?  DM (diabetes mellitus), type 1 ([place]) ?  Hypertension ?  Dyslipidemia ?  Hypoglycemia ?  Acute UTI ?  Right ureteral calculus ? ? ? ?Body mass index is 26.56 kg/m?. ? ? ? ?AKI on CKD stage III, metabolic acidosis, obstructive uropathy: Creatinine slowly improving.  Decrease IV fluids from 100 mL/h to 75 mL/h.  Monitor BMP.  Urine protein creatinine ratio on 02/05/2022 was 8,170 concerning for nephrotic syndrome.  Follow-up with nephrologist for further recommendations. ? ?5 mm right proximal ureteral stone, moderate right hydronephrosis: S/p right ureteral stent placement on 03/08/2022.  Outpatient  follow-up with urologist. ? ?Acute complicated UTI/pyonephrosis: Urine culture did not show any growth.  Continue Keflex through 03/14/2022. ? ?Left lower extremity pain: Venous duplex of the lower extremity has been ordered to rule out DVT ? ?Type I DM, recurrent hypoglycemia: This is likely related to AKI.  Glucose dropped to 60 overnight even after long Levemir was reduced from 60 to 30 units daily.  Metformin has been reduced again from 30 to 10 units daily.  Monitor glucose levels closely and adjust insulin therapy as needed. ? ?Anemia of chronic disease: Monitor H&H.  Recent iron studies was unremarkable. ? ?Incidental finding of small pericardial effusion and small bilateral pleural effusion:  She is hemodynamically stable ? ?Hypokalemia and hyponatremia: Improved ? ?Other comorbidities include hypertension, hyperlipidemia ? ?Diet Order   ? ?       ?  Diet Carb Modified Fluid consistency: Thin; Room service appropriate? Yes  Diet effective now       ?  ? ?  ?  ? ?  ? ? ? ? ? ?Consultants: ?Urologist ?Nephrologist ? ?Procedures: ?Right ureteroscopy with right ureteral stent placement on 03/08/2022 ? ? ? ?Medications:  ? ? amLODipine  10 mg Oral Daily  ? cephALEXin  250 mg Oral Q24H  ? heparin injection (subcutaneous)  5,000 Units Subcutaneous Q8H  ? insulin detemir  10 Units Subcutaneous Daily  ? omega-3 acid ethyl esters  1 g Oral Daily  ? tamsulosin  0.4 mg Oral Daily  ? ?Continuous Infusions: ? sodium chloride 75 mL/hr at 03/11/22 0945  ? ? ? ?Anti-infectives (  From admission, onward)  ? ? Start     Dose/Rate Route Frequency Ordered Stop  ? 03/10/22 2000  cephALEXin (KEFLEX) capsule 250 mg       ? 250 mg Oral Every 24 hours 03/10/22 1129 03/15/22 1959  ? 03/09/22 2000  cefTRIAXone (ROCEPHIN) 1 g in sodium chloride 0.9 % 100 mL IVPB  Status:  Discontinued       ? 1 g ?200 mL/hr over 30 Minutes Intravenous Every 24 hours 03/09/22 0006 03/10/22 1129  ? 03/08/22 1845  cefTRIAXone (ROCEPHIN) 1 g in sodium chloride  0.9 % 100 mL IVPB       ? 1 g ?200 mL/hr over 30 Minutes Intravenous  Once 03/08/22 1754 03/08/22 2135  ? ?  ? ? ? ? ? ? ? ? ? ?Family Communication/Anticipated D/C date and plan/Code Status  ? ?DVT prophylaxis: heparin injection 5,000 Units Start: 03/08/22 1430 ? ? ?  Code Status: Full Code ? ?Family Communication: None ?Disposition Plan: Plan to discharge home tomorrow ? ? ?Status is: Inpatient ?Remains inpatient appropriate because: On IV fluids, monitor kidney function ? ? ? ? ? ? ?Subjective:  ? ?Interval events noted.  She complains of pain in the lower extremity (extending from the thigh to the foot) that started yesterday.  She said she felt weak in the leg because of the pain and she had to be assisted by 2 nurses this morning.  She said she has no history of arthritis or neuropathy in the lower extremities.  Chart review shows history of PVD but patient denies this and said she has no history of PVD. ? ?Objective:  ? ? ?Vitals:  ? 03/10/22 1534 03/10/22 2027 03/11/22 0321 03/11/22 0834  ?BP: (!) 141/64 (!) 145/66 (!) 146/62 (!) 161/69  ?Pulse: 92 89 92 86  ?Resp: 16 16 16 18   ?Temp: 99 ?F (37.2 ?C) 98.5 ?F (36.9 ?C) 99.1 ?F (37.3 ?C) 98.8 ?F (37.1 ?C)  ?TempSrc: Oral  Oral   ?SpO2: 99% 97% 99% 100%  ?Weight:      ?Height:      ? ?No data found. ? ? ?Intake/Output Summary (Last 24 hours) at 03/11/2022 1141 ?Last data filed at 03/11/2022 0600 ?Gross per 24 hour  ?Intake 400 ml  ?Output 1800 ml  ?Net -1400 ml  ? ?Filed Weights  ? 03/08/22 1137  ?Weight: 68 kg  ? ? ?Exam: ? ?GEN: NAD ?SKIN: No rash ?EYES: EOMI ?ENT: MMM ?CV: RRR ?PULM: CTA B ?ABD: soft, ND, NT, +BS ?CNS: AAO x 3, non focal ?EXT: Mild tenderness of the left thigh and left leg.  No erythema or swelling noted. ?GU: Foley catheter draining amber urine ? ? ? ?  ? ? ?Data Reviewed:  ? ?I have personally reviewed following labs and imaging studies: ? ?Labs: ?Labs show the following:  ? ?Basic Metabolic Panel: ?Recent Labs  ?Lab 03/08/22 ?1139  03/09/22 ?0409 03/10/22 ?0402 03/11/22 ?1610  ?NA 129* 132* 138 137  ?K 3.9 3.8 3.4* 4.0  ?CL 100 108 113* 114*  ?CO2 16* 16* 19* 16*  ?GLUCOSE 95 137* 50* 109*  ?BUN 74* 70* 64* 59*  ?CREATININE 5.81* 5.34* 4.66* 3.72*  ?CALCIUM 8.2* 7.6* 7.7* 7.6*  ? ?GFR ?Estimated Creatinine Clearance: 11.9 mL/min (A) (by C-G formula based on SCr of 3.72 mg/dL (H)). ?Liver Function Tests: ?Recent Labs  ?Lab 03/08/22 ?1139  ?AST 20  ?ALT 14  ?ALKPHOS 85  ?BILITOT 0.7  ?PROT 7.0  ?ALBUMIN 2.7*  ? ?  Recent Labs  ?Lab 03/08/22 ?1139  ?LIPASE 22  ? ?No results for input(s): AMMONIA in the last 168 hours. ?Coagulation profile ?No results for input(s): INR, PROTIME in the last 168 hours. ? ?CBC: ?Recent Labs  ?Lab 03/08/22 ?1139 03/09/22 ?0409 03/10/22 ?0402 03/11/22 ?1443  ?WBC 15.0* 9.9 8.6 7.1  ?NEUTROABS  --   --  5.8 4.6  ?HGB 9.0* 7.2* 8.3* 7.2*  ?HCT 28.5* 22.1* 25.9* 22.6*  ?MCV 89.6 87.7 90.2 90.4  ?PLT 305 278 329 330  ? ?Cardiac Enzymes: ?No results for input(s): CKTOTAL, CKMB, CKMBINDEX, TROPONINI in the last 168 hours. ?BNP (last 3 results) ?No results for input(s): PROBNP in the last 8760 hours. ?CBG: ?Recent Labs  ?Lab 03/10/22 ?1849 03/10/22 ?2044 03/11/22 ?0238 03/11/22 ?1540 03/11/22 ?0836  ?GLUCAP 96 104* 60* 87 114*  ? ?D-Dimer: ?No results for input(s): DDIMER in the last 72 hours. ?Hgb A1c: ?No results for input(s): HGBA1C in the last 72 hours. ?Lipid Profile: ?No results for input(s): CHOL, HDL, LDLCALC, TRIG, CHOLHDL, LDLDIRECT in the last 72 hours. ?Thyroid function studies: ?No results for input(s): TSH, T4TOTAL, T3FREE, THYROIDAB in the last 72 hours. ? ?Invalid input(s): FREET3 ?Anemia work up: ?No results for input(s): VITAMINB12, FOLATE, FERRITIN, TIBC, IRON, RETICCTPCT in the last 72 hours. ?Sepsis Labs: ?Recent Labs  ?Lab 03/08/22 ?1139 03/09/22 ?0409 03/10/22 ?0402 03/11/22 ?0867  ?WBC 15.0* 9.9 8.6 7.1  ? ? ?Microbiology ?Recent Results (from the past 240 hour(s))  ?Resp Panel by RT-PCR (Flu A&B, Covid)  Nasopharyngeal Swab     Status: None  ? Collection Time: 03/04/22  9:27 AM  ? Specimen: Nasopharyngeal Swab; Nasopharyngeal(NP) swabs in vial transport medium  ?Result Value Ref Range Status  ? SARS Coronavirus 2 b

## 2022-03-12 ENCOUNTER — Inpatient Hospital Stay: Payer: No Typology Code available for payment source

## 2022-03-12 DIAGNOSIS — E872 Acidosis, unspecified: Secondary | ICD-10-CM | POA: Diagnosis present

## 2022-03-12 DIAGNOSIS — M79605 Pain in left leg: Secondary | ICD-10-CM | POA: Diagnosis not present

## 2022-03-12 DIAGNOSIS — N179 Acute kidney failure, unspecified: Secondary | ICD-10-CM | POA: Diagnosis not present

## 2022-03-12 LAB — CBC WITH DIFFERENTIAL/PLATELET
Abs Immature Granulocytes: 0.06 10*3/uL (ref 0.00–0.07)
Basophils Absolute: 0 10*3/uL (ref 0.0–0.1)
Basophils Relative: 0 %
Eosinophils Absolute: 0.3 10*3/uL (ref 0.0–0.5)
Eosinophils Relative: 4 %
HCT: 23.5 % — ABNORMAL LOW (ref 36.0–46.0)
Hemoglobin: 7.5 g/dL — ABNORMAL LOW (ref 12.0–15.0)
Immature Granulocytes: 1 %
Lymphocytes Relative: 23 %
Lymphs Abs: 1.6 10*3/uL (ref 0.7–4.0)
MCH: 28.2 pg (ref 26.0–34.0)
MCHC: 31.9 g/dL (ref 30.0–36.0)
MCV: 88.3 fL (ref 80.0–100.0)
Monocytes Absolute: 0.8 10*3/uL (ref 0.1–1.0)
Monocytes Relative: 12 %
Neutro Abs: 4.2 10*3/uL (ref 1.7–7.7)
Neutrophils Relative %: 60 %
Platelets: 371 10*3/uL (ref 150–400)
RBC: 2.66 MIL/uL — ABNORMAL LOW (ref 3.87–5.11)
RDW: 13.4 % (ref 11.5–15.5)
WBC: 7 10*3/uL (ref 4.0–10.5)
nRBC: 0 % (ref 0.0–0.2)

## 2022-03-12 LAB — BASIC METABOLIC PANEL
Anion gap: 6 (ref 5–15)
BUN: 54 mg/dL — ABNORMAL HIGH (ref 8–23)
CO2: 17 mmol/L — ABNORMAL LOW (ref 22–32)
Calcium: 8 mg/dL — ABNORMAL LOW (ref 8.9–10.3)
Chloride: 114 mmol/L — ABNORMAL HIGH (ref 98–111)
Creatinine, Ser: 3.36 mg/dL — ABNORMAL HIGH (ref 0.44–1.00)
GFR, Estimated: 14 mL/min — ABNORMAL LOW (ref >60)
Glucose, Bld: 100 mg/dL — ABNORMAL HIGH (ref 70–99)
Potassium: 4 mmol/L (ref 3.5–5.1)
Sodium: 137 mmol/L (ref 135–145)

## 2022-03-12 LAB — GLUCOSE, CAPILLARY
Glucose-Capillary: 104 mg/dL — ABNORMAL HIGH (ref 70–99)
Glucose-Capillary: 98 mg/dL (ref 70–99)
Glucose-Capillary: 126 mg/dL — ABNORMAL HIGH (ref 70–99)

## 2022-03-12 MED ORDER — GABAPENTIN 100 MG PO CAPS: 100.0000 mg | ORAL_CAPSULE | Freq: Two times a day (BID) | ORAL | Status: DC

## 2022-03-12 NOTE — Evaluation (Signed)
Physical Therapy Evaluation ?Patient Details ?Name: Dana [name] ?MRN: [id number] ?DOB: [birth year] ?Today's Date: 03/12/2022 ? ?History of Present Illness ? Patient is a 76 year old female with a PMH (+) for  type 1 diabetes mellitus, hypertension, CKD stage III, who presented to the emergency room on 03/04/21 with right lower quadrant abdominal pain, nausea and vomiting. Patient admitted for AKI. ?  ?Clinical Impression ? Physical Therapy Evaluation completed on this date. Patient tolerated session fairly well and was agreeable to treatment. Patient reports no pain at rest, however 9/10 pain in LLE with functional movement. Patient states she was Independent at baseline with all ADLs and mobility, and lives in an entry level room at a motel with her husband. Per patient report husband can only assist minimally due to his own medical limitations.  ? ?Patient demonstrated decreased weakness in BLEs (L>R) during evaluation. Due to pain LLE reported at 2/5- patient was able to minimally ab/adduct leg in bed, however unable to lift off the bed. RLE was at least 3/5. Patient required increased time and effort to complete supine<>sitting bed mobility. Supine to sit with HOB elevated required Mod A through varying assist at trunk, LLE, and HHA. Patient was addiment on completing without increased assist, however NT called in for safety. Sit to stand from EOB was completed Mod A +2 from EOB, however patient was able to complete sit to stand from [facility] at supervision with increased time and effort. CGA/HHA was required for step pivot transfer <>BSC, and patient required Min A to complete sit to supine back into bed. Patient is very limited by pain, however very motivated to return home. Patient would continue to benefit from skilled physical therapy in order to optimize patient's return to PLOF. Recommend STR upon discharge from acute hospitalization.     ?   ? ?Recommendations for follow up therapy are one component of a  multi-disciplinary discharge planning process, led by the attending physician.  Recommendations may be updated based on patient status, additional functional criteria and insurance authorization. ? ?Follow Up Recommendations Skilled nursing-short term rehab (<3 hours/day) ? ?  ?Assistance Recommended at Discharge Frequent or constant Supervision/Assistance  ?Patient can return home with the following ? A little help with walking and/or transfers;Assist for transportation;A little help with bathing/dressing/bathroom ? ?  ?Equipment Recommendations None recommended by PT (defer to next level of care)  ?Recommendations for Other Services ?    ?  ?Functional Status Assessment Patient has had a recent decline in their functional status and demonstrates the ability to make significant improvements in function in a reasonable and predictable amount of time.  ? ?  ?Precautions / Restrictions Precautions ?Precautions: Fall ?Restrictions ?Weight Bearing Restrictions: No  ? ?  ? ?Mobility ? Bed Mobility ?Overal bed mobility: Needs Assistance ?Bed Mobility: Supine to Sit, Sit to Supine ?  ?  ?Supine to sit: Mod assist, HOB elevated (Significant increased time, effort, and use of bed rails to complete) ?Sit to supine: Min assist, HOB elevated (increased time and effort, use of bed rails to complete) ?  ?General bed mobility comments: patient able to demonstrate at supervision ability to pull herself up/ reposition in bed, assistance given at LLE and trunk ?Patient Response: Flat affect, Cooperative ? ?Transfers ?Overall transfer level: Needs assistance ?Equipment used: Rolling walker (2 wheels) ?Transfers: Sit to/from Stand, Bed to chair/wheelchair/BSC ?Sit to Stand: Mod assist, +2 physical assistance ?  ?Step pivot transfers: Min assist ?  ?  ?  ?General transfer comment:  HHA/CGA for safety~8 steps total (4 steps<>bsc) ?  ? ?Ambulation/Gait ?Ambulation/Gait assistance:  (deferred due to pain and safety) ?  ?  ?  ?  ?  ?  ?   ? ?Stairs ?  ?  ?  ?  ?  ? ?Wheelchair Mobility ?  ? ?Modified Rankin (Stroke Patients Only) ?  ? ?  ? ?Balance Overall balance assessment: Needs assistance ?Sitting-balance support: Bilateral upper extremity supported, Feet supported ?Sitting balance-Leahy Scale: Fair ?Sitting balance - Comments: able to demonstrate pericare at supervision with increased time ?  ?Standing balance support: Bilateral upper extremity supported, During functional activity, Reliant on assistive device for balance ?Standing balance-Leahy Scale: Poor ?  ?  ?  ?  ?  ?  ?  ?  ?  ?  ?  ?  ?   ? ? ? ?Pertinent Vitals/Pain Pain Assessment ?Pain Assessment: 0-10 ?Pain Score: 9  ?Pain Location: at rest 0/10 pain, 9/10 pain with functional movement in LLE ?Pain Descriptors / Indicators: Aching, Discomfort, Dull, Grimacing ?Pain Intervention(s): Limited activity within patient's tolerance, Monitored during session, Repositioned, Premedicated before session  ? ? ?Home Living Family/patient expects to be discharged to:: Private residence ?Living Arrangements: Spouse/significant other ?Available Help at Discharge: Family;Available 24 hours/day (can help minimally) ?Type of Home:  (motel) ?Home Access: Level entry ?  ?  ?  ?Home Layout: One level ?Home Equipment: Grab bars - tub/shower;Grab bars - toilet ?   ?  ?Prior Function Prior Level of Function : Independent/Modified Independent ?  ?  ?  ?  ?  ?  ?  ?  ?  ? ? ?Hand Dominance  ? Dominant Hand: Right ? ?  ?Extremity/Trunk Assessment  ? Upper Extremity Assessment ?Upper Extremity Assessment: Generalized weakness ?  ? ?Lower Extremity Assessment ?Lower Extremity Assessment: Generalized weakness;RLE deficits/detail;LLE deficits/detail ?RLE Deficits / Details: 2/5 due to pain ?LLE Deficits / Details: at least 3/5 pain ?  ? ?   ?Communication  ? Communication: No difficulties  ?Cognition Arousal/Alertness: Awake/alert ?Behavior During Therapy: [facility] for tasks assessed/performed ?Overall Cognitive  Status: Within Functional Limits for tasks assessed ?  ?  ?  ?  ?  ?  ?  ?  ?  ?  ?  ?  ?  ?  ?  ?  ?General Comments: A&Ox3-self, location, sitaution ?  ?  ? ?  ?General Comments   ? ?  ?Exercises Other Exercises ?Other Exercises: patient educated on role of PT in acute setting, fall risk, d/c plans  ? ?Assessment/Plan  ?  ?PT Assessment Patient needs continued PT services  ?PT Problem List Decreased strength;Decreased mobility;Decreased range of motion;Decreased balance;Decreased activity tolerance;Pain;Decreased knowledge of use of DME ? ?   ?  ?PT Treatment Interventions DME instruction;Therapeutic activities;Gait training;Therapeutic exercise;Patient/family education;Functional mobility training;Balance training   ? ?PT Goals (Current goals can be found in the Care Plan section)  ?Acute Rehab PT Goals ?Patient Stated Goal: to go home ?PT Goal Formulation: With patient ?Time For Goal Achievement: 03/26/22 ?Potential to Achieve Goals: Fair ? ?  ?Frequency Min 2X/week ?  ? ? ?Co-evaluation   ?  ?  ?  ?  ? ? ?  ?AM-PAC PT "6 Clicks" Mobility  ?Outcome Measure Help needed turning from your back to your side while in a flat bed without using bedrails?: A Lot ?Help needed moving from lying on your back to sitting on the side of a flat bed without using bedrails?: A Lot ?Help  needed moving to and from a bed to a chair (including a wheelchair)?: A Little ?Help needed standing up from a chair using your arms (e.g., wheelchair or bedside chair)?: A Lot ?Help needed to walk in hospital room?: A Lot ?Help needed climbing 3-5 steps with a railing? : A Lot ?6 Click Score: 13 ? ?  ?End of Session Equipment Utilized During Treatment: Gait belt ?Activity Tolerance: Patient limited by pain ?Patient left: in bed;with bed alarm set;with nursing/sitter in room (MD present) ?Nurse Communication: Mobility status ?PT Visit Diagnosis: Unsteadiness on feet (R26.81);Muscle weakness (generalized) (M62.81);Difficulty in walking, not  elsewhere classified (R26.2);Pain ?Pain - Right/Left: Left ?Pain - part of body: Leg ?  ? ?Time: 7322-0254 ?PT Time Calculation (min) (ACUTE ONLY): 41 min ? ? ?Charges:   PT Evaluation ?$PT Eval Moderate Complexity: 1 Mod ?

## 2022-03-12 NOTE — Assessment & Plan Note (Addendum)
Mildly elevated anion gap 13 on admission in the setting of acute renal failure.  Gap now closed, with hyperchloremia having been on normal saline since admission.   ?Renal function improving. ?-- Nephrology following ?-- Started on bicarb tablets ?-- Monitor BMP ?-- Off IV fluids and stable ?

## 2022-03-12 NOTE — Progress Notes (Addendum)
?Progress Note ? ? ?Patient: Dana [name] BDZ:[id number] DOB: [birth year] DOA: 03/08/2022     4 ?DOS: the patient was seen and examined on 03/12/2022 ?  ?Brief hospital course: ?Yicel Gethers is a 76 y.o. female with medical history significant for type 1 diabetes mellitus, hypertension, CKD stage IV, who presented to the ED on 3/14 with right lower quadrant abdominal pain, nausea and vomiting.  She was found to have a 5 mm right proximal ureteral calculus with mild hydronephrosis/hydroureter.  She was treated with analgesics and was discharged home from the ED on hydrocodone, Flomax and Zofran.  She presented again on 03/08/2022 with ongoing nausea, vomiting and worsening right lower quadrant pain radiating to the right flank.  Repeat CT abdomen and pelvis showed persistent 5 mm right proximal ureteral stone with moderate right hydronephrosis.  She was also found to have AKI, likely postobstructive uropathy. ?  ?She was treated with IV fluids and empiric IV antibiotics. Urology was consulted, patient underwent right ureteroscopy and right ureteral stent placement.  10 mL of purulent urine was aspirated from the renal pelvis during the procedure. ? ?3/22: Cr slowly improving, remains on IV fluids.  Also on bicarb tablets for metabolic acidosis with nephrology following.  ? ?Assessment and Plan: ?* AKI (acute kidney injury) ([place]) ?Presented with creatinine of 5.81.  Has baseline CKD stage IV, baseline creatinine about 1.8.  AKI felt likely to prerenal azotemia with continuation of ACE inhibitor and diuretic in addition to obstructive uropathy ?-- Nephrology following ?-- Continue IV fluids ?--Daily BMP to monitor ?-- Avoid nephrotoxins, hypotension ?-- Renally dose meds as indicated ?-- Monitor urine output ? ?Acute unilateral obstructive uropathy ?Status post right ureteral stent placement with urology on 3/18. ? ? ? ?DM (diabetes mellitus), type 1 ([place]) ?With renal insufficiency, patient has had recurrent  hypoglycemic episodes. ?Levemir reduced 60>>30>>10 units daily. ?Monitor CBGs and adjust insulin as needed for goal 140-180 ? ? ?Hypertension ?Continue amlodipine.  ARB resumed. ?BPs have been mildly elevated ? ?Metabolic acidosis ?Mildly elevated anion gap 13 on admission in the setting of acute renal failure.  Gap now closed, with hyperchloremia having been on normal saline since admission.   ?Renal function improving. ?-- Nephrology following ?-- Started on bicarb tablets ?-- Monitor BMP ? ?Left leg pain ?Patient has an onset of severe left lower extremity of pain from the hip to the ankle/toes.  Doppler ultrasound negative for DVT.  No apparent trauma or aggravating factor per history patient gives. ?Sounds consistent with lumbar radiculopathy. ?-- MRI lumbar spine ?-- With renal insufficiency, unable to try gabapentin and patient declines due to side effects ?-- Continue Tylenol and/or Norco as needed ? ?Right ureteral calculus ?Status post ureteral stent placement by urology on 3/18.  Management as outlined per urology.  Foley removed.  Continue Flomax. ? ?Acute UTI ?Urine culture with no growth. ?On Keflex through 3/24. ? ?Hypoglycemia ?Likely due to renal insufficiency and use of insulin.  Levemir dose has been reduced. ?-- Hypoglycemia protocol, CBGs ? ?Dyslipidemia ?Continue statin ? ? ? ? ?  ? ?Subjective: Patient awake resting in bed when seen this morning.  She reports still having ongoing severe left leg pain from her hip to her toes.  She says the Norco helps significantly.  Just finished working with PT and says it did not go well. ? ?Physical Exam: ?Vitals:  ? 03/11/22 1635 03/11/22 1922 03/12/22 0515 03/12/22 0730  ?BP: (!) 162/66 (!) 155/79 (!) 147/63 (!) 166/74  ?Pulse: 89 86  86 87  ?Resp: 16 20 17 18   ?Temp: 98.4 ?F (36.9 ?C) 98.4 ?F (36.9 ?C) 99.2 ?F (37.3 ?C) 98.3 ?F (36.8 ?C)  ?TempSrc:  Oral Oral Oral  ?SpO2: 100% 99% 97% 98%  ?Weight:      ?Height:      ? ?General exam: awake, alert, no  acute distress ?HEENT: atraumatic, clear conjunctiva, anicteric sclera, moist mucus membranes, hearing grossly normal  ?Respiratory system: CTAB, no wheezes, rales or rhonchi, normal respiratory effort. ?Cardiovascular system: normal S1/S2, RRR, no pedal edema.   ?Gastrointestinal system: soft, nontender abdomen ?Central nervous system: A&O x3. no gross focal neurologic deficits, normal speech ?Extremities: moves all, no edema, normal tone, lower extremities equal in diameter ?Skin: dry, intact, normal temperature ?Psychiatry: normal mood, congruent affect, judgement and insight appear normal ? ? ? ?Data Reviewed: ? ?Labs reviewed and notable for chloride 114, CO2 17, glucose 100, creatinine 3.36, GFR 14, hemoglobin 7.5 ? ?Family Communication: None at bedside during encounter ? ?Disposition: ?Status is: Inpatient ?Remains inpatient appropriate because: Severity of illness with persistent AKI remaining on IV fluids.  Evaluation of acute onset left leg pain underway. ? ? Planned Discharge Destination: Skilled nursing facility ? ? ? ?Time spent: 35 minutes ? ?Author: ?Ezekiel Slocumb, DO ?03/12/2022 12:32 PM ? ?For on call review [email or web address].  ?

## 2022-03-12 NOTE — Assessment & Plan Note (Addendum)
Resolved. ?Likely due to renal insufficiency and use of insulin.  Levemir dose has been reduced as outlined. ? ?

## 2022-03-12 NOTE — Assessment & Plan Note (Addendum)
Patient has an onset of severe left lower extremity of pain from the hip to the ankle/toes after admission.  She can typically walk for extended periods of time, usually 30 minutes or more daily.  Mobility now severely limited due to pain. ?Doppler ultrasound negative for DVT.  No apparent trauma or aggravating factor per history patient gives. ? ?Patient's description of symptoms consistent with lumbar radiculopathy, but MRI lumbar spine negative any left-sided pathology (showed mild multilevel degenerative changes with mild right foraminal stenosis at L5-S1 and mild right subarticular recess stenosis at L4-L5.  No other significant canal or foraminal stenosis) ? ?-- Orthopedic surgery for further input 3/23 ? ?MRI Left Hip obtained which shows: ?--Diffuse intramuscular edema involving the hip and pelvic musculature suggestive of polymyositis or potentially rhabdomyolysis. Correlate with creatine kinase. ?--Significant edema and fluid surrounding the left sciatic nerve potentially resulting in sciatic nerve irritation. ?--Nonspecific subcutaneous edema noted along the lower anterior abdominal wall and along the hips. ? ?-- With renal insufficiency, unable to try gabapentin and patient declines due to side effects ?-- Continue Tylenol and/or Norco as needed ?-- Continue PT/OT, mobilize as much as tolerated ?-- Expect gradual improvement over few weeks with supportive care ?

## 2022-03-12 NOTE — Assessment & Plan Note (Addendum)
Urine culture with no growth. ?Completed course with Keflex through 3/24. ?

## 2022-03-12 NOTE — TOC Progression Note (Signed)
Transition of Care (TOC) - Progression Note  ? ? ?Patient Details  ?Name: Dana [name] ?MRN: [id number] ?Date of Birth: 03/11/1946 ? ?Transition of Care (TOC) CM/SW Contact  ?Beverly Sessions, RN ?Phone Number: ?03/12/2022, 2:16 PM ? ?Clinical Narrative:    ?Therapy recommending SNF ?Patient adamantly declines SNF ?She states she is agreeable to home health services.  States she does not have a preference of agency.   ?- Awaiting response from [facility], and [place] ?- [facility] unable to accept ? ?Patient request BSC and RW for discharge  ? ? ?Expected Discharge Plan: Home/Self Care ?Barriers to Discharge: Continued Medical Work up ? ?Expected Discharge Plan and Services ?Expected Discharge Plan: Home/Self Care ?  ?  ?Post Acute Care Choice: Home Health ?Living arrangements for the past 2 months: [place] ?                ?DME Arranged: [place] rolling, 3-N-1 ?DME Agency: [employer] ?  ?  ?  ?  ?  ?  ?  ?  ? ? ?Social Determinants of Health (SDOH) Interventions ?  ? ?Readmission Risk Interventions ? ?  03/09/2022  ? 11:45 AM  ?Readmission Risk Prevention Plan  ?Transportation Screening Complete  ?PCP or Specialist Appt within 3-5 Days Complete  ?[place] or Home Care Consult Complete  ?Social Work Consult for [place] Planning/Counseling Complete  ?Palliative Care Screening Not Applicable  ?Medication Review Press photographer) Complete  ? ? ?

## 2022-03-12 NOTE — Assessment & Plan Note (Addendum)
Status post ureteral stent placement by urology on 3/18.  Management as outlined per urology.  Foley removed.  Continue Flomax. ?Follow-up with urology outpatient as instructed ?

## 2022-03-12 NOTE — Progress Notes (Signed)
?[facility]  ?PROGRESS NOTE  ? ?Subjective:  ? ?Patient seen sitting at side of bed, states she feels well today ?Foley catheter removed yesterday ?Tolerating meals ?Denies shortness of breath, remains on room air ? ?Creatinine continues to improve 3.36 ?700 mL urine output recorded on day shift yesterday. ? ?Objective:  ?Vital signs: ?Blood pressure (!) 166/74, pulse 87, temperature 98.3 ?F (36.8 ?C), temperature source Oral, resp. rate 18, height 5\' 3"  (1.6 m), weight 68 kg, SpO2 98 %. ? ?Intake/Output Summary (Last 24 hours) at 03/12/2022 1045 ?Last data filed at 03/11/2022 2249 ?Gross per 24 hour  ?Intake 3329.69 ml  ?Output 700 ml  ?Net 2629.69 ml  ? ? ?Filed Weights  ? 03/08/22 1137  ?Weight: 68 kg  ? ? ? ?Physical Exam: ?General:  No acute distress  ?Head:  Normocephalic, atraumatic. Moist oral mucosal membranes  ?Eyes:  Anicteric  ?Lungs:   Clear to auscultation, normal effort  ?Heart:  S1S2 no rubs  ?Abdomen:   Soft, nontender, bowel sounds present  ?Extremities:  No peripheral edema.  ?Neurologic:  Awake, alert, following commands  ?Skin:  No lesions  ?   ? ? ?Basic Metabolic Panel: ?Recent Labs  ?Lab 03/08/22 ?1139 03/09/22 ?0409 03/10/22 ?0402 03/11/22 ?3419 03/12/22 ?0407  ?NA 129* 132* 138 137 137  ?K 3.9 3.8 3.4* 4.0 4.0  ?CL 100 108 113* 114* 114*  ?CO2 16* 16* 19* 16* 17*  ?GLUCOSE 95 137* 50* 109* 100*  ?BUN 74* 70* 64* 59* 54*  ?CREATININE 5.81* 5.34* 4.66* 3.72* 3.36*  ?CALCIUM 8.2* 7.6* 7.7* 7.6* 8.0*  ? ? ? ?CBC: ?Recent Labs  ?Lab 03/08/22 ?1139 03/09/22 ?0409 03/10/22 ?0402 03/11/22 ?3790 03/12/22 ?0407  ?WBC 15.0* 9.9 8.6 7.1 7.0  ?NEUTROABS  --   --  5.8 4.6 4.2  ?HGB 9.0* 7.2* 8.3* 7.2* 7.5*  ?HCT 28.5* 22.1* 25.9* 22.6* 23.5*  ?MCV 89.6 87.7 90.2 90.4 88.3  ?PLT 305 278 329 330 371  ? ? ? ? ?Urinalysis: ?No results for input(s): COLORURINE, LABSPEC, [place], GLUCOSEU, HGBUR, BILIRUBINUR, KETONESUR, PROTEINUR, UROBILINOGEN, NITRITE, LEUKOCYTESUR in the last 72 hours. ? ?Invalid  input(s): APPERANCEUR ?  ? ? ?Imaging: ?US Venous Img Lower Unilateral Left (DVT) ? ?Result Date: 03/11/2022 ?CLINICAL DATA:  76 year old female with left lower extremity pain. EXAM: LEFT LOWER EXTREMITY VENOUS DOPPLER ULTRASOUND TECHNIQUE: Gray-scale sonography with graded compression, as well as color Doppler and duplex ultrasound were performed to evaluate the left lower extremity deep venous systems from the level of the common femoral vein and including the common femoral, femoral, profunda femoral, popliteal and calf veins including the posterior tibial, peroneal and gastrocnemius veins when visible. Spectral Doppler was utilized to evaluate flow at rest and with distal augmentation maneuvers in the common femoral, femoral and popliteal veins. The contralateral common femoral vein was also evaluated for comparison. COMPARISON:  None. FINDINGS: LEFT LOWER EXTREMITY Common Femoral Vein: No evidence of thrombus. Normal compressibility, respiratory phasicity and response to augmentation. Central Greater Saphenous Vein: No evidence of thrombus. Normal compressibility and flow on color Doppler imaging. Central Profunda Femoral Vein: No evidence of thrombus. Normal compressibility and flow on color Doppler imaging. Femoral Vein: No evidence of thrombus. Normal compressibility, respiratory phasicity and response to augmentation. Popliteal Vein: No evidence of thrombus. Normal compressibility, respiratory phasicity and response to augmentation. Calf Veins: No evidence of thrombus. Normal compressibility and flow on color Doppler imaging. Other Findings:  None. RIGHT LOWER EXTREMITY Common Femoral Vein: No evidence of thrombus.  Normal compressibility, respiratory phasicity and response to augmentation. IMPRESSION: No evidence of left lower extremity deep venous thrombosis. Ruthann Cancer, MD [facility] Electronically Signed   By: Ruthann Cancer M.D.   On:  03/11/2022 13:37   ? ? ?Medications:  ? ? sodium chloride 75 mL/hr at 03/11/22 2249  ? ? amLODipine  10 mg Oral Daily  ? cephALEXin  250 mg Oral Q24H  ? heparin injection (subcutaneous)  5,000 Units Subcutaneous Q8H  ? insulin detemir  10 Units Subcutaneous Daily  ? irbesartan  75 mg Oral Daily  ? omega-3 acid ethyl esters  1 g Oral Daily  ? sodium bicarbonate  650 mg Oral BID  ? tamsulosin  0.4 mg Oral Daily  ? ? ?Assessment/ Plan:  ?   ?Principal Problem: ?  AKI (acute kidney injury) ([place]) ?Active Problems: ?  DM (diabetes mellitus), type 1 ([place]) ?  Hypertension ?  Dyslipidemia ?  Acute unilateral obstructive uropathy ?  Hypoglycemia ?  Acute UTI ?  Right ureteral calculus ? ?76 y.o. female with a PMHx of hypertension, coronary artery disease, diabetes, peripheral vascular disease, chronic kidney disease now comes back to the emergency room after discharge 3 days ago.  She was initially presented to the emergency room with flank pain associated with nausea and vomiting.  She was found to have right-sided nephrolithiasis with moderate hydronephrosis.  ?  ?#1: Acute kidney injury on chronic kidney disease st 4: Patient has CKD stage IV with baseline creatinine 1.83 and GFR 28. . The acute kidney injury is most likely secondary to severe prerenal azotemia complicated by the use of ACE inhibitor's and diuretics.   ?Creatinine continues to improve daily.  We will continue to monitor urine output and blood work.  IV fluids remain ?  ?#2:  Acute metabolic acidosis: The metabolic acidosis possibly can be due to acute kidney injury complicated by sepsis. ? Sodium bicarb tablets started yesterday, will monitor improvement ?  ?#3: Nephrolithiasis/hydronephrosis/sepsis: Patient is now s/p urological procedure and placement of right ureteral stent.  ? Catheter discontinued yesterday.  Patient voiding well.  Continue Tamsulosin.   ? ?#4: Hypertension:   ? Blood pressure remains 166/74 ? Currently on amlodipine and irbesartan  restarted yesterday. ?  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  Glucose stable overnight.  Primary team to manage SSI. ?  ?#6: Hyponatremia:   ? Stable ?  ?#7: Anemia: Anemia most likely secondary to chronic kidney disease. ? Hgb below target at 7.5. Will continue to monitor ? Recommend oral iron supplements- patient allergic to red food color - will need to clarify ? ? LOS: 4 ?Colon Flattery, NP ?[facility] ?3/22/202310:45 AM ? ?

## 2022-03-12 NOTE — Hospital Course (Addendum)
Dana [name] is a 76 y.o. female with medical history significant for type 1 diabetes mellitus, hypertension, CKD stage IV, who presented to the ED on 3/14 with right lower quadrant abdominal pain, nausea and vomiting.  She was found to have a 5 mm right proximal ureteral calculus with mild hydronephrosis/hydroureter.  She was treated with analgesics and was discharged home from the ED on hydrocodone, Flomax and Zofran.  She presented again on 03/08/2022 with ongoing nausea, vomiting and worsening right lower quadrant pain radiating to the right flank.  Repeat CT abdomen and pelvis showed persistent 5 mm right proximal ureteral stone with moderate right hydronephrosis.  She was also found to have AKI, likely postobstructive uropathy. ?  ?She was treated with IV fluids and empiric IV antibiotics. Urology was consulted, patient underwent right ureteroscopy and right ureteral stent placement.  10 mL of purulent urine was aspirated from the renal pelvis during the procedure. ? ?Patient developed acute onset of left hip and lower extremity pain from her hip to ankle that was not present on admission.  Extensive evaluation as outlined below. ?

## 2022-03-13 ENCOUNTER — Inpatient Hospital Stay: Payer: No Typology Code available for payment source

## 2022-03-13 DIAGNOSIS — N179 Acute kidney failure, unspecified: Secondary | ICD-10-CM | POA: Diagnosis not present

## 2022-03-13 DIAGNOSIS — R531 Weakness: Secondary | ICD-10-CM

## 2022-03-13 LAB — BASIC METABOLIC PANEL
Anion gap: 9 (ref 5–15)
BUN: 45 mg/dL — ABNORMAL HIGH (ref 8–23)
CO2: 16 mmol/L — ABNORMAL LOW (ref 22–32)
Calcium: 8.2 mg/dL — ABNORMAL LOW (ref 8.9–10.3)
Chloride: 113 mmol/L — ABNORMAL HIGH (ref 98–111)
Creatinine, Ser: 2.87 mg/dL — ABNORMAL HIGH (ref 0.44–1.00)
GFR, Estimated: 16 mL/min — ABNORMAL LOW (ref >60)
Glucose, Bld: 88 mg/dL (ref 70–99)
Potassium: 4.1 mmol/L (ref 3.5–5.1)
Sodium: 138 mmol/L (ref 135–145)

## 2022-03-13 LAB — CBC
HCT: 22.4 % — ABNORMAL LOW (ref 36.0–46.0)
Hemoglobin: 7.1 g/dL — ABNORMAL LOW (ref 12.0–15.0)
MCH: 28.5 pg (ref 26.0–34.0)
MCHC: 31.7 g/dL (ref 30.0–36.0)
MCV: 90 fL (ref 80.0–100.0)
Platelets: 382 10*3/uL (ref 150–400)
RBC: 2.49 MIL/uL — ABNORMAL LOW (ref 3.87–5.11)
RDW: 13.6 % (ref 11.5–15.5)
WBC: 7.4 10*3/uL (ref 4.0–10.5)
nRBC: 0 % (ref 0.0–0.2)

## 2022-03-13 LAB — GLUCOSE, CAPILLARY
Glucose-Capillary: 108 mg/dL — ABNORMAL HIGH (ref 70–99)
Glucose-Capillary: 158 mg/dL — ABNORMAL HIGH (ref 70–99)
Glucose-Capillary: 91 mg/dL (ref 70–99)
Glucose-Capillary: 103 mg/dL — ABNORMAL HIGH (ref 70–99)

## 2022-03-13 MED ORDER — LACTATED RINGERS IV SOLN: INTRAVENOUS | Status: DC

## 2022-03-13 MED ORDER — MORPHINE SULFATE (PF) 2 MG/ML IV SOLN
2.0000 mg | Freq: Once | INTRAVENOUS | Status: DC
  Filled 2022-03-13: qty 1

## 2022-03-13 NOTE — Progress Notes (Signed)
?[facility]  ?PROGRESS NOTE  ? ?Subjective:  ? ?Patient resting quietly, alert and oriented ?Tolerating meals without nausea and vomiting ?Remains on room air ?Continues to complain of pain in left leg, denies recent falls ? ?Renal function continues to improve ? ?Intake/Output Summary (Last 24 hours) at 03/13/2022 1150 ?Last data filed at 03/13/2022 1029 ?Gross per 24 hour  ?Intake 120 ml  ?Output --  ?Net 120 ml  ?  ? ?Lab Results  ?Component Value Date  ? CREATININE 2.87 (H) 03/13/2022  ? CREATININE 3.36 (H) 03/12/2022  ? CREATININE 3.72 (H) 03/11/2022  ?  ? ?Objective:  ?Vital signs: ?Blood pressure (!) 143/65, pulse 78, temperature 98.5 ?F (36.9 ?C), resp. rate 18, height 5\' 3"  (1.6 m), weight 68 kg, SpO2 98 %. ? ?Intake/Output Summary (Last 24 hours) at 03/13/2022 1149 ?Last data filed at 03/13/2022 1029 ?Gross per 24 hour  ?Intake 120 ml  ?Output --  ?Net 120 ml  ? ? ?Filed Weights  ? 03/08/22 1137  ?Weight: 68 kg  ? ? ? ?Physical Exam: ?General:  No acute distress  ?Head:  Normocephalic, atraumatic. Moist oral mucosal membranes  ?Eyes:  Anicteric  ?Lungs:   Clear to auscultation, normal effort  ?Heart:  S1S2 no rubs  ?Abdomen:   Soft, nontender, bowel sounds present  ?Extremities:  No peripheral edema.  ?Neurologic:  Awake, alert, following commands  ?Skin:  No lesions  ?   ? ? ?Basic Metabolic Panel: ?Recent Labs  ?Lab 03/09/22 ?0409 03/10/22 ?0402 03/11/22 ?0973 03/12/22 ?0407 03/13/22 ?5329  ?NA 132* 138 137 137 138  ?K 3.8 3.4* 4.0 4.0 4.1  ?CL 108 113* 114* 114* 113*  ?CO2 16* 19* 16* 17* 16*  ?GLUCOSE 137* 50* 109* 100* 88  ?BUN 70* 64* 59* 54* 45*  ?CREATININE 5.34* 4.66* 3.72* 3.36* 2.87*  ?CALCIUM 7.6* 7.7* 7.6* 8.0* 8.2*  ? ? ? ?CBC: ?Recent Labs  ?Lab 03/09/22 ?0409 03/10/22 ?0402 03/11/22 ?9242 03/12/22 ?0407 03/13/22 ?6834  ?WBC 9.9 8.6 7.1 7.0 7.4  ?NEUTROABS  --  5.8 4.6 4.2  --   ?HGB 7.2* 8.3* 7.2* 7.5* 7.1*  ?HCT 22.1* 25.9* 22.6* 23.5* 22.4*  ?MCV 87.7 90.2 90.4 88.3 90.0  ?PLT  278 329 330 371 382  ? ? ? ? ?Urinalysis: ?No results for input(s): COLORURINE, LABSPEC, [place], GLUCOSEU, HGBUR, BILIRUBINUR, KETONESUR, PROTEINUR, UROBILINOGEN, NITRITE, LEUKOCYTESUR in the last 72 hours. ? ?Invalid input(s): APPERANCEUR ?  ? ? ?Imaging: ?MR LUMBAR SPINE WO CONTRAST ? ?Result Date: 03/12/2022 ?CLINICAL DATA:  Lumbar radiculopathy, no red flags, no prior management EXAM: MRI LUMBAR SPINE WITHOUT CONTRAST TECHNIQUE: Multiplanar, multisequence MR imaging of the lumbar spine was performed. No intravenous contrast was administered. COMPARISON:  CT abdomen/pelvis March 18, 23. FINDINGS: Segmentation: Standard segmentation is assumed with the inferior-most fully formed intervertebral disc labeled L5-S1. Alignment: Straightening the normal lumbar lordosis. No substantial sagittal subluxation. Vertebrae: Mild degenerative/discogenic endplate signal changes at L5-S1. Otherwise, no focal marrow edema to suggest acute fracture or discitis/osteomyelitis. No suspicious bone lesions. Conus medullaris and cauda equina: Conus extends to the L1 level. Conus appears normal. Paraspinal and other soft tissues: Unremarkable. Disc levels: T12-L1: No significant disc protrusion, foraminal stenosis, or canal stenosis. L1-L2: No significant disc protrusion, foraminal stenosis, or canal stenosis. L2-L3: No significant disc protrusion, foraminal stenosis, or canal stenosis. L3-L4: Mild disc bulging without significant canal or foraminal stenosis. L4-L5: Mild disc bulging and mild bilateral facet arthropathy. No significant canal or foraminal stenosis. Mild subarticular  recess narrowing on the right. L5-S1: Right eccentric degenerative disc height loss with associated mild endplate signal changes. Small broad disc bulge, eccentric to the right with bilateral facet arthropathy. Resulting mild right foraminal stenosis without significant canal or left foraminal stenosis. IMPRESSION: Mild multilevel degenerative change (detailed  above), including mild right foraminal stenosis at L5-S1 and mild right subarticular recess stenosis at L4-L5. Otherwise, no significant canal or foraminal stenosis. Electronically Signed   By: Margaretha Sheffield M.D.   On: 03/12/2022 11:55  ? ?US Venous Img Lower Unilateral Left (DVT) ? ?Result Date: 03/11/2022 ?CLINICAL DATA:  76 year old female with left lower extremity pain. EXAM: LEFT LOWER EXTREMITY VENOUS DOPPLER ULTRASOUND TECHNIQUE: Gray-scale sonography with graded compression, as well as color Doppler and duplex ultrasound were performed to evaluate the left lower extremity deep venous systems from the level of the common femoral vein and including the common femoral, femoral, profunda femoral, popliteal and calf veins including the posterior tibial, peroneal and gastrocnemius veins when visible. Spectral Doppler was utilized to evaluate flow at rest and with distal augmentation maneuvers in the common femoral, femoral and popliteal veins. The contralateral common femoral vein was also evaluated for comparison. COMPARISON:  None. FINDINGS: LEFT LOWER EXTREMITY Common Femoral Vein: No evidence of thrombus. Normal compressibility, respiratory phasicity and response to augmentation. Central Greater Saphenous Vein: No evidence of thrombus. Normal compressibility and flow on color Doppler imaging. Central Profunda Femoral Vein: No evidence of thrombus. Normal compressibility and flow on color Doppler imaging. Femoral Vein: No evidence of thrombus. Normal compressibility, respiratory phasicity and response to augmentation. Popliteal Vein: No evidence of thrombus. Normal compressibility, respiratory phasicity and response to augmentation. Calf Veins: No evidence of thrombus. Normal compressibility and flow on color Doppler imaging. Other Findings:  None. RIGHT LOWER EXTREMITY Common Femoral Vein: No evidence of thrombus. Normal compressibility, respiratory phasicity and response to augmentation. IMPRESSION: No  evidence of left lower extremity deep venous thrombosis. Ruthann Cancer, MD [facility] Electronically Signed   By: Ruthann Cancer M.D.   On: 03/11/2022 13:37   ? ? ?Medications:  ? ? lactated ringers 75 mL/hr at 03/13/22 0626  ? ? amLODipine  10 mg Oral Daily  ? cephALEXin  250 mg Oral Q24H  ? heparin injection (subcutaneous)  5,000 Units Subcutaneous Q8H  ? insulin detemir  10 Units Subcutaneous Daily  ? irbesartan  75 mg Oral Daily  ? omega-3 acid ethyl esters  1 g Oral Daily  ? sodium bicarbonate  650 mg Oral BID  ? tamsulosin  0.4 mg Oral Daily  ? ? ?Assessment/ Plan:  ?   ?Principal Problem: ?  AKI (acute kidney injury) ([place]) ?Active Problems: ?  DM (diabetes mellitus), type 1 ([place]) ?  Hypertension ?  Dyslipidemia ?  Acute unilateral obstructive uropathy ?  Hypoglycemia ?  Acute UTI ?  Right ureteral calculus ?  Left leg pain ?  Metabolic acidosis ?  Generalized weakness ? ?76 y.o. female with a PMHx of hypertension, coronary artery disease, diabetes, peripheral vascular disease, chronic kidney disease now comes back to the emergency room after discharge 3 days ago.  She was initially presented to the emergency room with flank pain associated with nausea and vomiting.  She was found to have right-sided nephrolithiasis with moderate hydronephrosis.  ?  ?#1: Acute kidney injury on chronic kidney disease st 4: Patient has CKD stage IV with baseline creatinine 1.83 and GFR 28. . The acute kidney injury is most likely  secondary to severe prerenal azotemia complicated by the use of ACE inhibitor's and diuretics.   ?Renal function improving.  Would suggest continuing IV fluids for 1 more day.  At discharge, patient will need follow-up appointment at our office. ?  ?#2:  Acute metabolic acidosis: The metabolic acidosis possibly can be due to acute kidney injury complicated by sepsis. ? Bicarb remains decreased to 16.  Continue sodium bicarb tabs. ?  ?#3:  Nephrolithiasis/hydronephrosis/sepsis: Patient is now s/p urological procedure and placement of right ureteral stent.  ? Patient continues to void without concern.  Continue Tamsulosin.   ? ?#4: Hypertension:

## 2022-03-13 NOTE — Progress Notes (Signed)
Cross Cover ?Dose of morphine ordered for severe pain in left leg with MRI ?

## 2022-03-13 NOTE — Progress Notes (Signed)
Physical Therapy Treatment ?Patient Details ?Name: Dana [name] ?MRN: [id number] ?DOB: 25-May-1946 ?Today's Date: 03/13/2022 ? ? ?History of Present Illness Patient is a 76 year old female with a PMH (+) for  type 1 diabetes mellitus, hypertension, CKD stage III, who presented to the emergency room on 03/04/21 with right lower quadrant abdominal pain, nausea and vomiting. Patient admitted for AKI. ? ?  ?PT Comments  ? ? Patient agreeable to PT. She reports overall her left leg pain has improved since yesterday but still complains of pain in the left leg with any active movement. She was initially agreeable to sit up on the side of bed but then declines therapist to assist with LLE mobility due to pain. Educated patient on importance of mobility, routine repositioning in bed for skin integrity, and LE exercises to be performed in bed. Of note, she denies any previous leg pain, back pain, or recent fall. She is declining SNF placement, although she is still requiring assistance to mobilize. PT will continue to follow to maximize independence and decrease caregiver burden.  ?   ?Recommendations for follow up therapy are one component of a multi-disciplinary discharge planning process, led by the attending physician.  Recommendations may be updated based on patient status, additional functional criteria and insurance authorization. ? ?Follow Up Recommendations ? Skilled nursing-short term rehab (<3 hours/day) ?  ?  ?Assistance Recommended at Discharge Frequent or constant Supervision/Assistance  ?Patient can return home with the following A little help with walking and/or transfers;A little help with bathing/dressing/bathroom;Help with stairs or ramp for entrance;Assist for transportation ?  ?Equipment Recommendations ?    ?  ?Recommendations for Other Services   ? ? ?  ?Precautions / Restrictions Precautions ?Precautions: Fall ?Restrictions ?Weight Bearing Restrictions: No  ?  ? ?Mobility ? Bed Mobility ?  ?Bed Mobility:  Rolling ?Rolling: Mod assist ?  ?  ?  ?  ?General bed mobility comments: patient agreeable to sit up on edge of bed initially. the patient requested to attempt moving on her own before therapist assisted. after minimal active movement of LLE, patient asked therapist to assist her but then declines bed mobility due to pain/fear of pain in LLE with movement. she did not sit up on the edge of bed. she reports discomfort of her coccyx area and patient educated on importance of repositioning in bed to prevent against skin breakdown in that area. moderate assistance required for rolling to right for repositioning with pillow under left hip (reports pain decreased) ?  ? ?Transfers ?  ?  ?  ?  ?  ?  ?  ?  ?  ?General transfer comment: not assessed as patient declined getting out of bed at this time. she does report she has been requiring only one person assistance to get up to the bed side commode ?  ? ?Ambulation/Gait ?  ?  ?  ?  ?  ?  ?  ?  ? ? ?Stairs ?  ?  ?  ?  ?  ? ? ?Wheelchair Mobility ?  ? ?Modified Rankin (Stroke Patients Only) ?  ? ? ?  ?Balance   ?  ?  ?  ?  ?  ?  ?  ?  ?  ?  ?  ?  ?  ?  ?  ?  ?  ?  ?  ? ?  ?Cognition Arousal/Alertness: Awake/alert ?Behavior During Therapy: [facility] for tasks assessed/performed ?Overall Cognitive Status: Within Functional Limits for tasks assessed ?  ?  ?  ?  ?  ?  ?  ?  ?  ?  ?  ?  ?  ?  ?  ?  ?  ?  ?  ? ?  ?  Exercises General Exercises - Lower Extremity ?Ankle Circles/Pumps: AROM, Strengthening, Both, 10 reps, Supine ?Quad Sets: AROM, Strengthening, Both, 5 reps, Supine ?Gluteal Sets: AROM, Strengthening, Both, 5 reps, Supine ?Other Exercises ?Other Exercises: patient able to performe ankle pumps with no pain. she does report pain with isometric exercises. encouraged patient to perform exercises as able for strengthening ? ?  ?General Comments   ?  ?  ? ?Pertinent Vitals/Pain Pain Assessment ?Pain Assessment: Faces ?Faces Pain Scale: Hurts little more ?Pain Location: the whole left  leg ?Pain Descriptors / Indicators: Discomfort ?Pain Intervention(s): Repositioned (offered ice pack but patient declined)  ? ? ?Home Living   ?  ?  ?  ?  ?  ?  ?  ?  ?  ?   ?  ?Prior Function    ?  ?  ?   ? ?PT Goals (current goals can now be found in the care plan section) Acute Rehab PT Goals ?Patient Stated Goal: not to go to a facility ?PT Goal Formulation: With patient ?Time For Goal Achievement: 03/26/22 ?Potential to Achieve Goals: Fair ?Progress towards PT goals: Not progressing toward goals - comment ? ?  ?Frequency ? ? ? Min 2X/week ? ? ? ?  ?PT Plan Current plan remains appropriate  ? ? ?Co-evaluation   ?  ?  ?  ?  ? ?  ?AM-PAC PT "6 Clicks" Mobility   ?Outcome Measure ? Help needed turning from your back to your side while in a flat bed without using bedrails?: A Lot ?Help needed moving from lying on your back to sitting on the side of a flat bed without using bedrails?: A Lot ?Help needed moving to and from a bed to a chair (including a wheelchair)?: A Little ?Help needed standing up from a chair using your arms (e.g., wheelchair or bedside chair)?: A Lot ?Help needed to walk in hospital room?: A Lot ?Help needed climbing 3-5 steps with a railing? : A Lot ?6 Click Score: 13 ? ?  ?End of Session   ?Activity Tolerance:  (self limiting) ?Patient left: in bed;with call bell/phone within reach;with bed alarm set ?Nurse Communication:  (discussed with MD via secure chat) ?PT Visit Diagnosis: Unsteadiness on feet (R26.81);Muscle weakness (generalized) (M62.81);Difficulty in walking, not elsewhere classified (R26.2);Pain ?Pain - Right/Left: Left ?Pain - part of body: Leg ?  ? ? ?Time: 5465-0354 ?PT Time Calculation (min) (ACUTE ONLY): 23 min ? ?Charges:  $Therapeutic Exercise: 8-22 mins ?$Therapeutic Activity: 8-22 mins          ?          ? ?Minna Merritts, PT, MPT ? ? ?Percell Locus ?03/13/2022, 1:08 PM ? ?

## 2022-03-13 NOTE — Consult Note (Signed)
ORTHOPAEDIC CONSULTATION ? ?REQUESTING PHYSICIAN: Ezekiel Slocumb, DO ? ?Chief Complaint:   ?Left lower extremity pain ? ?History of Present Illness: ?Dana [name] is a 76 y.o. female admitted on 03/08/2022 with right lower quadrant pain.  She was found to have a ureteral stone with hydronephrosis and significant acute kidney injury.  Patient then underwent right ureteroscopy and right ureteral stent placement that evening.  Patient states that she woke up on 03/09/2022 with significant left lower extremity pain in the region of the hip.  She attempted to stand up that day and her legs just buckled and she fell back into the bed.  She has been unable to fully bear weight since that time.  She describes the pain as a stabbing sensation in her thigh/groin with occasional pain down the leg.  She states that prior to admission, she had no significant pain in the left lower extremity and was able to ambulate prolonged distances without difficulty.  DVT scan was negative.  An MRI of the lumbar spine was obtained which did not show any source of her symptoms.  She does state that today her symptoms appear marginally improved compared to prior exams. ? ?Past Medical History:  ?Diagnosis Date  ? DM (diabetes mellitus), type 1 ([place])   ? Hypertension   ? ?Past Surgical History:  ?Procedure Laterality Date  ? CYSTOSCOPY WITH STENT PLACEMENT Right 03/08/2022  ? Procedure: CYSTOSCOPY WITH RIGHT URETERALSTENT PLACEMENT;  Surgeon: Abbie Sons, MD;  Location: [facility] ORS;  Service: Urology;  Laterality: Right;  ? HYSTERECTOMY ABDOMINAL WITH SALPINGO-OOPHORECTOMY    ? URETEROSCOPY Right 03/08/2022  ? Procedure: URETEROSCOPY;  Surgeon: Abbie Sons, MD;  Location: [facility] ORS;  Service: Urology;  Laterality: Right;  ? ?Social History  ? ?Socioeconomic History  ? Marital status: Married  ?  Spouse name: Not on file  ? Number of children: Not on file  ? Years of  education: Not on file  ? Highest education level: Not on file  ?Occupational History  ? Not on file  ?Tobacco Use  ? Smoking status: Never  ? Smokeless tobacco: Never  ?Vaping Use  ? Vaping Use: Never used  ?Substance and Sexual Activity  ? Alcohol use: Never  ? Drug use: Never  ? Sexual activity: Not on file  ?Other Topics Concern  ? Not on file  ?Social History Narrative  ? Not on file  ? ?Social Determinants of Health  ? ?Financial Resource Strain: Not on file  ?Food Insecurity: Not on file  ?Transportation Needs: Not on file  ?Physical Activity: Not on file  ?Stress: Not on file  ?Social Connections: Not on file  ? ?Family History  ?Problem Relation Age of Onset  ? Diabetes Mother   ? Hypertension Father   ? Diabetes Sister   ? Diabetes Brother   ? Diabetes Sister   ? Hypertension Sister   ? Diabetes Brother   ? Diabetes Brother   ? ?Allergies  ?Allergen Reactions  ? Clindamycin Dermatitis and Rash  ? Clindamycin/Lincomycin Rash  ? Chocolate   ?  Nosebleed  ? Chocolate Flavor   ?  Other reaction(s): Unknown ?Nosebleed  ? Food Color Red   ?  Other reaction(s): Unknown ?Other reaction(s): Unknown  ? Food Color Yellow   ?  Other reaction(s): Unknown ?Other reaction(s): Unknown  ? Aspirin Rash  ?  Other reaction(s): Unknown: Document details in comments ?Pt states her PCP told her she was allergic to ASA.  Pt does not  remember having a reaction.  ?Pt states her PCP told her she was allergic to ASA.  Pt does not remember having a reaction.  ?  ? ?Prior to Admission medications   ?Medication Sig Start Date End Date Taking? Authorizing Provider  ?acetaminophen (TYLENOL) 500 MG tablet Take 500 mg by mouth every 6 (six) hours as needed for mild pain.   Yes [provider]  ?amLODipine (NORVASC) 10 MG tablet TAKE 1 TABLET BY MOUTH EVERY DAY 05/24/21  Yes Isaac Bliss, Rayford Halsted, MD  ?aspirin 81 MG EC tablet Take 81 mg by mouth See admin instructions. Takes on Tues and Thur only due to history of eye bleed.    Yes [provider]  ?B Complex-C (B-COMPLEX WITH VITAMIN C) tablet Take 1 tablet by mouth daily.   Yes [provider]  ?JARDIANCE 10 MG TABS tablet Take 10 mg by mouth daily. 11/16/21  Yes [provider]  ?LEVEMIR FLEXTOUCH 100 UNIT/ML FlexTouch Pen INJECT 60 UNITS INTO THE SKIN DAILY. 09/03/21  Yes Isaac Bliss, Rayford Halsted, MD  ?Omega-3 Fatty Acids (FISH OIL PO) Take 1 capsule by mouth daily.   Yes [provider]  ?ondansetron (ZOFRAN-ODT) 8 MG disintegrating tablet Take 1 tablet (8 mg total) by mouth every 8 (eight) hours as needed for nausea or vomiting. 03/04/22  Yes Naaman Plummer, MD  ?prednisoLONE acetate (PRED FORTE) 1 % ophthalmic suspension Place 1 drop into the left eye 4 (four) times daily. 01/28/22  Yes [provider]  ?rosuvastatin (CRESTOR) 10 MG tablet Take 10 mg by mouth daily. 03/02/22  Yes [provider]  ?telmisartan-hydrochlorothiazide (MICARDIS HCT) 40-12.5 MG tablet Take 1 tablet by mouth daily. 01/28/22  Yes [provider]  ?Turmeric 500 MG TABS Take 500 mg by mouth daily at 6 (six) AM.   Yes [provider]  ?vitamin C (ASCORBIC ACID) 500 MG tablet Take 500 mg by mouth 2 (two) times daily.   Yes [provider]  ?VITAMIN D PO Take 2 tablets by mouth daily.   Yes [provider]  ? ?Recent Labs  ?  03/11/22 ?0272 03/12/22 ?0407 03/13/22 ?5366  ?WBC 7.1 7.0 7.4  ?HGB 7.2* 7.5* 7.1*  ?HCT 22.6* 23.5* 22.4*  ?PLT 330 371 382  ?K 4.0 4.0 4.1  ?CL 114* 114* 113*  ?CO2 16* 17* 16*  ?BUN 59* 54* 45*  ?CREATININE 3.72* 3.36* 2.87*  ?GLUCOSE 109* 100* 88  ?CALCIUM 7.6* 8.0* 8.2*  ? ?MR LUMBAR SPINE WO CONTRAST ? ?Result Date: 03/12/2022 ?CLINICAL DATA:  Lumbar radiculopathy, no red flags, no prior management EXAM: MRI LUMBAR SPINE WITHOUT CONTRAST TECHNIQUE: Multiplanar, multisequence MR imaging of the lumbar spine was performed. No intravenous contrast was administered. COMPARISON:  CT abdomen/pelvis March 18,  23. FINDINGS: Segmentation: Standard segmentation is assumed with the inferior-most fully formed intervertebral disc labeled L5-S1. Alignment: Straightening the normal lumbar lordosis. No substantial sagittal subluxation. Vertebrae: Mild degenerative/discogenic endplate signal changes at L5-S1. Otherwise, no focal marrow edema to suggest acute fracture or discitis/osteomyelitis. No suspicious bone lesions. Conus medullaris and cauda equina: Conus extends to the L1 level. Conus appears normal. Paraspinal and other soft tissues: Unremarkable. Disc levels: T12-L1: No significant disc protrusion, foraminal stenosis, or canal stenosis. L1-L2: No significant disc protrusion, foraminal stenosis, or canal stenosis. L2-L3: No significant disc protrusion, foraminal stenosis, or canal stenosis. L3-L4: Mild disc bulging without significant canal or foraminal stenosis. L4-L5: Mild disc bulging and mild bilateral facet arthropathy. No significant canal or  foraminal stenosis. Mild subarticular recess narrowing on the right. L5-S1: Right eccentric degenerative disc height loss with associated mild endplate signal changes. Small broad disc bulge, eccentric to the right with bilateral facet arthropathy. Resulting mild right foraminal stenosis without significant canal or left foraminal stenosis. IMPRESSION: Mild multilevel degenerative change (detailed above), including mild right foraminal stenosis at L5-S1 and mild right subarticular recess stenosis at L4-L5. Otherwise, no significant canal or foraminal stenosis. Electronically Signed   By: Margaretha Sheffield M.D.   On: 03/12/2022 11:55   ? ? ?Positive ROS: All other systems have been reviewed and were otherwise negative with the exception of those mentioned in the HPI and as above. ? ?Physical Exam: ?BP (!) 143/65   Pulse 78   Temp 98.5 ?F (36.9 ?C)   Resp 18   Ht 5\' 3"  (1.6 m)   Wt 68 kg   SpO2 98%   BMI 26.56 kg/m?  ?General:  Alert, no acute distress ?Psychiatric:   Patient is competent for consent with normal mood and affect   ?Cardiovascular:  No pedal edema, regular rate and rhythm ?Respiratory:  No wheezing, non-labored breathing ?GI:  Abdomen is soft and non-tender ?Skin:  No les

## 2022-03-13 NOTE — TOC Progression Note (Signed)
Transition of Care (TOC) - Progression Note  ? ? ?Patient Details  ?Name: Sueanne Maniaci ?MRN: [id number] ?Date of Birth: 1946/11/25 ? ?Transition of Care (TOC) CM/SW Contact  ?Beverly Sessions, RN ?Phone Number: ?03/13/2022, 9:24 AM ? ?Clinical Narrative:    ? ?Referral accepted by [facility] with Alvis Lemmings   ? ?Expected Discharge Plan: Home/Self Care ?Barriers to Discharge: Continued Medical Work up ? ?Expected Discharge Plan and Services ?Expected Discharge Plan: Home/Self Care ?  ?  ?Post Acute Care Choice: Home Health ?Living arrangements for the past 2 months: [place] ?                ?DME Arranged: Walker rolling, 3-N-1 ?DME Agency: [employer] ?  ?  ?  ?  ?  ?  ?  ?  ? ? ?Social Determinants of Health (SDOH) Interventions ?  ? ?Readmission Risk Interventions ? ?  03/09/2022  ? 11:45 AM  ?Readmission Risk Prevention Plan  ?Transportation Screening Complete  ?PCP or Specialist Appt within 3-5 Days Complete  ?[place] or Home Care Consult Complete  ?Social Work Consult for [place] Planning/Counseling Complete  ?Palliative Care Screening Not Applicable  ?Medication Review Press photographer) Complete  ? ? ?

## 2022-03-13 NOTE — Assessment & Plan Note (Addendum)
PT evaluated, SNF/rehab recommended. ?TOC following.  Patient declined SNF but agreeable to home health. ?--Mobilize as much as tolerated ?-- Home health PT arranged ?-- Rolling walker bedside commode provided ?

## 2022-03-13 NOTE — Progress Notes (Signed)
?Progress Note ? ? ?Patient: Dana [name] IRJ:[id number] DOB: 01-Dec-1946 DOA: 03/08/2022     5 ?DOS: the patient was seen and examined on 03/13/2022 ?  ?Brief hospital course: ?Dana [name] is a 76 y.o. female with medical history significant for type 1 diabetes mellitus, hypertension, CKD stage IV, who presented to the ED on 3/14 with right lower quadrant abdominal pain, nausea and vomiting.  She was found to have a 5 mm right proximal ureteral calculus with mild hydronephrosis/hydroureter.  She was treated with analgesics and was discharged home from the ED on hydrocodone, Flomax and Zofran.  She presented again on 03/08/2022 with ongoing nausea, vomiting and worsening right lower quadrant pain radiating to the right flank.  Repeat CT abdomen and pelvis showed persistent 5 mm right proximal ureteral stone with moderate right hydronephrosis.  She was also found to have AKI, likely postobstructive uropathy. ?  ?She was treated with IV fluids and empiric IV antibiotics. Urology was consulted, patient underwent right ureteroscopy and right ureteral stent placement.  10 mL of purulent urine was aspirated from the renal pelvis during the procedure. ? ?3/23: Cr improving, remains on IV fluids.  Persistent metabolic acidosis with CO2 down to 16 today.  Ongoing severe LLE pain limiting all mobility.  Ortho consulted. ? ?Assessment and Plan: ?* AKI (acute kidney injury) ([place]) ?Presented with creatinine of 5.81.  Has baseline CKD stage IV, baseline creatinine about 1.8.  AKI felt likely to prerenal azotemia with continuation of ACE inhibitor and diuretic in addition to obstructive uropathy ?-- Nephrology following ?-- Continue IV fluids, changed to LR due to hyperchloremia and worsening acidosis ?--Daily BMP to monitor ?-- Avoid nephrotoxins, hypotension ?-- Renally dose meds as indicated ?-- Monitor urine output ? ?Acute unilateral obstructive uropathy ?Status post right ureteral stent placement with urology on  3/18. ? ? ? ?DM (diabetes mellitus), type 1 ([place]) ?With renal insufficiency, patient has had recurrent hypoglycemic episodes. ?Levemir reduced 60>>30>>10 units daily. ?Monitor CBGs and adjust insulin as needed for goal 140-180 ? ? ?Hypertension ?Continue amlodipine.  ARB resumed. ?BPs have been mildly elevated ? ?Generalized weakness ?PT evaluated, SNF/rehab recommended. ?TOC following.  Patient declining SNF but agreeable to home health. ?--Mobilize as much as tolerated ?--Fall precautions ?--PT/OT while here ? ?Metabolic acidosis ?Mildly elevated anion gap 13 on admission in the setting of acute renal failure.  Gap now closed, with hyperchloremia having been on normal saline since admission.   ?Renal function improving. ?-- Nephrology following ?-- Started on bicarb tablets ?-- Monitor BMP ?-- Change IVF's to LR ? ?Left leg pain ?Patient has an onset of severe left lower extremity of pain from the hip to the ankle/toes.  Doppler ultrasound negative for DVT.  No apparent trauma or aggravating factor per history patient gives. ? ?Patient's description of symptoms consistent with lumbar radiculopathy, but MRI lumbar spine negative any left-sided pathology (showed mild multilevel degenerative changes with mild right foraminal stenosis at L5-S1 and mild right subarticular recess stenosis at L4-L5.  No other significant canal or foraminal stenosis) ? ?-- With renal insufficiency, unable to try gabapentin and patient declines due to side effects ?-- Continue Tylenol and/or Norco as needed ?-- Consult orthopedic surgery for further input ?-- Continue PT/OT, mobilize as much as tolerated ? ?Right ureteral calculus ?Status post ureteral stent placement by urology on 3/18.  Management as outlined per urology.  Foley removed.  Continue Flomax. ? ?Acute UTI ?Urine culture with no growth. ?On Keflex through 3/24. ? ?Hypoglycemia ?Likely due  to renal insufficiency and use of insulin.  Levemir dose has been reduced. ?--  Hypoglycemia protocol, CBGs ? ?Dyslipidemia ?Continue statin ? ? ? ? ?  ? ?Subjective: Patient awake resting in bed when seen this morning.  She continues to have left leg pain from her hip to the ankle that severely limiting her functional ability.  Pain medication helps but patient is typically able to walk 30 or more minutes every day currently having too much pain to tolerate mobility.  She otherwise reports feeling well but concerned about this new pain and lack of an explanation for it. ? ?Physical Exam: ?Vitals:  ? 03/12/22 1534 03/12/22 2012 03/13/22 4158 03/13/22 0751  ?BP: (!) 156/77 (!) 151/71 (!) 149/73 (!) 143/65  ?Pulse: 89 94 91 78  ?Resp: 18 18 18    ?Temp: 98.4 ?F (36.9 ?C) 98.6 ?F (37 ?C) 99.1 ?F (37.3 ?C) 98.5 ?F (36.9 ?C)  ?TempSrc: Oral Oral Oral   ?SpO2: 98% 99% 97% 98%  ?Weight:      ?Height:      ? ?General exam: awake, alert, no acute distress ?HEENT: moist mucus membranes, hearing grossly normal  ?Respiratory system: normal respiratory effort, on room air. ?Cardiovascular system: normal S1/S2, RRR, no pedal edema.   ?Central nervous system: A&O x4. B/L lower extremity plantar and dorsi flexion symmetric and 5/5 in strength, further LLE motor testing limited by pain and guarding, otherwise no focal neurologic deficits, normal speech ?Extremities: left leg pain on PROM for hip flexion and knee extension, painless L ankle AROM, no LLE area of tenderness, redness, swelling seen ?Skin: dry, intact, normal temperature, No rashes, lesions or ulcers seen  ?Psychiatry: normal mood, congruent affect, judgement and insight appear normal ? ? ? ?Data Reviewed: ? ?Labs reviewed notable for chloride 113, CO2 16 down from 17, BUN 45, creatinine 2.87, calcium 8.2, GFR 16.  Hemoglobin 7.1 ? ?Family Communication: none ? ?Disposition: ?Status is: Inpatient ?Remains inpatient appropriate because: Severity of illness with persistent AKI and metabolic acidosis on IV fluids.  Ongoing evaluation of new onset severe  left lower extremity pain. ? ? ? Planned Discharge Destination: Skilled nursing facility versus home with home health ? ? ? ?Time spent: 45 minutes ? ?Author: ?Ezekiel Slocumb, DO ?03/13/2022 1:15 PM ? ?For on call review [email or web address].  ?

## 2022-03-14 DIAGNOSIS — N179 Acute kidney failure, unspecified: Secondary | ICD-10-CM | POA: Diagnosis not present

## 2022-03-14 LAB — GLUCOSE, CAPILLARY
Glucose-Capillary: 183 mg/dL — ABNORMAL HIGH (ref 70–99)
Glucose-Capillary: 146 mg/dL — ABNORMAL HIGH (ref 70–99)
Glucose-Capillary: 133 mg/dL — ABNORMAL HIGH (ref 70–99)
Glucose-Capillary: 125 mg/dL — ABNORMAL HIGH (ref 70–99)
Glucose-Capillary: 95 mg/dL (ref 70–99)
Glucose-Capillary: 110 mg/dL — ABNORMAL HIGH (ref 70–99)

## 2022-03-14 LAB — HEPATIC FUNCTION PANEL
ALT: 23 U/L (ref 0–44)
AST: 28 U/L (ref 15–41)
Albumin: 2 g/dL — ABNORMAL LOW (ref 3.5–5.0)
Alkaline Phosphatase: 96 U/L (ref 38–126)
Bilirubin, Direct: <0.1 mg/dL (ref 0.0–0.2)
Total Bilirubin: 0.3 mg/dL (ref 0.3–1.2)
Total Protein: 6.1 g/dL — ABNORMAL LOW (ref 6.5–8.1)

## 2022-03-14 LAB — BASIC METABOLIC PANEL
Anion gap: 3 — ABNORMAL LOW (ref 5–15)
BUN: 39 mg/dL — ABNORMAL HIGH (ref 8–23)
CO2: 22 mmol/L (ref 22–32)
Calcium: 8.2 mg/dL — ABNORMAL LOW (ref 8.9–10.3)
Chloride: 116 mmol/L — ABNORMAL HIGH (ref 98–111)
Creatinine, Ser: 2.73 mg/dL — ABNORMAL HIGH (ref 0.44–1.00)
GFR, Estimated: 17 mL/min — ABNORMAL LOW (ref >60)
Glucose, Bld: 124 mg/dL — ABNORMAL HIGH (ref 70–99)
Potassium: 4.2 mmol/L (ref 3.5–5.1)
Sodium: 141 mmol/L (ref 135–145)

## 2022-03-14 LAB — CBC
HCT: 23.6 % — ABNORMAL LOW (ref 36.0–46.0)
Hemoglobin: 7.4 g/dL — ABNORMAL LOW (ref 12.0–15.0)
MCH: 28.4 pg (ref 26.0–34.0)
MCHC: 31.4 g/dL (ref 30.0–36.0)
MCV: 90.4 fL (ref 80.0–100.0)
Platelets: 415 10*3/uL — ABNORMAL HIGH (ref 150–400)
RBC: 2.61 MIL/uL — ABNORMAL LOW (ref 3.87–5.11)
RDW: 13.4 % (ref 11.5–15.5)
WBC: 6.3 10*3/uL (ref 4.0–10.5)
nRBC: 0 % (ref 0.0–0.2)

## 2022-03-14 LAB — SEDIMENTATION RATE: Sed Rate: 125 mm/hr — ABNORMAL HIGH (ref 0–30)

## 2022-03-14 LAB — CK: Total CK: 57 U/L (ref 38–234)

## 2022-03-14 NOTE — Progress Notes (Addendum)
Physical Therapy Treatment ?Patient Details ?Name: Dana [name] ?MRN: [id number] ?DOB: September 01, 1946 ?Today's Date: 03/14/2022 ? ? ?History of Present Illness Patient is a 76 year old female with a PMH (+) for  type 1 diabetes mellitus, hypertension, CKD stage III, who presented to the emergency room on 03/04/21 with right lower quadrant abdominal pain, nausea and vomiting. Patient admitted for AKI. ? ?  ?PT Comments  ? ? Pt seen for PT tx with pt agreeable to tx but instructing PT on how she wants to mobilize during session. Pt not agreeable to getting out of bed on R, nor getting in recliner on this date. Pt completes supine<>sit with supervision<>min assist with heavy reliance on hospital bed features & significantly extra time. Pt is able to complete bed<>BSC without AD but BUE support on bed rails/armrests of BSC throughout entire transfer with pt requiring supervision increasing to min assist. Pt is able to weight bear through LLE during transfer but at times, shuffles feet across floor vs stepping. Pt would benefit from ongoing PT services to initiate gait training with LRAD & for LLE strengthening as pt can tolerate.  ?   ?Recommendations for follow up therapy are one component of a multi-disciplinary discharge planning process, led by the attending physician.  Recommendations may be updated based on patient status, additional functional criteria and insurance authorization. ? ?Follow Up Recommendations ? Skilled nursing-short term rehab (<3 hours/day) ?  ?  ?Assistance Recommended at Discharge Frequent or constant Supervision/Assistance  ?Patient can return home with the following A little help with walking and/or transfers;A little help with bathing/dressing/bathroom;Help with stairs or ramp for entrance;Assist for transportation;Assistance with cooking/housework ?  ?Equipment Recommendations ? Rolling walker (2 wheels);BSC/3in1  ?  ?Recommendations for Other Services OT consult ? ? ?  ?Precautions /  Restrictions Precautions ?Precautions: Fall ?Restrictions ?Weight Bearing Restrictions: No  ?  ? ?Mobility ? Bed Mobility ?Overal bed mobility: Needs Assistance ?Bed Mobility: Supine to Sit, Sit to Supine ?  ?  ?Supine to sit: Min assist, HOB elevated (Pt slowly scoots LLE to EOB then uses bed rails to pull trunk upright.) ?Sit to supine: Min assist, HOB elevated (Pt requires min assist to elevate LLE onto bed.) ?  ?General bed mobility comments: PT educates pt on how to use bed control to adjust [facility] & foot of bed without relying on PT to do so (pt initially unwilling to allow PT to make foot of bed flat). Pt relies heavily on HOB elevated & bed rails. Pt requires significantly extra time to complete supine<>sit. ?  ? ?Transfers ?Overall transfer level: Needs assistance ?Equipment used: None ?Transfers: Sit to/from Stand, Bed to chair/wheelchair/BSC ?Sit to Stand: Supervision ?Stand pivot transfers: Min assist ?  ?  ?  ?  ?General transfer comment: Pt elects to transfer STS from EOB & BSC with use of bed rails/armrests & pt able to do so with extra time & supervision. Pt able to complete stand pivot bed>BSC on L with close supervision with pt holding to bed rails/armrests of BSC throughout. Once standing after toileting pt experiences LOB & requires min assist to correct & complete stand pivot BSC>bed on R. During transfer back to bed pt does not step & instead shuffles feet across floor to advance them. Pt is able to weight bear through LLE on this date & even comments on this herself. ?  ? ?Ambulation/Gait ?  ?  ?  ?  ?  ?  ?  ?  ? ? ?Stairs ?  ?  ?  ?  ?  ? ? ?  Wheelchair Mobility ?  ? ?Modified Rankin (Stroke Patients Only) ?  ? ? ?  ?Balance Overall balance assessment: Needs assistance ?Sitting-balance support: Bilateral upper extremity supported, Feet supported ?Sitting balance-Leahy Scale: Fair ?  ?  ?Standing balance support: During functional activity, Bilateral upper extremity supported ?Standing  balance-Leahy Scale: Fair ?  ?  ?  ?  ?  ?  ?  ?  ?  ?  ?  ?  ?  ? ?  ?Cognition Arousal/Alertness: Awake/alert ?Behavior During Therapy: [facility] for tasks assessed/performed ?Overall Cognitive Status: Within Functional Limits for tasks assessed ?  ?  ?  ?  ?  ?  ?  ?  ?  ?  ?  ?  ?  ?  ?  ?  ?General Comments: Pt not open to trying different things re: mobility during session despite PT encouragement/education. Pt likes to coordinate care during session. ?  ?  ? ?  ?Exercises Other Exercises ?Other Exercises: Encouraged pt to attempt LLE LAQ with pt only flexing hip & reports inability to extend knee; pt reports this is more than she has been able to do since admission. ? ?  ?General Comments General comments (skin integrity, edema, etc.): Pt with continent void on [facility] & performs peri hygiene without assistance. ?  ?  ? ?Pertinent Vitals/Pain Pain Assessment ?Pain Assessment: Faces ?Faces Pain Scale: Hurts little more ?Pain Location: L hip ?Pain Descriptors / Indicators: Discomfort ?Pain Intervention(s): Monitored during session  ? ? ?Home Living   ?  ?  ?  ?  ?  ?  ?  ?  ?  ?   ?  ?Prior Function    ?  ?  ?   ? ?PT Goals (current goals can now be found in the care plan section) Acute Rehab PT Goals ?Patient Stated Goal: not to go to a facility ?PT Goal Formulation: With patient ?Time For Goal Achievement: 03/26/22 ?Potential to Achieve Goals: Fair ?Progress towards PT goals: Progressing toward goals ? ?  ?Frequency ? ? ? Min 2X/week ? ? ? ?  ?PT Plan Equipment recommendations need to be updated  ? ? ?Co-evaluation   ?  ?  ?  ?  ? ?  ?AM-PAC PT "6 Clicks" Mobility   ?Outcome Measure ? Help needed turning from your back to your side while in a flat bed without using bedrails?: A Lot ?Help needed moving from lying on your back to sitting on the side of a flat bed without using bedrails?: A Lot ?Help needed moving to and from a bed to a chair (including a wheelchair)?: A Little ?Help needed standing up from a chair using  your arms (e.g., wheelchair or bedside chair)?: A Little ?Help needed to walk in hospital room?: A Lot ?Help needed climbing 3-5 steps with a railing? : Total ?6 Click Score: 13 ? ?  ?End of Session   ?Activity Tolerance: Patient tolerated treatment well ?Patient left: in bed;with nursing/sitter in room ?Nurse Communication: Mobility status ?PT Visit Diagnosis: Unsteadiness on feet (R26.81);Muscle weakness (generalized) (M62.81);Difficulty in walking, not elsewhere classified (R26.2);Pain ?Pain - Right/Left: Left ?Pain - part of body: Hip ?  ? ? ?Time: 2841-3244 ?PT Time Calculation (min) (ACUTE ONLY): 24 min ? ?Charges:  $Therapeutic Activity: 23-37 mins          ?          ? ?Lavone Nian, PT, DPT ?03/14/22, 2:21 PM ? ? ? ?Waunita Schooner ?03/14/2022, 2:21 PM ? ?

## 2022-03-14 NOTE — Evaluation (Signed)
Occupational Therapy Evaluation ?Patient Details ?Name: Dana [name] ?MRN: [id number] ?DOB: [birth year] ?Today's Date: 03/14/2022 ? ? ?History of Present Illness Patient is a 76 year old female with a PMH (+) for  type 1 diabetes mellitus, hypertension, CKD stage III, who presented to the emergency room on 03/04/21 with right lower quadrant abdominal pain, nausea and vomiting. Patient admitted for AKI.  ? ?Clinical Impression ?  ?Patient presenting with decreased Ind in self care,balance, functional mobility/transfers, endurance, and safety awareness. Patient reports living in motel with husband independently at baseline. She reports independence in all aspects of self care and IADLs. Patient is agreeable to OT intervention. She with heavy use of bed rails for repositioning in bed. She refused OOB activity this session secondary to pain and reports physican telling her to "take it easy and that is will take time". Patient will benefit from acute OT to increase overall independence in the areas of ADLs, functional mobility, and safety awareness in order to safely discharge to next venue of care.  ?   ? ?Recommendations for follow up therapy are one component of a multi-disciplinary discharge planning process, led by the attending physician.  Recommendations may be updated based on patient status, additional functional criteria and insurance authorization.  ? ?Follow Up Recommendations ? Skilled nursing-short term rehab (<3 hours/day)  ?  ?Assistance Recommended at Discharge Frequent or constant Supervision/Assistance  ?Patient can return home with the following A lot of help with walking and/or transfers;A lot of help with bathing/dressing/bathroom;Assistance with cooking/housework;Assist for transportation;Help with stairs or ramp for entrance ? ?  ?Functional Status Assessment ? Patient has had a recent decline in their functional status and demonstrates the ability to make significant improvements in function in a  reasonable and predictable amount of time.  ?Equipment Recommendations ? Other (comment) (defer to next venue of care)  ?  ?   ?Precautions / Restrictions Precautions ?Precautions: Fall ?Restrictions ?Weight Bearing Restrictions: No  ? ?  ? ?Mobility Bed Mobility ?Overal bed mobility: Needs Assistance ?Bed Mobility: Rolling ?Rolling: Mod assist ?  ?  ?  ?  ?  ?  ? ?Transfers ?  ?  ?  ?  ?  ?  ?  ?  ?  ?General transfer comment: Pt refusal ?  ? ?  ?   ? ?ADL either performed or assessed with clinical judgement  ? ? ? ? ?Vision Patient Visual Report: No change from baseline ?   ?   ?   ?   ? ?Pertinent Vitals/Pain Pain Assessment ?Pain Assessment: Faces ?Faces Pain Scale: Hurts even more ?Pain Location: L hip ?Pain Descriptors / Indicators: Discomfort ?Pain Intervention(s): Monitored during session, Repositioned  ? ? ? ?Hand Dominance Right ?  ?Extremity/Trunk Assessment Upper Extremity Assessment ?Upper Extremity Assessment: Generalized weakness ?  ?Lower Extremity Assessment ?Lower Extremity Assessment: Defer to PT evaluation ?  ?  ?  ?Communication Communication ?Communication: No difficulties ?  ?Cognition Arousal/Alertness: Awake/alert ?Behavior During Therapy: [facility] for tasks assessed/performed ?Overall Cognitive Status: Within Functional Limits for tasks assessed ?  ?  ?  ?  ?  ?  ?  ?  ?  ?  ?  ?  ?  ?  ?  ?  ?  ?  ?  ?General Comments  Pt with continent void on [facility] & performs peri hygiene without assistance. ? ?  ?   ?   ? ? ?Home Living Family/patient expects to be discharged to:: Private residence ?Living Arrangements: Spouse/significant  other ?Available Help at Discharge: Family;Available 24 hours/day ?Type of Home: Other(Comment) (motel) ?Home Access: Level entry ?  ?  ?Home Layout: One level ?  ?  ?Bathroom Shower/Tub: Tub/shower unit ?  ?Bathroom Toilet: Standard ?  ?  ?Home Equipment: Grab bars - tub/shower;Grab bars - toilet ?  ?  ?  ? ?  ?Prior Functioning/Environment Prior Level of Function :  Independent/Modified Independent ?  ?  ?  ?  ?  ?  ?  ?  ?  ? ?  ?  ?OT Problem List: Decreased strength;Decreased range of motion;Decreased activity tolerance;Impaired balance (sitting and/or standing);Decreased knowledge of use of DME or AE;Decreased safety awareness;Pain ?  ?   ?OT Treatment/Interventions: Self-care/ADL training;Therapeutic exercise;Therapeutic activities;Energy conservation;DME and/or AE instruction;Patient/family education;Manual therapy;Balance training  ?  ?OT Goals(Current goals can be found in the care plan section) Acute Rehab OT Goals ?Patient Stated Goal: to go home ?OT Goal Formulation: With patient ?Time For Goal Achievement: 03/28/22 ?Potential to Achieve Goals: Fair ?ADL Goals ?Pt Will Perform Grooming: with modified independence;standing ?Pt Will Perform Lower Body Dressing: with supervision;sit to/from stand ?Pt Will Transfer to Toilet: with supervision;ambulating ?Pt Will Perform Toileting - Clothing Manipulation and hygiene: with supervision;sit to/from stand  ?OT Frequency: Min 2X/week ?  ? ?   ?AM-PAC OT "6 Clicks" Daily Activity     ?Outcome Measure Help from another person eating meals?: None ?Help from another person taking care of personal grooming?: A Little ?Help from another person toileting, which includes using toliet, bedpan, or urinal?: A Lot ?Help from another person bathing (including washing, rinsing, drying)?: A Lot ?Help from another person to put on and taking off regular upper body clothing?: A Little ?Help from another person to put on and taking off regular lower body clothing?: A Lot ?6 Click Score: 16 ?  ?End of Session Nurse Communication: Mobility status ? ?Activity Tolerance: Patient tolerated treatment well ?Patient left: in bed;with call bell/phone within reach;with bed alarm set ? ?OT Visit Diagnosis: Unsteadiness on feet (R26.81);Repeated falls (R29.6);Muscle weakness (generalized) (M62.81)  ?              ?Time: 9191-6606 ?OT Time Calculation  (min): 16 min ?Charges:  OT General Charges ?$OT Visit: 1 Visit ?OT Evaluation ?$OT Eval Moderate Complexity: 1 Mod ? ?Darleen Crocker, [place], OTR/L , CBIS ?ascom [phone]  ?03/14/22, 3:34 PM  ?

## 2022-03-14 NOTE — Plan of Care (Signed)
?  Problem: Clinical Measurements: ?Goal: Ability to maintain clinical measurements within normal limits will improve ?Outcome: Progressing ?Goal: Will remain free from infection ?Outcome: Progressing ?Goal: Diagnostic test results will improve ?Outcome: Progressing ?Goal: Respiratory complications will improve ?Outcome: Progressing ?Goal: Cardiovascular complication will be avoided ?Outcome: Progressing ?  ?Pt is involved in and agrees with the plan of care. V/S stable. Complained of pain on her left leg; Hydrocodone given. ?

## 2022-03-14 NOTE — Progress Notes (Signed)
?Progress Note ? ? ?Patient: Dana [name] EHM:[id number] DOB: 12-11-1946 DOA: 03/08/2022     6 ?DOS: the patient was seen and examined on 03/14/2022 ?  ?Brief hospital course: ?Dana [name] is a 76 y.o. female with medical history significant for type 1 diabetes mellitus, hypertension, CKD stage IV, who presented to the ED on 3/14 with right lower quadrant abdominal pain, nausea and vomiting.  She was found to have a 5 mm right proximal ureteral calculus with mild hydronephrosis/hydroureter.  She was treated with analgesics and was discharged home from the ED on hydrocodone, Flomax and Zofran.  She presented again on 03/08/2022 with ongoing nausea, vomiting and worsening right lower quadrant pain radiating to the right flank.  Repeat CT abdomen and pelvis showed persistent 5 mm right proximal ureteral stone with moderate right hydronephrosis.  She was also found to have AKI, likely postobstructive uropathy. ?  ?She was treated with IV fluids and empiric IV antibiotics. Urology was consulted, patient underwent right ureteroscopy and right ureteral stent placement.  10 mL of purulent urine was aspirated from the renal pelvis during the procedure. ? ?3/23: Cr improving, remains on IV fluids.  Persistent metabolic acidosis with CO2 down to 16 today.  Ongoing severe LLE pain limiting all mobility.  Ortho consulted. ? ?Assessment and Plan: ?* AKI (acute kidney injury) ([place]) ?Presented with creatinine of 5.81.  Has baseline CKD stage IV, baseline creatinine about 1.8.  AKI felt likely to prerenal azotemia with continuation of ACE inhibitor and diuretic in addition to obstructive uropathy. ? ?Renal function slowly improving. ?-- Nephrology following ?-- Stopping IV fluids today ?-- Encourage PO hydration ?-- Daily BMP to monitor ?-- Avoid nephrotoxins, hypotension ?-- Renally dose meds as indicated ?-- Monitor urine output ? ?Acute unilateral obstructive uropathy ?Status post right ureteral stent placement with urology on  3/18. ? ? ? ?DM (diabetes mellitus), type 1 ([place]) ?With renal insufficiency, patient has had recurrent hypoglycemic episodes. ?Levemir reduced 60>>30>>10 units daily. ?Monitor CBGs and adjust insulin as needed for goal 140-180 ? ? ?Hypertension ?Continue amlodipine.  ARB resumed. ?BPs have been mildly elevated ? ?Generalized weakness ?PT evaluated, SNF/rehab recommended. ?TOC following.  Patient declining SNF but agreeable to home health. ?--Mobilize as much as tolerated ?--Fall precautions ?--PT/OT while here ? ?Metabolic acidosis ?Mildly elevated anion gap 13 on admission in the setting of acute renal failure.  Gap now closed, with hyperchloremia having been on normal saline since admission.   ?Renal function improving. ?-- Nephrology following ?-- Started on bicarb tablets ?-- Monitor BMP ?-- Change IVF's to LR ? ?Left leg pain ?Patient has an onset of severe left lower extremity of pain from the hip to the ankle/toes.  Doppler ultrasound negative for DVT.  No apparent trauma or aggravating factor per history patient gives. ? ?Patient's description of symptoms consistent with lumbar radiculopathy, but MRI lumbar spine negative any left-sided pathology (showed mild multilevel degenerative changes with mild right foraminal stenosis at L5-S1 and mild right subarticular recess stenosis at L4-L5.  No other significant canal or foraminal stenosis) ? ?-- Orthopedic surgery for further input 3/23 ? ?MRI Left Hip obtained which shows: ?--Diffuse intramuscular edema involving the hip and pelvic musculature suggestive of polymyositis or potentially rhabdomyolysis. Correlate with creatine kinase. ?--Significant edema and fluid surrounding the left sciatic nerve potentially resulting in sciatic nerve irritation. ?--Nonspecific subcutaneous edema noted along the lower anterior abdominal wall and along the hips. ? ?-- With renal insufficiency, unable to try gabapentin and patient declines due to  side effects ?-- Continue  Tylenol and/or Norco as needed ?-- Continue PT/OT, mobilize as much as tolerated ?-- Expect gradual improvement over few weeks with supportive care ? ?Right ureteral calculus ?Status post ureteral stent placement by urology on 3/18.  Management as outlined per urology.  Foley removed.  Continue Flomax. ? ?Acute UTI ?Urine culture with no growth. ?On Keflex through 3/24. ? ?Hypoglycemia ?Likely due to renal insufficiency and use of insulin.  Levemir dose has been reduced. ?-- Hypoglycemia protocol, CBGs ? ?Dyslipidemia ?Continue statin ? ? ? ? ?  ? ?Subjective: Patient awake resting in bed when seen this morning.  She still having left hip and leg pain but it is slightly improved.  Still has a lot of pain with any movement.  Denies any other acute complaints.  No acute events reported. ? ?Physical Exam: ?Vitals:  ? 03/13/22 0751 03/13/22 2024 03/14/22 0311 03/14/22 0707  ?BP: (!) 143/65 (!) 163/72 (!) 151/71 (!) 160/67  ?Pulse: 78 85 81 79  ?Resp:  15 20 18   ?Temp: 98.5 ?F (36.9 ?C) 99.1 ?F (37.3 ?C) 98.6 ?F (37 ?C) 98.9 ?F (37.2 ?C)  ?TempSrc:   Oral   ?SpO2: 98% 98% 97% 98%  ?Weight:      ?Height:      ? ?General exam: awake, alert, no acute distress ?HEENT: atraumatic, clear conjunctiva, anicteric sclera, moist mucus membranes, hearing grossly normal  ?Respiratory system: CTAB, no wheezes, rales or rhonchi, normal respiratory effort. ?Cardiovascular system: normal S1/S2, RRR   ?Gastrointestinal system: soft, NT, ND, no HSM felt, +bowel sounds. ?Central nervous system: A&O x3. no gross focal neurologic deficits, normal speech ?Extremities: no gross deformities, no edema ?Skin: dry, intact, normal temperature, no rashes seen on visualized skin ?Psychiatry: normal mood, congruent affect, judgement and insight appear normal ? ? ?Data Reviewed: ? ?Labs reviewed and notable for chloride 116, bicarb normalized to 22, glucose 124, BUN 39, creatinine 2.73, calcium 8.2, albumin 2.0, GFR 17, CK normal at 57, hemoglobin 7.4,  platelets 415k ? ?Family Communication: None, will attempt to call ? ?Disposition: ?Status is: Inpatient ?Remains inpatient appropriate because: Ongoing evaluation of the left leg and hip pain, monitoring for further improvement in renal function still well above baseline.  Anticipate discharge in 24 to 48 hours if further clinical improvement. ? ? Planned Discharge Destination: Home with Home Health (patient declined SNF) ? ? ? ?Time spent: 45 minutes ? ?Author: ?Ezekiel Slocumb, DO ?03/14/2022 2:47 PM ? ?For on call review [email or web address].  ?

## 2022-03-14 NOTE — Care Management Important Message (Signed)
Important Message ? ?Patient Details  ?Name: Dana [name] ?MRN: [id number] ?Date of Birth: 15-Feb-1946 ? ? ?Medicare Important Message Given:  Yes ? ? ? ? ?[name] ?03/14/2022, 12:41 PM ?

## 2022-03-14 NOTE — Progress Notes (Signed)
MRI of the left hip was obtained.  Imaging reviewed.  Per radiology report: ? ?"IMPRESSION: ?Diffuse intramuscular edema involving the hip and pelvic musculature ?suggestive of polymyositis or potentially rhabdomyolysis. Correlate ?with creatine kinase. ?  ?Significant edema and fluid surrounding the left sciatic nerve ?potentially resulting in sciatic nerve irritation. ?  ?Nonspecific subcutaneous edema noted along the lower anterior ?abdominal wall and along the hips. ?  ?No evidence of acute fracture. Mild left hip osteoarthritis with ?degenerative anterior superior labral tearing." ? ?These findings of intramuscular edema and fluid about the sciatic nerve likely explain her current symptoms. ? ?Patient states that she continues to make symptomatic improvement.  She was able to bear weight today for the first time since onset of symptoms. ?  ?On exam, I was able to range her hip to 50 degrees hip flexion and her knee to approximately 60 degrees knee flexion without significant pain.  She still has moderate pain with hip internal and external rotation. ? ?Plan: ?1.  We discussed that these findings would likely resolve over time.  It is encouraging that she is having symptomatic improvement with increased ability to bear weight with decreased pain. ? ?2.  May start gabapentin and/or NSAIDs/prednisone taper as able to tolerate and if not medically contraindicated as these may help with symptomatic relief. ? ?3.  PT/OT as able. ? ?4.  We will plan to follow peripherally this weekend. Please page with any questions. May follow up with [place] an outpatient if needed. We did discuss that it may take a few weeks to recover function.  ? ?

## 2022-03-14 NOTE — TOC Progression Note (Signed)
Transition of Care (TOC) - Progression Note  ? ? ?Patient Details  ?Name: Richell Corker ?MRN: [id number] ?Date of Birth: 05-04-1946 ? ?Transition of Care (TOC) CM/SW Contact  ?Beverly Sessions, RN ?Phone Number: ?03/14/2022, 12:55 PM ? ?Clinical Narrative:    ? ?Referral for [facility] and [facility] made to Zack with [employer]. To be delivered to room ? ? ?Expected Discharge Plan: Home/Self Care ?Barriers to Discharge: Continued Medical Work up ? ?Expected Discharge Plan and Services ?Expected Discharge Plan: Home/Self Care ?  ?  ?Post Acute Care Choice: Home Health ?Living arrangements for the past 2 months: [place] ?                ?DME Arranged: Walker rolling, 3-N-1 ?DME Agency: [employer] ?  ?  ?  ?  ?  ?  ?  ?  ? ? ?Social Determinants of Health (SDOH) Interventions ?  ? ?Readmission Risk Interventions ? ?  03/09/2022  ? 11:45 AM  ?Readmission Risk Prevention Plan  ?Transportation Screening Complete  ?PCP or Specialist Appt within 3-5 Days Complete  ?[place] or Home Care Consult Complete  ?Social Work Consult for [place] Planning/Counseling Complete  ?Palliative Care Screening Not Applicable  ?Medication Review Press photographer) Complete  ? ? ?

## 2022-03-14 NOTE — Progress Notes (Signed)
?[facility]  ?PROGRESS NOTE  ? ?Subjective:  ? ?Patient resting in bed ?Alert and oriented ?Appetite remains poor ?Left leg pain remains ?No urine output recorded, but patient states she voids as usual ? ?Intake/Output Summary (Last 24 hours) at 03/14/2022 1116 ?Last data filed at 03/14/2022 1038 ?Gross per 24 hour  ?Intake 1708.67 ml  ?Output --  ?Net 1708.67 ml  ? ?  ? ?Lab Results  ?Component Value Date  ? CREATININE 2.73 (H) 03/14/2022  ? CREATININE 2.87 (H) 03/13/2022  ? CREATININE 3.36 (H) 03/12/2022  ?  ? ?Objective:  ?Vital signs: ?Blood pressure (!) 160/67, pulse 79, temperature 98.9 ?F (37.2 ?C), resp. rate 18, height 5\' 3"  (1.6 m), weight 68 kg, SpO2 98 %. ? ?Intake/Output Summary (Last 24 hours) at 03/14/2022 1116 ?Last data filed at 03/14/2022 1038 ?Gross per 24 hour  ?Intake 1708.67 ml  ?Output --  ?Net 1708.67 ml  ? ? ?Filed Weights  ? 03/08/22 1137  ?Weight: 68 kg  ? ? ? ?Physical Exam: ?General:  No acute distress  ?Head:  Normocephalic, atraumatic. Moist oral mucosal membranes  ?Eyes:  Anicteric  ?Lungs:   Clear to auscultation, normal effort  ?Heart:  S1S2 no rubs  ?Abdomen:   Soft, nontender, bowel sounds present  ?Extremities:  No peripheral edema.  ?Neurologic:  Awake, alert, following commands  ?Skin:  No lesions  ?   ? ? ?Basic Metabolic Panel: ?Recent Labs  ?Lab 03/10/22 ?0402 03/11/22 ?4259 03/12/22 ?0407 03/13/22 ?5638 03/14/22 ?0330  ?NA 138 137 137 138 141  ?K 3.4* 4.0 4.0 4.1 4.2  ?CL 113* 114* 114* 113* 116*  ?CO2 19* 16* 17* 16* 22  ?GLUCOSE 50* 109* 100* 88 124*  ?BUN 64* 59* 54* 45* 39*  ?CREATININE 4.66* 3.72* 3.36* 2.87* 2.73*  ?CALCIUM 7.7* 7.6* 8.0* 8.2* 8.2*  ? ? ? ?CBC: ?Recent Labs  ?Lab 03/10/22 ?0402 03/11/22 ?7564 03/12/22 ?0407 03/13/22 ?3329 03/14/22 ?0330  ?WBC 8.6 7.1 7.0 7.4 6.3  ?NEUTROABS 5.8 4.6 4.2  --   --   ?HGB 8.3* 7.2* 7.5* 7.1* 7.4*  ?HCT 25.9* 22.6* 23.5* 22.4* 23.6*  ?MCV 90.2 90.4 88.3 90.0 90.4  ?PLT 329 330 371 382 415*   ? ? ? ? ?Urinalysis: ?No results for input(s): COLORURINE, LABSPEC, [place], GLUCOSEU, HGBUR, BILIRUBINUR, KETONESUR, PROTEINUR, UROBILINOGEN, NITRITE, LEUKOCYTESUR in the last 72 hours. ? ?Invalid input(s): APPERANCEUR ?  ? ? ?Imaging: ?MR LUMBAR SPINE WO CONTRAST ? ?Result Date: 03/12/2022 ?CLINICAL DATA:  Lumbar radiculopathy, no red flags, no prior management EXAM: MRI LUMBAR SPINE WITHOUT CONTRAST TECHNIQUE: Multiplanar, multisequence MR imaging of the lumbar spine was performed. No intravenous contrast was administered. COMPARISON:  CT abdomen/pelvis March 18, 23. FINDINGS: Segmentation: Standard segmentation is assumed with the inferior-most fully formed intervertebral disc labeled L5-S1. Alignment: Straightening the normal lumbar lordosis. No substantial sagittal subluxation. Vertebrae: Mild degenerative/discogenic endplate signal changes at L5-S1. Otherwise, no focal marrow edema to suggest acute fracture or discitis/osteomyelitis. No suspicious bone lesions. Conus medullaris and cauda equina: Conus extends to the L1 level. Conus appears normal. Paraspinal and other soft tissues: Unremarkable. Disc levels: T12-L1: No significant disc protrusion, foraminal stenosis, or canal stenosis. L1-L2: No significant disc protrusion, foraminal stenosis, or canal stenosis. L2-L3: No significant disc protrusion, foraminal stenosis, or canal stenosis. L3-L4: Mild disc bulging without significant canal or foraminal stenosis. L4-L5: Mild disc bulging and mild bilateral facet arthropathy. No significant canal or foraminal stenosis. Mild subarticular recess narrowing on the right. L5-S1: Right  eccentric degenerative disc height loss with associated mild endplate signal changes. Small broad disc bulge, eccentric to the right with bilateral facet arthropathy. Resulting mild right foraminal stenosis without significant canal or left foraminal stenosis. IMPRESSION: Mild multilevel degenerative change (detailed above), including  mild right foraminal stenosis at L5-S1 and mild right subarticular recess stenosis at L4-L5. Otherwise, no significant canal or foraminal stenosis. Electronically Signed   By: Margaretha Sheffield M.D.   On: 03/12/2022 11:55  ? ?MR HIP LEFT WO CONTRAST ? ?Result Date: 03/13/2022 ?CLINICAL DATA:  Hip pain, stress fracture suspected, neg xray L hip pain EXAM: MR OF THE LEFT HIP WITHOUT CONTRAST TECHNIQUE: Multiplanar, multisequence MR imaging was performed. No intravenous contrast was administered. COMPARISON:  CT abdomen pelvis 03/08/2022 FINDINGS: Bones: There is no evidence of acute fracture, dislocation or avascular necrosis. No focal bone lesion. Mild degenerative changes of the pubic symphysis. Degenerative changes of the lower lumbar spine, see separately dictated recent lumbar spine MRI. Articular cartilage and labrum Articular cartilage:  Mild chondrosis. Labrum: Degenerative anterior superior labral tearing. Joint or bursal effusion Joint effusion: Small, left greater than right hip effusions. Bursae: Mild trochanteric bursal fluid bilaterally. Muscles and tendons Muscles and tendons: The gluteal tendons are intact. The proximal hamstring tendons demonstrate mild tendinosis.There is diffuse intramuscular edema involving the hip and pelvic musculature. Other findings Miscellaneous: Subcutaneous edema noted along the hips bilaterally and lower anterior abdominal wall. There is significant edema and fluid along the left sciatic nerve. IMPRESSION: Diffuse intramuscular edema involving the hip and pelvic musculature suggestive of polymyositis or potentially rhabdomyolysis. Correlate with creatine kinase. Significant edema and fluid surrounding the left sciatic nerve potentially resulting in sciatic nerve irritation. Nonspecific subcutaneous edema noted along the lower anterior abdominal wall and along the hips. No evidence of acute fracture. Mild left hip osteoarthritis with degenerative anterior superior labral  tearing. Electronically Signed   By: Maurine Simmering M.D.   On: 03/13/2022 22:19   ? ? ?Medications:  ? ? ? ? amLODipine  10 mg Oral Daily  ? cephALEXin  250 mg Oral Q24H  ? heparin injection (subcutaneous)  5,000 Units Subcutaneous Q8H  ? insulin detemir  10 Units Subcutaneous Daily  ? irbesartan  75 mg Oral Daily  ?  morphine injection  2 mg Intravenous Once  ? omega-3 acid ethyl esters  1 g Oral Daily  ? sodium bicarbonate  650 mg Oral BID  ? tamsulosin  0.4 mg Oral Daily  ? ? ?Assessment/ Plan:  ?   ?Principal Problem: ?  AKI (acute kidney injury) ([place]) ?Active Problems: ?  DM (diabetes mellitus), type 1 ([place]) ?  Hypertension ?  Dyslipidemia ?  Acute unilateral obstructive uropathy ?  Hypoglycemia ?  Acute UTI ?  Right ureteral calculus ?  Left leg pain ?  Metabolic acidosis ?  Generalized weakness ? ?76 y.o. female with a PMHx of hypertension, coronary artery disease, diabetes, peripheral vascular disease, chronic kidney disease now comes back to the emergency room after discharge 3 days ago.  She was initially presented to the emergency room with flank pain associated with nausea and vomiting.  She was found to have right-sided nephrolithiasis with moderate hydronephrosis.  ?  ?#1: Acute kidney injury on chronic kidney disease st 4: Patient has CKD stage IV with baseline creatinine 1.83 and GFR 28. . The acute kidney injury is most likely secondary to severe prerenal azotemia complicated by the use of ACE inhibitor's and diuretics.   ?Creatinine improving daily. Will  d/c IVF today. Patient encouraged to maintain oral intake. Will schedule follow up appt with our office at discharge. ?  ?#2:  Acute metabolic acidosis: The metabolic acidosis possibly can be due to acute kidney injury complicated by sepsis. ? Bicarb corrected with sodium bicarb tablets.  ?  ?#3: Nephrolithiasis/hydronephrosis/sepsis: Patient is now s/p urological procedure and placement of right ureteral stent.  ? Continue Tamsulosin.   ? ?#4:  Hypertension:   ? Blood pressure 160/67 ? Amlodipine and irbesartan prescribed ?  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   ?Primary team to manage SSI. ?  ?#6: Hyponatremia:   ? Resolved ?  ?#7: Anemia: Anemia most

## 2022-03-14 NOTE — Progress Notes (Signed)
Mobility Specialist - Progress Note ? ? 03/14/22 1100  ?Mobility  ?Activity Refused mobility  ? ? ? ?Pt with PT upon arrival. Will attempt at another date and time. ? ?Dana [name] ?Mobility Specialist ?03/14/22, 11:24 AM ? ? ? ? ?

## 2022-03-15 DIAGNOSIS — N179 Acute kidney failure, unspecified: Secondary | ICD-10-CM | POA: Diagnosis not present

## 2022-03-15 LAB — BASIC METABOLIC PANEL
Anion gap: 7 (ref 5–15)
BUN: 37 mg/dL — ABNORMAL HIGH (ref 8–23)
CO2: 22 mmol/L (ref 22–32)
Calcium: 8.2 mg/dL — ABNORMAL LOW (ref 8.9–10.3)
Chloride: 111 mmol/L (ref 98–111)
Creatinine, Ser: 2.49 mg/dL — ABNORMAL HIGH (ref 0.44–1.00)
GFR, Estimated: 20 mL/min — ABNORMAL LOW (ref >60)
Glucose, Bld: 101 mg/dL — ABNORMAL HIGH (ref 70–99)
Potassium: 4.1 mmol/L (ref 3.5–5.1)
Sodium: 140 mmol/L (ref 135–145)

## 2022-03-15 LAB — C-REACTIVE PROTEIN: CRP: 8 mg/dL — ABNORMAL HIGH (ref <1.0)

## 2022-03-15 LAB — MAGNESIUM: Magnesium: 1.6 mg/dL — ABNORMAL LOW (ref 1.7–2.4)

## 2022-03-15 MED ORDER — HYOSCYAMINE SULFATE 0.125 MG SL SUBL: 0.1250 mg | SUBLINGUAL_TABLET | SUBLINGUAL | 0 refills | Status: DC | PRN

## 2022-03-15 MED ORDER — LEVEMIR FLEXTOUCH 100 UNIT/ML ~~LOC~~ SOPN: 10.0000 [IU] | PEN_INJECTOR | Freq: Every day | SUBCUTANEOUS | 0 refills | Status: DC

## 2022-03-15 MED ORDER — MAGNESIUM SULFATE 2 GM/50ML IV SOLN
2.0000 g | Freq: Once | INTRAVENOUS | Status: AC
  Administered 2022-03-15: 2 g via INTRAVENOUS
  Filled 2022-03-15: qty 50

## 2022-03-15 MED ORDER — IRBESARTAN 150 MG PO TABS
150.0000 mg | ORAL_TABLET | Freq: Every day | ORAL | Status: DC
  Administered 2022-03-15: 150 mg via ORAL
  Filled 2022-03-15: qty 1

## 2022-03-15 MED ORDER — TAMSULOSIN HCL 0.4 MG PO CAPS
0.4000 mg | ORAL_CAPSULE | Freq: Every day | ORAL | 0 refills | Status: AC
Start: 2022-03-15 — End: 2022-03-22

## 2022-03-15 MED ORDER — ONDANSETRON HCL 4 MG PO TABS: 4.0000 mg | ORAL_TABLET | Freq: Four times a day (QID) | ORAL | 0 refills | Status: DC | PRN

## 2022-03-15 MED ORDER — SODIUM BICARBONATE 650 MG PO TABS: 650.0000 mg | ORAL_TABLET | Freq: Two times a day (BID) | ORAL | 0 refills | Status: AC

## 2022-03-15 NOTE — Plan of Care (Signed)

## 2022-03-15 NOTE — Progress Notes (Signed)
?[facility]  ?PROGRESS NOTE  ? ?Subjective:  ? ?Patient resting in bed ?Alert and oriented ?Appetite remains poor ?Serum creatinine has further improved to 2.5 today. ? ?Intake/Output Summary (Last 24 hours) at 03/15/2022 1042 ?Last data filed at 03/15/2022 1028 ?Gross per 24 hour  ?Intake 180 ml  ?Output 0 ml  ?Net 180 ml  ? ?  ? ?Lab Results  ?Component Value Date  ? CREATININE 2.49 (H) 03/15/2022  ? CREATININE 2.73 (H) 03/14/2022  ? CREATININE 2.87 (H) 03/13/2022  ?  ? ?Objective:  ?Vital signs: ?Blood pressure (!) 162/78, pulse 81, temperature 98.4 ?F (36.9 ?C), temperature source Oral, resp. rate 19, height 5\' 3"  (1.6 m), weight 68 kg, SpO2 98 %. ? ?Intake/Output Summary (Last 24 hours) at 03/15/2022 1042 ?Last data filed at 03/15/2022 1028 ?Gross per 24 hour  ?Intake 180 ml  ?Output 0 ml  ?Net 180 ml  ? ? ?Filed Weights  ? 03/08/22 1137  ?Weight: 68 kg  ? ? ? ?Physical Exam: ?General:  No acute distress  ?Head:  Normocephalic, atraumatic. Moist oral mucosal membranes  ?Eyes:  Anicteric  ?Lungs:   Clear to auscultation, normal effort  ?Heart:  S1S2 no rubs  ?Abdomen:   Soft, nontender, bowel sounds present  ?Extremities:  No peripheral edema.  ?Neurologic:  Awake, alert, following commands  ?Skin:  No lesions  ?   ? ? ?Basic Metabolic Panel: ?Recent Labs  ?Lab 03/11/22 ?5361 03/12/22 ?0407 03/13/22 ?4431 03/14/22 ?0330 03/15/22 ?0454  ?NA 137 137 138 141 140  ?K 4.0 4.0 4.1 4.2 4.1  ?CL 114* 114* 113* 116* 111  ?CO2 16* 17* 16* 22 22  ?GLUCOSE 109* 100* 88 124* 101*  ?BUN 59* 54* 45* 39* 37*  ?CREATININE 3.72* 3.36* 2.87* 2.73* 2.49*  ?CALCIUM 7.6* 8.0* 8.2* 8.2* 8.2*  ?MG  --   --   --   --  1.6*  ? ? ? ?CBC: ?Recent Labs  ?Lab 03/10/22 ?0402 03/11/22 ?5400 03/12/22 ?0407 03/13/22 ?8676 03/14/22 ?0330  ?WBC 8.6 7.1 7.0 7.4 6.3  ?NEUTROABS 5.8 4.6 4.2  --   --   ?HGB 8.3* 7.2* 7.5* 7.1* 7.4*  ?HCT 25.9* 22.6* 23.5* 22.4* 23.6*  ?MCV 90.2 90.4 88.3 90.0 90.4  ?PLT 329 330 371 382 415*   ? ? ? ? ?Urinalysis: ?No results for input(s): COLORURINE, LABSPEC, [place], GLUCOSEU, HGBUR, BILIRUBINUR, KETONESUR, PROTEINUR, UROBILINOGEN, NITRITE, LEUKOCYTESUR in the last 72 hours. ? ?Invalid input(s): APPERANCEUR ?  ? ? ?Imaging: ?MR HIP LEFT WO CONTRAST ? ?Result Date: 03/13/2022 ?CLINICAL DATA:  Hip pain, stress fracture suspected, neg xray L hip pain EXAM: MR OF THE LEFT HIP WITHOUT CONTRAST TECHNIQUE: Multiplanar, multisequence MR imaging was performed. No intravenous contrast was administered. COMPARISON:  CT abdomen pelvis 03/08/2022 FINDINGS: Bones: There is no evidence of acute fracture, dislocation or avascular necrosis. No focal bone lesion. Mild degenerative changes of the pubic symphysis. Degenerative changes of the lower lumbar spine, see separately dictated recent lumbar spine MRI. Articular cartilage and labrum Articular cartilage:  Mild chondrosis. Labrum: Degenerative anterior superior labral tearing. Joint or bursal effusion Joint effusion: Small, left greater than right hip effusions. Bursae: Mild trochanteric bursal fluid bilaterally. Muscles and tendons Muscles and tendons: The gluteal tendons are intact. The proximal hamstring tendons demonstrate mild tendinosis.There is diffuse intramuscular edema involving the hip and pelvic musculature. Other findings Miscellaneous: Subcutaneous edema noted along the hips bilaterally and lower anterior abdominal wall. There is significant edema and fluid along  the left sciatic nerve. IMPRESSION: Diffuse intramuscular edema involving the hip and pelvic musculature suggestive of polymyositis or potentially rhabdomyolysis. Correlate with creatine kinase. Significant edema and fluid surrounding the left sciatic nerve potentially resulting in sciatic nerve irritation. Nonspecific subcutaneous edema noted along the lower anterior abdominal wall and along the hips. No evidence of acute fracture. Mild left hip osteoarthritis with degenerative anterior  superior labral tearing. Electronically Signed   By: Maurine Simmering M.D.   On: 03/13/2022 22:19   ? ? ?Medications:  ? ? ? ? amLODipine  10 mg Oral Daily  ? heparin injection (subcutaneous)  5,000 Units Subcutaneous Q8H  ? insulin detemir  10 Units Subcutaneous Daily  ? irbesartan  150 mg Oral Daily  ?  morphine injection  2 mg Intravenous Once  ? omega-3 acid ethyl esters  1 g Oral Daily  ? sodium bicarbonate  650 mg Oral BID  ? tamsulosin  0.4 mg Oral Daily  ? ? ?Assessment/ Plan:  ?   ?Principal Problem: ?  AKI (acute kidney injury) ([place]) ?Active Problems: ?  DM (diabetes mellitus), type 1 ([place]) ?  Hypertension ?  Dyslipidemia ?  Acute unilateral obstructive uropathy ?  Hypoglycemia ?  Acute UTI ?  Right ureteral calculus ?  Left leg pain ?  Metabolic acidosis ?  Generalized weakness ?  Hypomagnesemia ? ?76 y.o. female with a PMHx of hypertension, coronary artery disease, diabetes, peripheral vascular disease, chronic kidney disease now comes back to the emergency room after discharge 3 days ago.  She was initially presented to the emergency room with flank pain associated with nausea and vomiting.  She was found to have right-sided nephrolithiasis with moderate hydronephrosis.  ?  ?#1: Acute kidney injury on chronic kidney disease st 4: Patient has CKD stage IV with baseline creatinine 1.83 and GFR 28. . The acute kidney injury is most likely secondary to severe prerenal azotemia complicated by the use of ACE inhibitor's and diuretics.   ?Creatinine improving daily.  ?We will schedule follow-up as outpatient ?  ?#2:  Acute metabolic acidosis: The metabolic acidosis possibly can be due to acute kidney injury complicated by sepsis. ? Bicarb corrected with sodium bicarb tablets.  ?  ?#3: Nephrolithiasis/hydronephrosis/sepsis: Patient is now s/p urological procedure and placement of right ureteral stent.  ? Continue Tamsulosin.  ? Also received oral Keflex for 5 days which is completed.  ? ?#4: Hypertension:   ? Blood  pressure 300'P systolic ? Amlodipine and irbesartan prescribed ? Will consider increasing dose of irbesartan as outpatient ?  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   ?Primary team to manage SSI. ?  ?#6: Hyponatremia:   ? Resolved ?  ?#7: Anemia: Anemia most likely secondary to chronic kidney disease. ? Hgb currently 7.4.   ? If it does not improve, will need outpatient hematology evaluation for ESA therapy ?  ? ? LOS: 7 ?Murlean Iba, NP ?[facility] ?3/25/202310:42 AM ? ?

## 2022-03-15 NOTE — Discharge Summary (Addendum)
?Physician Discharge Summary ?  ?Patient: Dana [name] MRN: [id number] DOB: [birth year]  ?Admit date:     03/08/2022  ?Discharge date: 03/15/2022  ?Discharge Physician: Ezekiel Slocumb  ? ?PCP: Gladstone Lighter, MD  ? ?Recommendations at discharge:  ? ?Follow-up with primary care in 1 to 2 weeks ?Repeat BMP, Mg, CBC in 1 to 2 weeks ?Follow-up with nephrology as scheduled ?Follow-up on resolution of left lower extremity pain over the next few weeks ?Follow-up with urology as scheduled ? ?Discharge Diagnoses: ?Principal Problem: ?  AKI (acute kidney injury) ([place]) ?Active Problems: ?  Acute unilateral obstructive uropathy ?  DM (diabetes mellitus), type 1 ([place]) ?  Hypertension ?  Dyslipidemia ?  Hypoglycemia ?  Acute UTI ?  Right ureteral calculus ?  Left leg pain ?  Metabolic acidosis ?  Generalized weakness ?  Hypomagnesemia ? ? ?Hospital Course: ?Dana [name] is a 76 y.o. female with medical history significant for type 1 diabetes mellitus, hypertension, CKD stage IV, who presented to the ED on 3/14 with right lower quadrant abdominal pain, nausea and vomiting.  She was found to have a 5 mm right proximal ureteral calculus with mild hydronephrosis/hydroureter.  She was treated with analgesics and was discharged home from the ED on hydrocodone, Flomax and Zofran.  She presented again on 03/08/2022 with ongoing nausea, vomiting and worsening right lower quadrant pain radiating to the right flank.  Repeat CT abdomen and pelvis showed persistent 5 mm right proximal ureteral stone with moderate right hydronephrosis.  She was also found to have AKI, likely postobstructive uropathy. ?  ?She was treated with IV fluids and empiric IV antibiotics. Urology was consulted, patient underwent right ureteroscopy and right ureteral stent placement.  10 mL of purulent urine was aspirated from the renal pelvis during the procedure. ? ?Patient developed acute onset of left hip and lower extremity pain from her hip to ankle that was  not present on admission.  Extensive evaluation as outlined below. ? ?Assessment and Plan: ?* AKI (acute kidney injury) ([place]) ?Presented with creatinine of 5.81.  Has baseline CKD stage IV, baseline creatinine about 1.8.  AKI felt likely to prerenal azotemia with continuation of ACE inhibitor and diuretic in addition to obstructive uropathy. ?Renal function slowly improving. ?-- Nephrology following ?-- Renal function continues to improve off IV fluids  ?-- Encourage PO hydration ?-- Repeat BMP at follow-up ? ?Acute unilateral obstructive uropathy ?Status post right ureteral stent placement with urology on 3/18. ? ? ? ?DM (diabetes mellitus), type 1 ([place]) ?With renal insufficiency, patient has had recurrent hypoglycemic episodes. ?Levemir reduced 60>>30>>10 units daily. ?Discharged on lower dose of Levemir to prevent hypoglycemia.  Close PCP follow-up to adjust insulin as needed ? ?Hypertension ?Continue amlodipine.  ARB resumed, dose increased as BPs have been mildly elevated. ?Close primary care follow-up. ? ?Hypomagnesemia ?Magnesium was replaced. ?Repeat Mg level at follow-up and replace if needed. ? ?Generalized weakness ?PT evaluated, SNF/rehab recommended. ?TOC following.  Patient declined SNF but agreeable to home health. ?--Mobilize as much as tolerated ?-- Home health PT arranged ?-- Rolling walker bedside commode provided ? ?Metabolic acidosis ?Mildly elevated anion gap 13 on admission in the setting of acute renal failure.  Gap now closed, with hyperchloremia having been on normal saline since admission.   ?Renal function improving. ?-- Nephrology following ?-- Started on bicarb tablets ?-- Monitor BMP ?-- Off IV fluids and stable ? ?Left leg pain ?Patient has an onset of severe left lower extremity of pain from  the hip to the ankle/toes after admission.  She can typically walk for extended periods of time, usually 30 minutes or more daily.  Mobility now severely limited due to pain. ?Doppler ultrasound  negative for DVT.  No apparent trauma or aggravating factor per history patient gives. ? ?Patient's description of symptoms consistent with lumbar radiculopathy, but MRI lumbar spine negative any left-sided pathology (showed mild multilevel degenerative changes with mild right foraminal stenosis at L5-S1 and mild right subarticular recess stenosis at L4-L5.  No other significant canal or foraminal stenosis) ? ?-- Orthopedic surgery for further input 3/23 ? ?MRI Left Hip obtained which shows: ?--Diffuse intramuscular edema involving the hip and pelvic musculature suggestive of polymyositis or potentially rhabdomyolysis. Correlate with creatine kinase. ?--Significant edema and fluid surrounding the left sciatic nerve potentially resulting in sciatic nerve irritation. ?--Nonspecific subcutaneous edema noted along the lower anterior abdominal wall and along the hips. ? ?-- With renal insufficiency, unable to try gabapentin and patient declines due to side effects ?-- Continue Tylenol and/or Norco as needed ?-- Continue PT/OT, mobilize as much as tolerated ?-- Expect gradual improvement over few weeks with supportive care ? ?Right ureteral calculus ?Status post ureteral stent placement by urology on 3/18.  Management as outlined per urology.  Foley removed.  Continue Flomax. ?Follow-up with urology outpatient as instructed ? ?Acute UTI ?Urine culture with no growth. ?Completed course with Keflex through 3/24. ? ?Hypoglycemia ?Resolved. ?Likely due to renal insufficiency and use of insulin.  Levemir dose has been reduced as outlined. ? ? ?Dyslipidemia ?Continue statin ? ? ? ? ?  ? ? ?Consultants: Nephrology, orthopedic surgery ?Procedures performed: None ?Disposition: Home health ? ?Diet recommendation:  ?Discharge Diet Orders (From admission, onward)  ? ?  Start     Ordered  ? 03/15/22 0000  Diet - low sodium heart healthy       ? 03/15/22 1127  ? ?  ?  ? ?  ? ?Cardiac and Carb modified diet ?DISCHARGE  MEDICATION: ?Allergies as of 03/15/2022   ? ?   Reactions  ? Clindamycin Dermatitis, Rash  ? Clindamycin/lincomycin Rash  ? Chocolate   ? Nosebleed  ? Chocolate Flavor   ? Other reaction(s): Unknown ?Nosebleed  ? Food Color Red   ? Other reaction(s): Unknown ?Other reaction(s): Unknown  ? Food Color Yellow   ? Other reaction(s): Unknown ?Other reaction(s): Unknown  ? Aspirin Rash  ? Other reaction(s): Unknown: Document details in comments ?Pt states her PCP told her she was allergic to ASA.  Pt does not remember having a reaction.  ?Pt states her PCP told her she was allergic to ASA.  Pt does not remember having a reaction.   ? ?  ? ?  ?Medication List  ?  ? ?STOP taking these medications   ? ?HYDROcodone-acetaminophen 5-325 MG tablet ?Commonly known as: Norco ?  ?ondansetron 8 MG disintegrating tablet ?Commonly known as: ZOFRAN-ODT ?  ? ?  ? ?TAKE these medications   ? ?acetaminophen 500 MG tablet ?Commonly known as: TYLENOL ?Take 500 mg by mouth every 6 (six) hours as needed for mild pain. ?  ?amLODipine 10 MG tablet ?Commonly known as: NORVASC ?TAKE 1 TABLET BY MOUTH EVERY DAY ?  ?aspirin 81 MG EC tablet ?Take 81 mg by mouth See admin instructions. Takes on Tues and Thur only due to history of eye bleed. ?  ?B-complex with vitamin C tablet ?Take 1 tablet by mouth daily. ?  ?FISH OIL PO ?Take 1 capsule  by mouth daily. ?  ?hyoscyamine 0.125 MG SL tablet ?Commonly known as: LEVSIN SL ?Place 1 tablet (0.125 mg total) under the tongue every 4 (four) hours as needed for cramping (Bladder spasm). ?  ?Jardiance 10 MG Tabs tablet ?Generic drug: empagliflozin ?Take 10 mg by mouth daily. ?  ?Levemir FlexTouch 100 UNIT/ML FlexPen ?Generic drug: insulin detemir ?Inject 10 Units into the skin daily. ?What changed: how much to take ?  ?ondansetron 4 MG tablet ?Commonly known as: ZOFRAN ?Take 1 tablet (4 mg total) by mouth every 6 (six) hours as needed for nausea. ?  ?prednisoLONE acetate 1 % ophthalmic suspension ?Commonly known  as: PRED FORTE ?Place 1 drop into the left eye 4 (four) times daily. ?  ?rosuvastatin 10 MG tablet ?Commonly known as: CRESTOR ?Take 10 mg by mouth daily. ?  ?sodium bicarbonate 650 MG tablet ?Take 1 tablet (650 mg tot

## 2022-03-15 NOTE — Plan of Care (Signed)
  Problem: Activity: Goal: Risk for activity intolerance will decrease Outcome: Progressing   Problem: Nutrition: Goal: Adequate nutrition will be maintained Outcome: Progressing   Problem: Coping: Goal: Level of anxiety will decrease Outcome: Progressing   Problem: Safety: Goal: Ability to remain free from injury will improve Outcome: Progressing   Problem: Skin Integrity: Goal: Risk for impaired skin integrity will decrease Outcome: Progressing   

## 2022-03-15 NOTE — TOC Transition Note (Addendum)
Transition of Care (TOC) - CM/SW Discharge Note ? ? ?Patient Details  ?Name: Dana [name] ?MRN: [id number] ?Date of Birth: [birth year] ? ?Transition of Care (TOC) CM/SW Contact:  ?Makennah Omura E Gianfranco Araki, LCSW ?Phone Number: ?03/15/2022, 10:26 AM ? ? ?Clinical Narrative:    ?Patient to DC home today. ?Notified Cory with [facility].  ?Asked RN to verify RW and BSC have been delivered to bedside.  ?11:33- Checked with Jasmine with [employer] due to DME not being delivered. Jasmine stated [employer] has spoken to patient who elected to purchase her DME from a local store due to her insurance being out of network.  ? ? ?Final next level of care: [place] ?Barriers to Discharge: Barriers Resolved ? ? ?Patient Goals and CMS Choice ?Patient states their goals for this hospitalization and ongoing recovery are:: home with husband ?CMS Medicare.gov Compare Post Acute Care list provided to:: Patient ?Choice offered to / list presented to : Patient ? ?Discharge Placement ?  ?           ?  ?  ?  ?  ? ?Discharge Plan and Services ?  ?  ?Post Acute Care Choice: Home Health          ?DME Arranged: [place] rolling, 3-N-1 ?DME Agency: [employer] ?  ?  ?  ?HH Arranged: PT, OT, Social Work ?[place] Agency: [facility] ?Date HH Agency Contacted: 03/15/22 ?  ?Representative spoke with at [place]: Tommi Rumps ? ?Social Determinants of Health (SDOH) Interventions ?  ? ? ?Readmission Risk Interventions ? ?  03/09/2022  ? 11:45 AM  ?Readmission Risk Prevention Plan  ?Transportation Screening Complete  ?PCP or Specialist Appt within 3-5 Days Complete  ?[place] or Home Care Consult Complete  ?Social Work Consult for [place] Planning/Counseling Complete  ?Palliative Care Screening Not Applicable  ?Medication Review Press photographer) Complete  ? ? ? ? ? ?

## 2022-03-15 NOTE — Plan of Care (Signed)
?  Problem: Education: ?Goal: Knowledge of General Education information will improve ?Description: Including pain rating scale, medication(s)/side effects and non-pharmacologic comfort measures ?Outcome: Adequate for Discharge ?  ?Problem: Health Behavior/Discharge Planning: ?Goal: Ability to manage health-related needs will improve ?Outcome: Adequate for Discharge ?  ?Problem: Clinical Measurements: ?Goal: Ability to maintain clinical measurements within normal limits will improve ?Outcome: Adequate for Discharge ?Goal: Will remain free from infection ?Outcome: Adequate for Discharge ?Goal: Diagnostic test results will improve ?Outcome: Adequate for Discharge ?Goal: Respiratory complications will improve ?Outcome: Adequate for Discharge ?Goal: Cardiovascular complication will be avoided ?Outcome: Adequate for Discharge ?  ?Problem: Activity: ?Goal: Risk for activity intolerance will decrease ?03/15/2022 1147 by Evangeline Dakin, RN ?Outcome: Adequate for Discharge ?03/15/2022 1147 by Evangeline Dakin, RN ?Outcome: Progressing ?  ?Problem: Nutrition: ?Goal: Adequate nutrition will be maintained ?03/15/2022 1147 by Evangeline Dakin, RN ?Outcome: Adequate for Discharge ?03/15/2022 1147 by Evangeline Dakin, RN ?Outcome: Progressing ?  ?Problem: Coping: ?Goal: Level of anxiety will decrease ?03/15/2022 1147 by Evangeline Dakin, RN ?Outcome: Adequate for Discharge ?03/15/2022 1147 by Evangeline Dakin, RN ?Outcome: Progressing ?  ?Problem: Elimination: ?Goal: Will not experience complications related to bowel motility ?Outcome: Adequate for Discharge ?Goal: Will not experience complications related to urinary retention ?Outcome: Adequate for Discharge ?  ?Problem: Pain Managment: ?Goal: General experience of comfort will improve ?Outcome: Adequate for Discharge ?  ?Problem: Safety: ?Goal: Ability to remain free from injury will improve ?03/15/2022 1147 by Evangeline Dakin, RN ?Outcome: Adequate for Discharge ?03/15/2022 1147 by  Evangeline Dakin, RN ?Outcome: Progressing ?  ?Problem: Skin Integrity: ?Goal: Risk for impaired skin integrity will decrease ?03/15/2022 1147 by Evangeline Dakin, RN ?Outcome: Adequate for Discharge ?03/15/2022 1147 by Evangeline Dakin, RN ?Outcome: Progressing ?  ?

## 2022-03-16 NOTE — Assessment & Plan Note (Signed)
Magnesium was replaced. ?Repeat Mg level at follow-up and replace if needed. ?

## 2022-03-17 LAB — GLUCOSE, CAPILLARY
Glucose-Capillary: 101 mg/dL — ABNORMAL HIGH (ref 70–99)
Glucose-Capillary: 84 mg/dL (ref 70–99)
Glucose-Capillary: 97 mg/dL (ref 70–99)
Glucose-Capillary: 116 mg/dL — ABNORMAL HIGH (ref 70–99)

## 2022-03-17 LAB — ENA+DNA/DS+ANTICH+CENTRO+JO...
Anti JO-1: <0.2 AI (ref 0.0–0.9)
Centromere Ab Screen: >8 AI — ABNORMAL HIGH (ref 0.0–0.9)
Chromatin Ab SerPl-aCnc: 0.2 AI (ref 0.0–0.9)
ENA SM Ab Ser-aCnc: <0.2 AI (ref 0.0–0.9)
Ribonucleic Protein: <0.2 AI (ref 0.0–0.9)
SSA (Ro) (ENA) Antibody, IgG: <0.2 AI (ref 0.0–0.9)
SSB (La) (ENA) Antibody, IgG: <0.2 AI (ref 0.0–0.9)
Scleroderma (Scl-70) (ENA) Antibody, IgG: <0.2 AI (ref 0.0–0.9)
ds DNA Ab: <1 IU/mL (ref 0–9)

## 2022-03-17 LAB — ANA W/REFLEX IF POSITIVE: Anti Nuclear Antibody (ANA): POSITIVE — AB

## 2022-03-19 DIAGNOSIS — E1121 Type 2 diabetes mellitus with diabetic nephropathy: Secondary | ICD-10-CM | POA: Diagnosis not present

## 2022-03-19 DIAGNOSIS — Z794 Long term (current) use of insulin: Secondary | ICD-10-CM | POA: Diagnosis not present

## 2022-03-19 DIAGNOSIS — N1832 Chronic kidney disease, stage 3b: Secondary | ICD-10-CM | POA: Diagnosis not present

## 2022-03-19 DIAGNOSIS — Z09 Encounter for follow-up examination after completed treatment for conditions other than malignant neoplasm: Secondary | ICD-10-CM | POA: Diagnosis not present

## 2022-03-19 DIAGNOSIS — I10 Essential (primary) hypertension: Secondary | ICD-10-CM | POA: Diagnosis not present

## 2022-03-25 DIAGNOSIS — N2581 Secondary hyperparathyroidism of renal origin: Secondary | ICD-10-CM | POA: Diagnosis not present

## 2022-03-25 DIAGNOSIS — N200 Calculus of kidney: Secondary | ICD-10-CM | POA: Diagnosis not present

## 2022-03-25 DIAGNOSIS — D631 Anemia in chronic kidney disease: Secondary | ICD-10-CM | POA: Diagnosis not present

## 2022-03-25 DIAGNOSIS — E1122 Type 2 diabetes mellitus with diabetic chronic kidney disease: Secondary | ICD-10-CM | POA: Diagnosis not present

## 2022-03-25 DIAGNOSIS — R809 Proteinuria, unspecified: Secondary | ICD-10-CM | POA: Diagnosis not present

## 2022-03-25 DIAGNOSIS — N179 Acute kidney failure, unspecified: Secondary | ICD-10-CM | POA: Diagnosis not present

## 2022-03-25 DIAGNOSIS — N184 Chronic kidney disease, stage 4 (severe): Secondary | ICD-10-CM | POA: Diagnosis not present

## 2022-03-30 ENCOUNTER — Emergency Department
Admission: EM | Admit: 2022-03-30 | Discharge: 2022-03-30 | Disposition: A | Discharge status: Home / Self Care | Payer: No Typology Code available for payment source | Attending: Emergency Medicine | Admitting: Emergency Medicine

## 2022-03-30 ENCOUNTER — Other Ambulatory Visit: Payer: Self-pay

## 2022-03-30 ENCOUNTER — Emergency Department: Payer: No Typology Code available for payment source

## 2022-03-30 DIAGNOSIS — N3000 Acute cystitis without hematuria: Secondary | ICD-10-CM | POA: Diagnosis not present

## 2022-03-30 DIAGNOSIS — R42 Dizziness and giddiness: Secondary | ICD-10-CM | POA: Insufficient documentation

## 2022-03-30 DIAGNOSIS — I10 Essential (primary) hypertension: Secondary | ICD-10-CM | POA: Diagnosis not present

## 2022-03-30 DIAGNOSIS — E109 Type 1 diabetes mellitus without complications: Secondary | ICD-10-CM | POA: Diagnosis not present

## 2022-03-30 DIAGNOSIS — R109 Unspecified abdominal pain: Secondary | ICD-10-CM | POA: Diagnosis not present

## 2022-03-30 LAB — CBC
HCT: 27.2 % — ABNORMAL LOW (ref 36.0–46.0)
Hemoglobin: 8.3 g/dL — ABNORMAL LOW (ref 12.0–15.0)
MCH: 28.5 pg (ref 26.0–34.0)
MCHC: 30.5 g/dL (ref 30.0–36.0)
MCV: 93.5 fL (ref 80.0–100.0)
Platelets: 352 10*3/uL (ref 150–400)
RBC: 2.91 MIL/uL — ABNORMAL LOW (ref 3.87–5.11)
RDW: 12.8 % (ref 11.5–15.5)
WBC: 8.8 10*3/uL (ref 4.0–10.5)
nRBC: 0 % (ref 0.0–0.2)

## 2022-03-30 LAB — URINALYSIS, ROUTINE W REFLEX MICROSCOPIC
Bilirubin Urine: NEGATIVE
Glucose, UA: 150 mg/dL — AB
Ketones, ur: NEGATIVE mg/dL
Nitrite: NEGATIVE
Protein, ur: >=300 mg/dL — AB
Specific Gravity, Urine: 1.012 (ref 1.005–1.030)
pH: 6 (ref 5.0–8.0)

## 2022-03-30 LAB — COMPREHENSIVE METABOLIC PANEL
ALT: 11 U/L (ref 0–44)
AST: 14 U/L — ABNORMAL LOW (ref 15–41)
Albumin: 2.9 g/dL — ABNORMAL LOW (ref 3.5–5.0)
Alkaline Phosphatase: 82 U/L (ref 38–126)
Anion gap: 7 (ref 5–15)
BUN: 37 mg/dL — ABNORMAL HIGH (ref 8–23)
CO2: 25 mmol/L (ref 22–32)
Calcium: 8.5 mg/dL — ABNORMAL LOW (ref 8.9–10.3)
Chloride: 106 mmol/L (ref 98–111)
Creatinine, Ser: 2.66 mg/dL — ABNORMAL HIGH (ref 0.44–1.00)
GFR, Estimated: 18 mL/min — ABNORMAL LOW (ref >60)
Glucose, Bld: 138 mg/dL — ABNORMAL HIGH (ref 70–99)
Potassium: 3.9 mmol/L (ref 3.5–5.1)
Sodium: 138 mmol/L (ref 135–145)
Total Bilirubin: 0.5 mg/dL (ref 0.3–1.2)
Total Protein: 7.3 g/dL (ref 6.5–8.1)

## 2022-03-30 LAB — LIPASE, BLOOD: Lipase: 21 U/L (ref 11–51)

## 2022-03-30 LAB — TROPONIN I (HIGH SENSITIVITY)
Troponin I (High Sensitivity): 18 ng/L — ABNORMAL HIGH (ref <18)
Troponin I (High Sensitivity): 21 ng/L — ABNORMAL HIGH (ref <18)

## 2022-03-30 MED ORDER — SODIUM CHLORIDE 0.9 % IV BOLUS
500.0000 mL | Freq: Once | INTRAVENOUS | Status: AC
  Administered 2022-03-30: 500 mL via INTRAVENOUS

## 2022-03-30 MED ORDER — CEPHALEXIN 500 MG PO CAPS: 500.0000 mg | ORAL_CAPSULE | Freq: Four times a day (QID) | ORAL | 0 refills | Status: AC

## 2022-03-30 NOTE — ED Notes (Signed)
IV team at bedside 

## 2022-03-30 NOTE — Discharge Instructions (Signed)
Take Keflex four times daily for seven days.  ?

## 2022-03-30 NOTE — ED Notes (Signed)
Patient back from CT.

## 2022-03-30 NOTE — ED Triage Notes (Signed)
Pt here with abd pain, dizziness, and nausea that started about an hour ago. Pt states abd pain is centered and does not radiate. Pt denies diarrhea.  ?

## 2022-03-30 NOTE — ED Provider Notes (Signed)
? ?[facility] ?Provider Note ? ?Patient Contact: 3:34 PM (approximate) ? ? ?History  ? ?Abdominal Pain and Dizziness ? ? ?HPI ? ?Dana [name] is a 76 y.o. female with a history of type 1 diabetes and hypertension presents to the emergency department after an episode of intense nausea and dizziness that occurred while patient was at church.  Patient was admitted on 03/08/2022 for obstructive uropathy and AKI.  Patient had right cystoscopy with ureteral stent placement by Dr. Bernardo Heater on 03/08/2022.  Patient states that she has had appropriate follow-up and has felt well since being discharged.  Patient reports that her symptoms today started with dizziness while standing at church.  She reports that she has been compliant with her diabetes medications and ate this morning.  She states that she sat down for a moment after experiencing dizziness and then was overwhelmed with nausea.  She endorses some periumbilical abdominal discomfort that seems to come and go.  She states that she currently has mild nausea but her pain is since subsided.  She denies experiencing similar pain in the past.  No recent dysuria or increased urinary frequency.  She denies fever or chills at home. ? ?  ? ? ?Physical Exam  ? ?Triage Vital Signs: ?ED Triage Vitals  ?Enc Vitals Group  ?   BP 03/30/22 1515 136/69  ?   Pulse Rate 03/30/22 1515 73  ?   Resp 03/30/22 1515 18  ?   Temp 03/30/22 1515 97.7 ?F (36.5 ?C)  ?   Temp Source 03/30/22 1515 Oral  ?   SpO2 03/30/22 1515 92 %  ?   Weight 03/30/22 1513 150 lb (68 kg)  ?   Height 03/30/22 1513 5\' 3"  (1.6 m)  ?   Head Circumference --   ?   Peak Flow --   ?   Pain Score 03/30/22 1513 7  ?   Pain Loc --   ?   Pain Edu? --   ?   Excl. in [place]? --   ? ? ?Most recent vital signs: ?Vitals:  ? 03/30/22 1515 03/30/22 1948  ?BP: 136/69 (!) 171/75  ?Pulse: 73 77  ?Resp: 18 16  ?Temp: 97.7 ?F (36.5 ?C) 98.3 ?F (36.8 ?C)  ?SpO2: 92% 93%  ? ? ? ?General: Alert and in no acute  distress. ?Eyes:  PERRL. EOMI. ?Head: No acute traumatic findings ?ENT: ?     Ears:  ?     Nose: No congestion/rhinnorhea. ?     Mouth/Throat: Mucous membranes are moist.  ?Neck: No stridor. No cervical spine tenderness to palpation. ?Hematological/Lymphatic/Immunilogical: No cervical lymphadenopathy. ?Cardiovascular:  Good peripheral perfusion ?Respiratory: Normal respiratory effort without tachypnea or retractions. Lungs CTAB. Good air entry to the bases with no decreased or absent breath sounds. ?Gastrointestinal: Bowel sounds ?4 quadrants. Soft and nontender to palpation. No guarding or rigidity. No palpable masses. No distention. No CVA tenderness. ?Musculoskeletal: Full range of motion to all extremities.  ?Neurologic:  No gross focal neurologic deficits are appreciated.  ?Skin:   No rash noted ?Other: ? ? ?ED Results / Procedures / Treatments  ? ?Labs ?(all labs ordered are listed, but only abnormal results are displayed) ?Labs Reviewed  ?COMPREHENSIVE METABOLIC PANEL - Abnormal; Notable for the following components:  ?    Result Value  ? Glucose, Bld 138 (*)   ? BUN 37 (*)   ? Creatinine, Ser 2.66 (*)   ? Calcium 8.5 (*)   ? Albumin 2.9 (*)   ?  AST 14 (*)   ? GFR, Estimated 18 (*)   ? All other components within normal limits  ?CBC - Abnormal; Notable for the following components:  ? RBC 2.91 (*)   ? Hemoglobin 8.3 (*)   ? HCT 27.2 (*)   ? All other components within normal limits  ?URINALYSIS, ROUTINE W REFLEX MICROSCOPIC - Abnormal; Notable for the following components:  ? Color, Urine YELLOW (*)   ? APPearance HAZY (*)   ? Glucose, UA 150 (*)   ? Hgb urine dipstick SMALL (*)   ? Protein, ur >=300 (*)   ? Leukocytes,Ua SMALL (*)   ? Bacteria, UA MANY (*)   ? All other components within normal limits  ?TROPONIN I (HIGH SENSITIVITY) - Abnormal; Notable for the following components:  ? Troponin I (High Sensitivity) 18 (*)   ? All other components within normal limits  ?TROPONIN I (HIGH SENSITIVITY) -  Abnormal; Notable for the following components:  ? Troponin I (High Sensitivity) 21 (*)   ? All other components within normal limits  ?LIPASE, BLOOD  ? ? ? ?EKG ? ?Normal sinus rhythm without ST segment elevation or other apparent arrhythmia. ? ? ?RADIOLOGY ? ?I personally viewed and evaluated these images as part of my medical decision making, as well as reviewing the written report by the radiologist. ? ?ED Provider Interpretation: I personally reviewed CT abdomen and pelvis without contrast and agree with radiologist interpretation.  Stent is in expected position and patient has interval improvement in her right-sided hydronephrosis. ? ? ?PROCEDURES: ? ?Critical Care performed: No ? ?Procedures ? ? ?MEDICATIONS ORDERED IN ED: ?Medications  ?sodium chloride 0.9 % bolus 500 mL (500 mLs Intravenous New Bag/Given 03/30/22 1923)  ? ? ? ?IMPRESSION / MDM / ASSESSMENT AND PLAN / ED COURSE  ?I reviewed the triage vital signs and the nursing notes. ?             ?               ? ?Assessment and plan ?Dizziness ?76 year old female with history of diabetes and hypertension with recent admission for obstructive uropathy and AKI presents to the emergency department with dizziness and periumbilical abdominal pain ? ?Vital signs are reassuring at triage.  On physical exam, patient was alert and nontoxic-appearing.  Her abdomen was soft and nontender without guarding.  She had no CVA tenderness. ? ?We will obtain basic labs including troponin.  Will obtain EKG and will reassess. ? ?Delta troponin 18 and 21 respectively.  EKG indicated normal sinus rhythm without ST segment elevation or other apparent arrhythmia.  CMP largely consistent with patient's baseline labs.  Urinalysis indicated many bacteria, small amount of leuks and a small amount of blood.  Patient is not currently taking any antibiotic for urinary tract infection.  We will start patient on Keflex 4 times daily for the next 7 days.  She has a follow-up appointment  with urology in the morning.  Upon recheck, patient stated that her dizziness and nausea had resolved.  She declined a prescription for nausea medicine stating that she had nausea medicine at home.  Patient states that she has several family members that live close by and has easy access to the emergency department should her symptoms change or worsen.  She was cautioned to return to the emergency department if she experiences worsening abdominal pain, chest pain, chest tightness, shortness of breath or fever. ? ?  ? ? ?FINAL CLINICAL IMPRESSION(S) / ED DIAGNOSES  ? ?  Final diagnoses:  ?Acute cystitis without hematuria  ? ? ? ?Rx / DC Orders  ? ?ED Discharge Orders   ? ?      Ordered  ?  cephALEXin (KEFLEX) 500 MG capsule  4 times daily       ? 03/30/22 2223  ? ?  ?  ? ?  ? ? ? ?Note:  This document was prepared using Dragon voice recognition software and may include unintentional dictation errors. ?  ?Lannie Fields, PA-C ?03/30/22 2228 ? ?  ?Rada Hay, MD ?03/31/22 2342 ? ?

## 2022-03-31 ENCOUNTER — Telehealth: Payer: Self-pay | Admitting: Urology

## 2022-03-31 ENCOUNTER — Ambulatory Visit: Payer: Self-pay

## 2022-03-31 NOTE — ED Notes (Signed)
Pt returned today with #22g in the RFA from her visit yesterday, catheter removed , intact, bleeding controlled. ?

## 2022-03-31 NOTE — Telephone Encounter (Signed)
PT LMOM about having stent removed.  I didn't see anything in OP note about this.  Can you please confirm? ?

## 2022-03-31 NOTE — Telephone Encounter (Signed)
?  IV left in arm from ED visit 03/30/22. Instructed to return to ED for removal.  ? ?Answer Assessment - Initial Assessment Questions ?1. SYMPTOM:  "What's the main symptom you're concerned about?" (e.g., pain, redness, swelling, pus) ?    None ?2. ONSET: "When did the   start?" ?    03/30/22 ?3. IV TYPE: "What kind of IV line do you have?" (e.g., central line, PICC, peripheral IV) ?    Peripheral ?4. IV LOCATION - SITE: "Where does the IV enter your body?" ?    Right arm ?5. IV START DATE: "When was this IV put in?" ?    03/30/22 ?6. IV REASON: "Why do you have this IV line?" ?    Was in ED ?7. IV FUNCTION: "Describe how the IV is running?" (e.g., running normally, running slowly, not running, unable to flush)  ?    Not running ?8. PAIN: "Is there any pain?" If Yes, ask: "How bad is the pain?" (e.g., scale 1-10; or mild, moderate, severe) "Describe the pain." (e.g., burning, throbbing, shooting, sharp, etc.) ?  - NONE (0): no pain ?  - MILD (1-3): doesn't interfere with normal activities  ?  - MODERATE (4-7): interferes with normal activities or awakens from sleep  ?  - SEVERE (8-10): excruciating pain, unable to do any normal activities  ?    None ?9. SWELLING: "Is there any swelling at your IV site?"  ?    No ?10. FEVER: "Do you have a fever?" If Yes, ask: "What is your temperature, how was it measured, and when did it start?"  ?      No ?11. OTHER SYMPTOMS: "Do you have any other symptoms?" (e.g., shaking chills, weakness) ?      No ?12. VISITING NURSE: "Do you have a visiting nurse?" (e.g., home health nurse, IV infusion nurse) ?      No ?13. PUMP: "Is it on a pump?" If Yes,, ask: "Is there an alarm and what is the message?" ?      No ? ?Protocols used: IV Site and Other Symptoms-A-AH ? ?

## 2022-04-01 NOTE — Telephone Encounter (Signed)
Patient called the office again today.  She is not sure if she needs an office visit to discuss next steps or if we are removing the stent in the office.  ? ?Please advise. ?

## 2022-04-02 ENCOUNTER — Other Ambulatory Visit: Payer: Self-pay | Admitting: Urology

## 2022-04-02 DIAGNOSIS — N201 Calculus of ureter: Secondary | ICD-10-CM

## 2022-04-02 NOTE — Telephone Encounter (Signed)
She only had a stent placed and the stone was not removed.  Needs to be scheduled for ureteroscopy.  I will send a scheduling sheet to [facility]. ?

## 2022-04-02 NOTE — Telephone Encounter (Signed)
Patient is coming in on Friday.  

## 2022-04-02 NOTE — Progress Notes (Signed)
Surgical Physician Order Form [facility]  ? ?* Scheduling expectation : Next Available ? ?*Length of Case: 45 minutes ? ?*Clearance needed: no ? ?*Anticoagulation Instructions: N/A ? ?*Aspirin Instructions: Ok to continue Aspirin ? ?*Post-op visit Date/Instructions:  1 week cysto stent removal ? ?*Diagnosis: Right Ureteral Stone ? ?*Procedure: Right  Ureteroscopy w/laser lithotripsy & stent exchange (25189) ? ? ?Additional orders: N/A ? ?-Admit type: OUTpatient ? ?-Anesthesia: Choice ? ?-VTE Prophylaxis Standing Order SCD?s    ?   ?Other:  ? ?-Standing Lab Orders Per Anesthesia   ? ?Lab other: UA&Urine Culture ? ?-Standing Test orders EKG/Chest x-ray per Anesthesia      ? ?Test other:  ? ?- Medications:  Gentamicin per pharmacy ? ?-Other orders:   Schedule preop visit with me to discuss surgery ? ? ? ?  ? ?

## 2022-04-03 ENCOUNTER — Telehealth: Payer: Self-pay

## 2022-04-03 NOTE — Progress Notes (Signed)
[facility] Posting Form  ? ?Surgery Date/Time: Date: 04/08/2022 ? ?Surgeon: Dr. John Giovanni, MD ? ?Surgery Location: Day Surgery ? ?Inpt ( No  )   Outpt (Yes)   Obs ( No  )  ? ?Diagnosis: Right Ureteral Stone ? ?-CPT: 30746 ? ?Surgery: Right Ureteroscopy with laser lithotripsy and stent exchange ? ?Stop Anticoagulations: No, may continue ASA ? ?Cardiac/Medical/Pulmonary Clearance needed: no  ? ?*Orders entered into EPIC  Date: 04/03/22  ? ?*Case booked in [place]  Date: 04/02/22 ? ?*Notified pt of Surgery: Date: 04/02/2022 ? ?PRE-OP UA & CX: Yes, will obtain tomorrow 04/14 ? ?*Placed into Prior Authorization Work Que Date: 04/03/22 ? ? ?Assistant/laser/rep:No ? ? ? ? ? ? ? ? ? ? ? ? ? ? ? ?

## 2022-04-03 NOTE — Telephone Encounter (Signed)
I spoke with Mrs. Dana [name]. We have discussed possible surgery dates and Tuesday April 18th, 2023 was agreed upon by all parties. Patient given information about surgery date, what to expect pre-operatively and post operatively.  ?We discussed that a Pre-Admission Testing office will be calling to set up the pre-op visit that will take place prior to surgery, and that these appointments are typically done over the phone with a Pre-Admissions RN.  ?Informed patient that our office will communicate any additional care to be provided after surgery. Patients questions or concerns were discussed during our call. Advised to call our office should there be any additional information, questions or concerns that arise. Patient verbalized understanding.  ? ?

## 2022-04-04 ENCOUNTER — Other Ambulatory Visit: Payer: Self-pay

## 2022-04-04 ENCOUNTER — Other Ambulatory Visit
Admission: RE | Admit: 2022-04-04 | Discharge: 2022-04-04 | Disposition: A | Discharge status: Home / Self Care | Payer: PPO | Source: Ambulatory Visit | Attending: Urology | Admitting: Urology

## 2022-04-04 ENCOUNTER — Ambulatory Visit (INDEPENDENT_AMBULATORY_CARE_PROVIDER_SITE_OTHER): Payer: No Typology Code available for payment source | Admitting: Urology

## 2022-04-04 ENCOUNTER — Encounter: Payer: Self-pay | Admitting: Urology

## 2022-04-04 VITALS — BP 115/78 | HR 72 | Ht 67.0 in | Wt 150.0 lb

## 2022-04-04 DIAGNOSIS — N201 Calculus of ureter: Secondary | ICD-10-CM | POA: Diagnosis not present

## 2022-04-04 HISTORY — DX: Anemia, unspecified: D64.9

## 2022-04-04 HISTORY — DX: Personal history of urinary calculi: Z87.442

## 2022-04-04 HISTORY — DX: Chronic kidney disease, unspecified: N18.9

## 2022-04-04 HISTORY — DX: Peripheral vascular disease, unspecified: I73.9

## 2022-04-04 HISTORY — DX: Unspecified osteoarthritis, unspecified site: M19.90

## 2022-04-04 NOTE — Progress Notes (Signed)
? ?04/04/2022 ?9:56 AM  ? ?Dana [name] ?Jul 15, 1946 ?[id number] ? ?Referring provider: Gladstone Lighter, MD ?[address] ?[place],  [place] [address] ? ?Chief Complaint  ?Patient presents with  ? Pre-op Exam  ? ? ?HPI: ?76 y.o. female presents for preop visit. ? ?Right ureteral stent placed 03/08/2022 for a 5 mm right proximal ureteral calculus with leukocytosis, AKI with creatinine 5.8.  Stone was impacted.  Intraoperative urine culture from right renal pelvis showed no growth ?Scheduled for ureteroscopy/definitive stone treatment next week ?ED visit 03/30/2022 for abdominal pain and dizziness ?Started on cephalexin for UTI.  She is feeling better with resolution of symptoms. ?Urine culture was not ordered ?CT was performed and stent in good position ? ? ?PMH: ?Past Medical History:  ?Diagnosis Date  ? DM (diabetes mellitus), type 1 ([place])   ? Hypertension   ? ? ?Surgical History: ?Past Surgical History:  ?Procedure Laterality Date  ? CYSTOSCOPY WITH STENT PLACEMENT Right 03/08/2022  ? Procedure: CYSTOSCOPY WITH RIGHT URETERALSTENT PLACEMENT;  Surgeon: Abbie Sons, MD;  Location: [facility] ORS;  Service: Urology;  Laterality: Right;  ? HYSTERECTOMY ABDOMINAL WITH SALPINGO-OOPHORECTOMY    ? URETEROSCOPY Right 03/08/2022  ? Procedure: URETEROSCOPY;  Surgeon: Abbie Sons, MD;  Location: [facility] ORS;  Service: Urology;  Laterality: Right;  ? ? ?Home Medications:  ?Allergies as of 04/04/2022   ? ?   Reactions  ? Clindamycin Dermatitis, Rash  ? Clindamycin/lincomycin Rash  ? Chocolate   ? Nosebleed  ? Chocolate Flavor   ? Nosebleed  ? Food Color Red   ? Other reaction(s): Unknown ?Other reaction(s): Unknown  ? Food Color Yellow   ? Other reaction(s): Unknown ?Other reaction(s): Unknown  ? Aspirin Rash  ? Other reaction(s): Unknown: Document details in comments ?Pt states her PCP told her she was allergic to ASA.  Pt does not remember having a reaction.  ?Pt states her PCP told her she was allergic to ASA.  Pt does not  remember having a reaction.   ? ?  ? ?  ?Medication List  ?  ? ?  ? Accurate as of April 04, 2022  9:56 AM. If you have any questions, ask your nurse or doctor.  ?  ?  ? ?  ? ?acetaminophen 500 MG tablet ?Commonly known as: TYLENOL ?Take 500 mg by mouth every 6 (six) hours as needed for mild pain. ?  ?amLODipine 10 MG tablet ?Commonly known as: NORVASC ?TAKE 1 TABLET BY MOUTH EVERY DAY ?  ?aspirin 81 MG EC tablet ?Take 81 mg by mouth See admin instructions. Takes on Tues and Thur only due to history of eye bleed. ?  ?B-complex with vitamin C tablet ?Take 1 tablet by mouth daily. ?  ?cephALEXin 500 MG capsule ?Commonly known as: KEFLEX ?Take 1 capsule (500 mg total) by mouth 4 (four) times daily for 7 days. ?  ?FISH OIL PO ?Take 1 capsule by mouth daily. ?  ?hydrochlorothiazide 25 MG tablet ?Commonly known as: HYDRODIURIL ?Take by mouth. ?  ?HYDROcodone-acetaminophen 5-325 MG tablet ?Commonly known as: NORCO/VICODIN ?every 8 (eight) hours if needed 4-8 hrs ?  ?hyoscyamine 0.125 MG SL tablet ?Commonly known as: LEVSIN SL ?Place 1 tablet (0.125 mg total) under the tongue every 4 (four) hours as needed for cramping (Bladder spasm). ?  ?Jardiance 10 MG Tabs tablet ?Generic drug: empagliflozin ?Take 10 mg by mouth daily. ?  ?Levemir FlexTouch 100 UNIT/ML FlexPen ?Generic drug: insulin detemir ?Inject 10 Units into the skin daily. ?  ?  ondansetron 4 MG tablet ?Commonly known as: ZOFRAN ?Take 1 tablet (4 mg total) by mouth every 6 (six) hours as needed for nausea. ?  ?ondansetron 8 MG disintegrating tablet ?Commonly known as: ZOFRAN-ODT ?Take by mouth. ?  ?prednisoLONE acetate 1 % ophthalmic suspension ?Commonly known as: PRED FORTE ?Place 1 drop into the left eye 4 (four) times daily. ?  ?rosuvastatin 10 MG tablet ?Commonly known as: CRESTOR ?Take 10 mg by mouth daily. ?  ?sodium bicarbonate 650 MG tablet ?Take 1 tablet (650 mg total) by mouth 2 (two) times daily. ?  ?tamsulosin 0.4 MG Caps capsule ?Commonly known as:  FLOMAX ?  ?telmisartan-hydrochlorothiazide 40-12.5 MG tablet ?Commonly known as: MICARDIS HCT ?Take 1 tablet by mouth daily. ?  ?Turmeric 500 MG Tabs ?Take 500 mg by mouth daily at 6 (six) AM. ?  ?vitamin C 500 MG tablet ?Commonly known as: ASCORBIC ACID ?Take 500 mg by mouth 2 (two) times daily. ?  ?VITAMIN D PO ?Take 2 tablets by mouth daily. ?  ? ?  ? ? ?Allergies:  ?Allergies  ?Allergen Reactions  ? Clindamycin Dermatitis and Rash  ? Clindamycin/Lincomycin Rash  ? Chocolate   ?  Nosebleed  ? Chocolate Flavor   ?   ?Nosebleed  ? Food Color Red   ?  Other reaction(s): Unknown ?Other reaction(s): Unknown  ? Food Color Yellow   ?  Other reaction(s): Unknown ?Other reaction(s): Unknown  ? Aspirin Rash  ?  Other reaction(s): Unknown: Document details in comments ?Pt states her PCP told her she was allergic to ASA.  Pt does not remember having a reaction.  ?Pt states her PCP told her she was allergic to ASA.  Pt does not remember having a reaction.  ?  ? ? ?Family History: ?Family History  ?Problem Relation Age of Onset  ? Diabetes Mother   ? Hypertension Father   ? Diabetes Sister   ? Diabetes Brother   ? Diabetes Sister   ? Hypertension Sister   ? Diabetes Brother   ? Diabetes Brother   ? ? ?Social History:  reports that she has never smoked. She has never used smokeless tobacco. She reports that she does not drink alcohol and does not use drugs. ? ? ?Physical Exam: ?BP 115/78   Pulse 72   Ht 5\' 7"  (1.702 m)   Wt 150 lb (68 kg)   BMI 23.49 kg/m?   ?Constitutional:  Alert and oriented, No acute distress. ?HEENT: [place] AT, moist mucus membranes.  Trachea midline, no masses. ?Cardiovascular: RRR ?Respiratory: Clear ?GI: Abdomen is soft, nontender, nondistended, no abdominal masses ?GU: No CVA tenderness ?Skin: No rashes, bruises or suspicious lesions. ?Neurologic: Grossly intact, no focal deficits, moving all 4 extremities. ?Psychiatric: Normal mood and affect. ? ? ?Pertinent Imaging: ?CT performed 03/30/2022 was  personally reviewed and interpreted.  The right ureteral calculus is visualized lying alongside the stent in the proximal ureter. ? ? ?Assessment & Plan:   ? ?1. Right ureteral calculus ?Scheduled for right ureteroscopy definitive stone treatment 04/08/2022. ?The procedure was discussed in detail including potential risks of bleeding, infection/sepsis and ureteral injury.  The need for replacement of her stent postoperatively was discussed.  Alternative of SWL was discussed ?All questions were answered and she desires to proceed. ? ? ?Abbie Sons, MD ? ?[facility] ?[address] ?[place], [place] [address] ?[phone] ?

## 2022-04-04 NOTE — H&P (View-Only) (Signed)
? ?04/04/2022 ?9:56 AM  ? ?Dana [name] ?[birth year] ?[id number] ? ?Referring provider: Gladstone Lighter, MD ?[facility] ?[place],  [place] [address] ? ?Chief Complaint  ?Patient presents with  ? Pre-op Exam  ? ? ?HPI: ?76 y.o. female presents for preop visit. ? ?Right ureteral stent placed 03/08/2022 for a 5 mm right proximal ureteral calculus with leukocytosis, AKI with creatinine 5.8.  Stone was impacted.  Intraoperative urine culture from right renal pelvis showed no growth ?Scheduled for ureteroscopy/definitive stone treatment next week ?ED visit 03/30/2022 for abdominal pain and dizziness ?Started on cephalexin for UTI.  She is feeling better with resolution of symptoms. ?Urine culture was not ordered ?CT was performed and stent in good position ? ? ?PMH: ?Past Medical History:  ?Diagnosis Date  ? DM (diabetes mellitus), type 1 ([place])   ? Hypertension   ? ? ?Surgical History: ?Past Surgical History:  ?Procedure Laterality Date  ? CYSTOSCOPY WITH STENT PLACEMENT Right 03/08/2022  ? Procedure: CYSTOSCOPY WITH RIGHT URETERALSTENT PLACEMENT;  Surgeon: Abbie Sons, MD;  Location: [facility] ORS;  Service: Urology;  Laterality: Right;  ? HYSTERECTOMY ABDOMINAL WITH SALPINGO-OOPHORECTOMY    ? URETEROSCOPY Right 03/08/2022  ? Procedure: URETEROSCOPY;  Surgeon: Abbie Sons, MD;  Location: [facility] ORS;  Service: Urology;  Laterality: Right;  ? ? ?Home Medications:  ?Allergies as of 04/04/2022   ? ?   Reactions  ? Clindamycin Dermatitis, Rash  ? Clindamycin/lincomycin Rash  ? Chocolate   ? Nosebleed  ? Chocolate Flavor   ? Nosebleed  ? Food Color Red   ? Other reaction(s): Unknown ?Other reaction(s): Unknown  ? Food Color Yellow   ? Other reaction(s): Unknown ?Other reaction(s): Unknown  ? Aspirin Rash  ? Other reaction(s): Unknown: Document details in comments ?Pt states her PCP told her she was allergic to ASA.  Pt does not remember having a reaction.  ?Pt states her PCP told her she was allergic to ASA.  Pt does not  remember having a reaction.   ? ?  ? ?  ?Medication List  ?  ? ?  ? Accurate as of April 04, 2022  9:56 AM. If you have any questions, ask your nurse or doctor.  ?  ?  ? ?  ? ?acetaminophen 500 MG tablet ?Commonly known as: TYLENOL ?Take 500 mg by mouth every 6 (six) hours as needed for mild pain. ?  ?amLODipine 10 MG tablet ?Commonly known as: NORVASC ?TAKE 1 TABLET BY MOUTH EVERY DAY ?  ?aspirin 81 MG EC tablet ?Take 81 mg by mouth See admin instructions. Takes on Tues and Thur only due to history of eye bleed. ?  ?B-complex with vitamin C tablet ?Take 1 tablet by mouth daily. ?  ?cephALEXin 500 MG capsule ?Commonly known as: KEFLEX ?Take 1 capsule (500 mg total) by mouth 4 (four) times daily for 7 days. ?  ?FISH OIL PO ?Take 1 capsule by mouth daily. ?  ?hydrochlorothiazide 25 MG tablet ?Commonly known as: HYDRODIURIL ?Take by mouth. ?  ?HYDROcodone-acetaminophen 5-325 MG tablet ?Commonly known as: NORCO/VICODIN ?every 8 (eight) hours if needed 4-8 hrs ?  ?hyoscyamine 0.125 MG SL tablet ?Commonly known as: LEVSIN SL ?Place 1 tablet (0.125 mg total) under the tongue every 4 (four) hours as needed for cramping (Bladder spasm). ?  ?Jardiance 10 MG Tabs tablet ?Generic drug: empagliflozin ?Take 10 mg by mouth daily. ?  ?Levemir FlexTouch 100 UNIT/ML FlexPen ?Generic drug: insulin detemir ?Inject 10 Units into the skin daily. ?  ?  ondansetron 4 MG tablet ?Commonly known as: ZOFRAN ?Take 1 tablet (4 mg total) by mouth every 6 (six) hours as needed for nausea. ?  ?ondansetron 8 MG disintegrating tablet ?Commonly known as: ZOFRAN-ODT ?Take by mouth. ?  ?prednisoLONE acetate 1 % ophthalmic suspension ?Commonly known as: PRED FORTE ?Place 1 drop into the left eye 4 (four) times daily. ?  ?rosuvastatin 10 MG tablet ?Commonly known as: CRESTOR ?Take 10 mg by mouth daily. ?  ?sodium bicarbonate 650 MG tablet ?Take 1 tablet (650 mg total) by mouth 2 (two) times daily. ?  ?tamsulosin 0.4 MG Caps capsule ?Commonly known as:  FLOMAX ?  ?telmisartan-hydrochlorothiazide 40-12.5 MG tablet ?Commonly known as: MICARDIS HCT ?Take 1 tablet by mouth daily. ?  ?Turmeric 500 MG Tabs ?Take 500 mg by mouth daily at 6 (six) AM. ?  ?vitamin C 500 MG tablet ?Commonly known as: ASCORBIC ACID ?Take 500 mg by mouth 2 (two) times daily. ?  ?VITAMIN D PO ?Take 2 tablets by mouth daily. ?  ? ?  ? ? ?Allergies:  ?Allergies  ?Allergen Reactions  ? Clindamycin Dermatitis and Rash  ? Clindamycin/Lincomycin Rash  ? Chocolate   ?  Nosebleed  ? Chocolate Flavor   ?   ?Nosebleed  ? Food Color Red   ?  Other reaction(s): Unknown ?Other reaction(s): Unknown  ? Food Color Yellow   ?  Other reaction(s): Unknown ?Other reaction(s): Unknown  ? Aspirin Rash  ?  Other reaction(s): Unknown: Document details in comments ?Pt states her PCP told her she was allergic to ASA.  Pt does not remember having a reaction.  ?Pt states her PCP told her she was allergic to ASA.  Pt does not remember having a reaction.  ?  ? ? ?Family History: ?Family History  ?Problem Relation Age of Onset  ? Diabetes Mother   ? Hypertension Father   ? Diabetes Sister   ? Diabetes Brother   ? Diabetes Sister   ? Hypertension Sister   ? Diabetes Brother   ? Diabetes Brother   ? ? ?Social History:  reports that she has never smoked. She has never used smokeless tobacco. She reports that she does not drink alcohol and does not use drugs. ? ? ?Physical Exam: ?BP 115/78   Pulse 72   Ht 5\' 7"  (1.702 m)   Wt 150 lb (68 kg)   BMI 23.49 kg/m?   ?Constitutional:  Alert and oriented, No acute distress. ?HEENT: [place] AT, moist mucus membranes.  Trachea midline, no masses. ?Cardiovascular: RRR ?Respiratory: Clear ?GI: Abdomen is soft, nontender, nondistended, no abdominal masses ?GU: No CVA tenderness ?Skin: No rashes, bruises or suspicious lesions. ?Neurologic: Grossly intact, no focal deficits, moving all 4 extremities. ?Psychiatric: Normal mood and affect. ? ? ?Pertinent Imaging: ?CT performed 03/30/2022 was  personally reviewed and interpreted.  The right ureteral calculus is visualized lying alongside the stent in the proximal ureter. ? ? ?Assessment & Plan:   ? ?1. Right ureteral calculus ?Scheduled for right ureteroscopy definitive stone treatment 04/08/2022. ?The procedure was discussed in detail including potential risks of bleeding, infection/sepsis and ureteral injury.  The need for replacement of her stent postoperatively was discussed.  Alternative of SWL was discussed ?All questions were answered and she desires to proceed. ? ? ?Abbie Sons, MD ? ?[facility] ?[address], [address] ?[place], [place] [address] ?[phone] ? ?

## 2022-04-04 NOTE — Patient Instructions (Addendum)
Your procedure is scheduled on: 04/08/22 - Tuesday ?Report to the Registration Desk on the 1st floor of the [facility]. ?To find out your arrival time, please call [phone] between 1PM - 3PM on: 04/07/22 - Monday ? ?REMEMBER: ?Instructions that are not followed completely may result in serious medical risk, up to and including death; or upon the discretion of your surgeon and anesthesiologist your surgery may need to be rescheduled. ? ?Do not eat food or drink any fluids after midnight the night before surgery.  ?No gum chewing, lozengers or hard candies. ? ? ?TAKE THESE MEDICATIONS THE MORNING OF SURGERY WITH A SIP OF WATER: ? ?- sodium bicarbonate 650 MG tablet ?- amLODipine (NORVASC) 10 MG tablet ? ? ?- insulin detemir (LEVEMIR FLEXTOUCH) 100 UNIT/ML FlexPen - Only inject 1/2 of your prescribed dose of Insulin if taken at bedtime the night before your surgery. ? ?- Do not take any Insulin the morning of your procedure. ? ?- JARDIANCE 10 MG TABS tablet stop taking on 04/05/22, may resume taking the day after surgery. ? ?Follow recommendations from Cardiologist, Pulmonologist or PCP regarding stopping Aspirin, Coumadin, Plavix, Eliquis, Pradaxa, or Pletal. May take as directed by your doctor, but do not take the day of your surgery. ? ?One week prior to surgery: ?Stop Anti-inflammatories (NSAIDS) such as Advil, Aleve, Ibuprofen, Motrin, Naproxen, Naprosyn and Aspirin based products such as Excedrin, Goodys Powder, BC Powder. ? ?Stop ANY OVER THE COUNTER supplements until after surgery.Omega-3 Fatty Acids, Turmeric, Vit C. Vitamin D, B Complex-C ? ?You may however, continue to take Tylenol if needed for pain up until the day of surgery. ? ?No Alcohol for 24 hours before or after surgery. ? ?No Smoking including e-cigarettes for 24 hours prior to surgery.  ?No chewable tobacco products for at least 6 hours prior to surgery.  ?No nicotine patches on the day of surgery. ? ?Do not use any "recreational" drugs  for at least a week prior to your surgery.  ?Please be advised that the combination of cocaine and anesthesia may have negative outcomes, up to and including death. ?If you test positive for cocaine, your surgery will be cancelled. ? ?On the morning of surgery brush your teeth with toothpaste and water, you may rinse your mouth with mouthwash if you wish. ?Do not swallow any toothpaste or mouthwash. ? ?Do not wear jewelry, make-up, hairpins, clips or nail polish. ? ?Do not wear lotions, powders, or perfumes.  ? ?Do not shave body from the neck down 48 hours prior to surgery just in case you cut yourself which could leave a site for infection.  ?Also, freshly shaved skin may become irritated if using the CHG soap. ? ?Contact lenses, hearing aids and dentures may not be worn into surgery. ? ?Do not bring valuables to the hospital. [facility] is not responsible for any missing/lost belongings or valuables.  ? ?Notify your doctor if there is any change in your medical condition (cold, fever, infection). ? ?Wear comfortable clothing (specific to your surgery type) to the hospital. ? ?After surgery, you can help prevent lung complications by doing breathing exercises.  ?Take deep breaths and cough every 1-2 hours. Your doctor may order a device called an Incentive Spirometer to help you take deep breaths. ?When coughing or sneezing, hold a pillow firmly against your incision with both hands. This is called ?splinting.? Doing this helps protect your incision. It also decreases belly discomfort. ? ?If you are being admitted to the hospital overnight,  leave your suitcase in the car. ?After surgery it may be brought to your room. ? ?If you are being discharged the day of surgery, you will not be allowed to drive home. ?You will need a responsible adult (18 years or older) to drive you home and stay with you that night.  ? ?If you are taking public transportation, you will need to have a responsible adult (18 years or older)  with you. ?Please confirm with your physician that it is acceptable to use public transportation.  ? ?Please call the [facility] Dept. at [phone] if you have any questions about these instructions. ? ?Surgery Visitation Policy: ? ?Patients undergoing a surgery or procedure may have two family members or support persons with them as long as the person is not COVID-19 positive or experiencing its symptoms.  ? ?Inpatient Visitation:   ? ?Visiting hours are 7 a.m. to 8 p.m. ?Up to four visitors are allowed at one time in a patient room, including children. The visitors may rotate out with other people during the day. One designated support person (adult) may remain overnight.  ?

## 2022-04-06 ENCOUNTER — Encounter: Payer: Self-pay | Admitting: Urology

## 2022-04-08 ENCOUNTER — Encounter: Payer: Self-pay | Admitting: Urology

## 2022-04-08 ENCOUNTER — Other Ambulatory Visit: Payer: Self-pay

## 2022-04-08 ENCOUNTER — Ambulatory Visit: Payer: No Typology Code available for payment source | Admitting: Physician Assistant

## 2022-04-08 ENCOUNTER — Encounter
Admission: RE | Disposition: A | Discharge status: Home / Self Care | Payer: Self-pay | Source: Home / Self Care | Attending: Urology

## 2022-04-08 ENCOUNTER — Ambulatory Visit: Payer: No Typology Code available for payment source | Admitting: Anesthesiology

## 2022-04-08 ENCOUNTER — Ambulatory Visit: Payer: No Typology Code available for payment source

## 2022-04-08 ENCOUNTER — Ambulatory Visit
Admission: RE | Admit: 2022-04-08 | Discharge: 2022-04-08 | Disposition: A | Discharge status: Home / Self Care | Payer: No Typology Code available for payment source | Attending: Urology | Admitting: Urology

## 2022-04-08 DIAGNOSIS — N201 Calculus of ureter: Secondary | ICD-10-CM

## 2022-04-08 DIAGNOSIS — E1122 Type 2 diabetes mellitus with diabetic chronic kidney disease: Secondary | ICD-10-CM | POA: Diagnosis not present

## 2022-04-08 DIAGNOSIS — I129 Hypertensive chronic kidney disease with stage 1 through stage 4 chronic kidney disease, or unspecified chronic kidney disease: Secondary | ICD-10-CM | POA: Diagnosis not present

## 2022-04-08 DIAGNOSIS — N184 Chronic kidney disease, stage 4 (severe): Secondary | ICD-10-CM | POA: Diagnosis not present

## 2022-04-08 HISTORY — PX: CYSTOSCOPY/URETEROSCOPY/HOLMIUM LASER/STENT PLACEMENT: SHX6546

## 2022-04-08 HISTORY — PX: CYSTOSCOPY W/ RETROGRADES: SHX1426

## 2022-04-08 LAB — GLUCOSE, CAPILLARY: Glucose-Capillary: 76 mg/dL (ref 70–99)

## 2022-04-08 SURGERY — CYSTOSCOPY/URETEROSCOPY/HOLMIUM LASER/STENT PLACEMENT
Anesthesia: General | Laterality: Right

## 2022-04-08 MED ORDER — FENTANYL CITRATE (PF) 100 MCG/2ML IJ SOLN
INTRAMUSCULAR | Status: AC
Start: 2022-04-08 — End: ?
  Filled 2022-04-08: qty 2

## 2022-04-08 MED ORDER — PHENYLEPHRINE HCL (PRESSORS) 10 MG/ML IV SOLN
INTRAVENOUS | Status: DC | PRN
  Administered 2022-04-08: 80 ug via INTRAVENOUS

## 2022-04-08 MED ORDER — IOHEXOL 180 MG/ML  SOLN
INTRAMUSCULAR | Status: DC | PRN
Start: 2022-04-08 — End: 2022-04-08
  Administered 2022-04-08: 10 mL

## 2022-04-08 MED ORDER — ONDANSETRON HCL 4 MG/2ML IJ SOLN
INTRAMUSCULAR | Status: AC
  Filled 2022-04-08: qty 2

## 2022-04-08 MED ORDER — ONDANSETRON HCL 4 MG/2ML IJ SOLN: 4.0000 mg | Freq: Once | INTRAMUSCULAR | Status: DC | PRN

## 2022-04-08 MED ORDER — PROPOFOL 10 MG/ML IV BOLUS
INTRAVENOUS | Status: DC | PRN
  Administered 2022-04-08: 100 mg via INTRAVENOUS

## 2022-04-08 MED ORDER — SEVOFLURANE IN SOLN
RESPIRATORY_TRACT | Status: AC
  Filled 2022-04-08: qty 250

## 2022-04-08 MED ORDER — FENTANYL CITRATE (PF) 100 MCG/2ML IJ SOLN
INTRAMUSCULAR | Status: AC
  Filled 2022-04-08: qty 2

## 2022-04-08 MED ORDER — ONDANSETRON HCL 4 MG/2ML IJ SOLN
INTRAMUSCULAR | Status: DC | PRN
Start: 2022-04-08 — End: 2022-04-08
  Administered 2022-04-08: 4 mg via INTRAVENOUS

## 2022-04-08 MED ORDER — LIDOCAINE HCL (CARDIAC) PF 100 MG/5ML IV SOSY
PREFILLED_SYRINGE | INTRAVENOUS | Status: DC | PRN
  Administered 2022-04-08: 100 mg via INTRAVENOUS

## 2022-04-08 MED ORDER — FENTANYL CITRATE (PF) 100 MCG/2ML IJ SOLN
INTRAMUSCULAR | Status: DC | PRN
Start: 2022-04-08 — End: 2022-04-08
  Administered 2022-04-08 (×2): 50 ug via INTRAVENOUS

## 2022-04-08 MED ORDER — LIDOCAINE HCL (PF) 2 % IJ SOLN
INTRAMUSCULAR | Status: AC
  Filled 2022-04-08: qty 5

## 2022-04-08 MED ORDER — LACTATED RINGERS IV SOLN: INTRAVENOUS | Status: DC

## 2022-04-08 MED ORDER — LACTATED RINGERS IV SOLN: INTRAVENOUS | Status: DC | PRN

## 2022-04-08 MED ORDER — DEXAMETHASONE SODIUM PHOSPHATE 10 MG/ML IJ SOLN
INTRAMUSCULAR | Status: AC
  Filled 2022-04-08: qty 1

## 2022-04-08 MED ORDER — SODIUM CHLORIDE 0.9 % IV SOLN
INTRAVENOUS | Status: DC
Start: 2022-04-08 — End: 2022-04-08

## 2022-04-08 MED ORDER — PROPOFOL 10 MG/ML IV BOLUS
INTRAVENOUS | Status: AC
  Filled 2022-04-08: qty 20

## 2022-04-08 MED ORDER — SODIUM CHLORIDE 0.9 % IR SOLN
Status: DC | PRN
  Administered 2022-04-08: 3000 mL via INTRAVESICAL

## 2022-04-08 MED ORDER — PHENYLEPHRINE 80 MCG/ML (10ML) SYRINGE FOR IV PUSH (FOR BLOOD PRESSURE SUPPORT)
PREFILLED_SYRINGE | INTRAVENOUS | Status: AC
  Filled 2022-04-08: qty 10

## 2022-04-08 MED ORDER — DEXAMETHASONE SODIUM PHOSPHATE 10 MG/ML IJ SOLN
INTRAMUSCULAR | Status: DC | PRN
  Administered 2022-04-08: 5 mg via INTRAVENOUS

## 2022-04-08 MED ORDER — FENTANYL CITRATE (PF) 100 MCG/2ML IJ SOLN: 25.0000 ug | INTRAMUSCULAR | Status: DC | PRN

## 2022-04-08 MED ORDER — GENTAMICIN SULFATE 40 MG/ML IJ SOLN
5.0000 mg/kg | INTRAVENOUS | Status: AC
  Administered 2022-04-08: 340 mg via INTRAVENOUS
  Filled 2022-04-08: qty 8.5

## 2022-04-08 MED ORDER — ACETAMINOPHEN 10 MG/ML IV SOLN: 1000.0000 mg | Freq: Once | INTRAVENOUS | Status: DC | PRN

## 2022-04-08 SURGICAL SUPPLY — 27 items
BAG DRAIN CYSTO-URO LG1000N (MISCELLANEOUS) ×2 IMPLANT
BASKET LASER NITINOL 1.9FR (BASKET) ×1 IMPLANT
BASKET ZERO TIP 1.9FR (BASKET) IMPLANT
BRUSH SCRUB EZ 1% IODOPHOR (MISCELLANEOUS) ×2 IMPLANT
CATH URET FLEX-TIP 2 LUMEN 10F (CATHETERS) IMPLANT
CATH URETL OPEN END 6X70 (CATHETERS) IMPLANT
CNTNR SPEC 2.5X3XGRAD LEK (MISCELLANEOUS)
CONT SPEC 4OZ STER OR WHT (MISCELLANEOUS)
CONTAINER SPEC 2.5X3XGRAD LEK (MISCELLANEOUS) IMPLANT
DRAPE UTILITY 15X26 TOWEL STRL (DRAPES) ×2 IMPLANT
GLOVE SURG UNDER POLY LF SZ7.5 (GLOVE) ×2 IMPLANT
GOWN STRL REUS W/ TWL LRG LVL3 (GOWN DISPOSABLE) ×1 IMPLANT
GOWN STRL REUS W/ TWL XL LVL3 (GOWN DISPOSABLE) ×1 IMPLANT
GOWN STRL REUS W/TWL LRG LVL3 (GOWN DISPOSABLE) ×1
GOWN STRL REUS W/TWL XL LVL3 (GOWN DISPOSABLE) ×1
GUIDEWIRE STR DUAL SENSOR (WIRE) ×3 IMPLANT
IV NS IRRIG 3000ML ARTHROMATIC (IV SOLUTION) ×2 IMPLANT
KIT TURNOVER CYSTO (KITS) ×2 IMPLANT
PACK CYSTO AR (MISCELLANEOUS) ×2 IMPLANT
SET CYSTO W/LG BORE CLAMP LF (SET/KITS/TRAYS/PACK) ×2 IMPLANT
SHEATH URETERAL 12FRX35CM (MISCELLANEOUS) IMPLANT
STENT URET 6FRX24 CONTOUR (STENTS) IMPLANT
STENT URET 6FRX26 CONTOUR (STENTS) ×1 IMPLANT
SURGILUBE 2OZ TUBE FLIPTOP (MISCELLANEOUS) ×2 IMPLANT
TRACTIP FLEXIVA PULSE ID 200 (Laser) ×2 IMPLANT
VALVE UROSEAL ADJ ENDO (VALVE) IMPLANT
WATER STERILE IRR 500ML POUR (IV SOLUTION) ×2 IMPLANT

## 2022-04-08 NOTE — Anesthesia Postprocedure Evaluation (Signed)
Anesthesia Post Note ? ?Patient: Dana [name] ? ?Procedure(s) Performed: CYSTOSCOPY/URETEROSCOPY/HOLMIUM LASER/STENT PLACEMENT (Right) ?CYSTOSCOPY WITH RETROGRADE PYELOGRAM (Right) ? ?Patient location during evaluation: PACU ?Anesthesia Type: General ?Level of consciousness: awake and alert ?Pain management: pain level controlled ?Vital Signs Assessment: post-procedure vital signs reviewed and stable ?Respiratory status: spontaneous breathing, nonlabored ventilation, respiratory function stable and patient connected to nasal cannula oxygen ?Cardiovascular status: blood pressure returned to baseline and stable ?Postop Assessment: no apparent nausea or vomiting ?Anesthetic complications: no ? ? ?No notable events documented. ? ? ?Last Vitals:  ?Vitals:  ? 04/08/22 1415 04/08/22 1423  ?BP: (!) 165/80   ?Pulse: 70 71  ?Resp:    ?Temp:  36.4 ?C  ?SpO2: 98% 97%  ?  ?Last Pain:  ?Vitals:  ? 04/08/22 1423  ?TempSrc:   ?PainSc: 0-No pain  ? ? ?  ?  ?  ?  ?  ?  ? ?Precious Haws [name] ? ? ? ? ?

## 2022-04-08 NOTE — Transfer of Care (Signed)
Immediate Anesthesia Transfer of Care Note ? ?Patient: Dana [name] ? ?Procedure(s) Performed: CYSTOSCOPY/URETEROSCOPY/HOLMIUM LASER/STENT PLACEMENT (Right) ?CYSTOSCOPY WITH RETROGRADE PYELOGRAM (Right) ? ?Patient Location: PACU ? ?Anesthesia Type:General ? ?Level of Consciousness: drowsy ? ?Airway & Oxygen Therapy: Patient Spontanous Breathing and Patient connected to face mask oxygen ? ?Post-op Assessment: Report given to RN and Post -op Vital signs reviewed and stable ? ?Post vital signs: Reviewed and stable ? ?Last Vitals:  ?Vitals Value Taken Time  ?BP 144/81 04/08/22 1357  ?Temp 98.8 04/08/22  1357  ?Pulse 72 04/08/22 1359  ?Resp 12 04/08/22 1359  ?SpO2 100 % 04/08/22 1359  ?Vitals shown include unvalidated device data. ? ?Last Pain:  ?Vitals:  ? 04/08/22 0937  ?TempSrc: Temporal  ?PainSc: 0-No pain  ?   ? ?  ? ?Complications: No notable events documented. ?

## 2022-04-08 NOTE — Op Note (Signed)
Preoperative diagnosis:  ?Right proximal ureteral calculus ? ?Postoperative diagnosis:  ?1.  Right proximal ureteral calculus ? ?Procedure: ? ?Cystoscopy ?Right ureteroscopy and stone removal ?Ureteroscopic laser lithotripsy ?Right ureteral stent exchange (42F/26 cm)  ? ?Surgeon: Ronda Fairly. Zannie Locastro, M.D. ? ?Anesthesia: General ? ?Complications: None ? ?Intraoperative findings:  ?Right proximal ureteral calculus ? ? ?EBL: Minimal ? ?Specimens: ?Calculus fragments for analysis ? ? ?Indication: Ednah Reasner is a 76 y.o. female with a 5 mm right proximal ureteral calculus status post ureteral stent placement 03/08/2022 for an obstructing calculus with leukocytosis and AKI.  She presents today for definitive stone treatment.  After reviewing the management options for treatment, the patient elected to proceed with the above surgical procedure(s). We have discussed the potential benefits and risks of the procedure, side effects of the proposed treatment, the likelihood of the patient achieving the goals of the procedure, and any potential problems that might occur during the procedure or recuperation. Informed consent has been obtained. ? ?Description of procedure: ? ?The patient was taken to the operating room and general anesthesia was induced.  The patient was placed in the dorsal lithotomy position, prepped and draped in the usual sterile fashion, and preoperative antibiotics were administered. A preoperative time-out was performed.  ? ?A 21 French cystoscope was lubricated and passed per urethra.  Bladder mucosa showed no erythema, solid or papillary lesions.  Mild inflammatory changes right UO secondary to indwelling stent were noted. ? ?The stent was grasped with endoscopic forceps and brought out to the urethral meatus.  A 0.038 Sensor wire was placed through the stent and advanced to the renal pelvis under fluoroscopic guidance without difficulty. ? ?The ureteral stent was removed and a 4.5 French semirigid  ureteroscope was passed per urethra.  The right ureteral orifice was easily engaged without dilation and advanced to the proximal ureter where the calculus was identified. ? ?A 243 holmium laser fiber was placed through the ureteroscope and the stone was dusted at a setting of  ?0.3 J / 40 Hz.  A 3 mm fragment was placed in a 1.9 [place] nitinol basket, removed and sent for analysis.  The ureteroscope was repassed and only minute particles remain which were not large enough to be placed in the basket. ? ?A 42F/26 cm Contour ureteral stent was then placed on fluoroscopic guidance.  There was good curl seen in the renal pelvis and bladder under fluoroscopy. ? ?The bladder was then emptied and the procedure ended.  The patient appeared to tolerate the procedure well and without complications.  After anesthetic reversal the patient was transported to the PACU in stable condition.  ? ?Plan: ?The stent was left attached to a tether.  She was not comfortable removing herself and will follow-up in the office 04/10/2022 for stent removal ? ? ?John Giovanni, MD ? ?

## 2022-04-08 NOTE — Interval H&P Note (Signed)
History and Physical Interval Note: ? ?04/08/2022 ?10:43 AM ? ?Dana [name]  has presented today for surgery, with the diagnosis of Right Ureteral Stone.  The various methods of treatment have been discussed with the patient and family. After consideration of risks, benefits and other options for treatment, the patient has consented to  Procedure(s): ?CYSTOSCOPY/URETEROSCOPY/HOLMIUM LASER/STENT PLACEMENT (Right) as a surgical intervention.  The patient's history has been reviewed, patient examined, no change in status, stable for surgery.  I have reviewed the patient's chart and labs.  Questions were answered to the patient's satisfaction.   ? ? ?Michale Emmerich C [name] ? ? ?

## 2022-04-08 NOTE — Anesthesia Procedure Notes (Signed)
Procedure Name: LMA Insertion ?Date/Time: 04/08/2022 1:13 PM ?Performed by: Cammie Sickle, CRNA ?Pre-anesthesia Checklist: Patient identified, Patient being monitored, Timeout performed, Emergency Drugs available and Suction available ?Patient Re-evaluated:Patient Re-evaluated prior to induction ?Oxygen Delivery Method: Circle system utilized ?Preoxygenation: Pre-oxygenation with 100% oxygen ?Induction Type: IV induction ?Ventilation: Mask ventilation without difficulty ?LMA: LMA inserted ?LMA Size: 3.0 ?Tube type: Oral ?Number of attempts: 1 ?Placement Confirmation: positive ETCO2 and breath sounds checked- equal and bilateral ?Tube secured with: Tape ?Dental Injury: Teeth and Oropharynx as per pre-operative assessment  ? ? ? ? ?

## 2022-04-08 NOTE — Discharge Instructions (Addendum)
DISCHARGE INSTRUCTIONS FOR KIDNEY STONE/URETERAL STENT  ? ?MEDICATIONS:  ?1. Resume all your other meds from home.  ?2.  AZO (over-the-counter) can help with the burning/stinging when you urinate. ? ? ?ACTIVITY:  ?1. May resume regular activities in 24 hours. ?2. No driving while on narcotic pain medications  ?3. Drink plenty of water  ?4. Continue to walk at home - you can still get blood clots when you are at home, so keep active, but don't over do it.  ?5. May return to work/school tomorrow or when you feel ready  ? ?BATHING:  ?1. You can shower. ?2. You have a string coming from your urethra: The stent string is attached to your ureteral stent. Do not pull on this.  ? ?SIGNS/SYMPTOMS TO CALL:  ?Common postoperative symptoms include urinary frequency, urgency, bladder spasm and blood in the urine ? ?Please call us if you have a fever greater than 101.5, uncontrolled nausea/vomiting, uncontrolled pain, dizziness, unable to urinate, excessively bloody urine, chest pain, shortness of breath, leg swelling, leg pain, or any other concerns or questions.  ? ?You can reach [place] at [phone].  ? ?FOLLOW-UP:  ?1. You we will be contacted by our office for an appointment Thursday 04/10/2022 for stent removal ? ?AMBULATORY SURGERY  ?DISCHARGE INSTRUCTIONS ? ? ?The drugs that you were given will stay in your system until tomorrow so for the next 24 hours you should not: ? ?Drive an automobile ?Make any legal decisions ?Drink any alcoholic beverage ? ? ?You may resume regular meals tomorrow.  Today it is better to start with liquids and gradually work up to solid foods. ? ?You may eat anything you prefer, but it is better to start with liquids, then soup and crackers, and gradually work up to solid foods. ? ? ?Please notify your doctor immediately if you have any unusual bleeding, trouble breathing, redness and pain at the surgery site, drainage, fever, or pain not relieved by medication. ? ? ? ?Additional  Instructions: ? ? ? ? ?Please contact your physician with any problems or Same Day Surgery at [phone], Monday through Friday 6 am to 4 pm, or [place] at [facility] number at [phone].  ?

## 2022-04-08 NOTE — Anesthesia Preprocedure Evaluation (Addendum)
Anesthesia Evaluation  ?Patient identified by MRN, date of birth, ID band ?Patient awake ? ? ? ?Reviewed: ?Allergy & Precautions, NPO status , Patient's Chart, lab work & pertinent test results ? ?History of Anesthesia Complications ?Negative for: history of anesthetic complications ? ?Airway ?Mallampati: IV ? ? ?Neck ROM: Full ? ? ? Dental ? ?(+) Chipped ?  ?Pulmonary ?neg pulmonary ROS,  ?  ?Pulmonary exam normal ?breath sounds clear to auscultation ? ? ? ? ? ? Cardiovascular ?hypertension, + Peripheral Vascular Disease  ?Normal cardiovascular exam ?Rhythm:Regular Rate:Normal ? ?ECG 03/30/22:  ?Normal sinus rhythm ?Moderate voltage criteria for LVH, may be normal variant ( R in aVL , Cornell product ) ?  ?Neuro/Psych ?negative neurological ROS ?   ? GI/Hepatic ?negative GI ROS,   ?Endo/Other  ?diabetes, Type 2 ? Renal/GU ?Renal disease (nephrolithiasis, stage IV CKD)  ? ?  ?Musculoskeletal ? ? Abdominal ?  ?Peds ? Hematology ? ?(+) Blood dyscrasia, anemia ,   ?Anesthesia Other Findings ? ? Reproductive/Obstetrics ? ?  ? ? ? ? ? ? ? ? ? ? ? ? ? ?  ?  ? ? ? ? ? ? ? ?Anesthesia Physical ?Anesthesia Plan ? ?ASA: 2 ? ?Anesthesia Plan: General  ? ?Post-op Pain Management:   ? ?Induction: Intravenous ? ?PONV Risk Score and Plan: 3 and Ondansetron, Dexamethasone and Treatment may vary due to age or medical condition ? ?Airway Management Planned: LMA ? ?Additional Equipment:  ? ?Intra-op Plan:  ? ?Post-operative Plan: Extubation in OR ? ?Informed Consent: I have reviewed the patients History and Physical, chart, labs and discussed the procedure including the risks, benefits and alternatives for the proposed anesthesia with the patient or authorized representative who has indicated his/her understanding and acceptance.  ? ? ? ?Dental advisory given ? ?Plan Discussed with: CRNA ? ?Anesthesia Plan Comments: (Patient consented for risks of anesthesia including but not limited to:  ?- adverse  reactions to medications ?- damage to eyes, teeth, lips or other oral mucosa ?- nerve damage due to positioning  ?- sore throat or hoarseness ?- damage to heart, brain, nerves, lungs, other parts of body or loss of life ? ?Informed patient about role of CRNA in peri- and intra-operative care.  Patient voiced understanding.)  ? ? ? ? ? ?Anesthesia Quick Evaluation ? ?

## 2022-04-10 ENCOUNTER — Ambulatory Visit: Payer: No Typology Code available for payment source | Admitting: Physician Assistant

## 2022-04-11 ENCOUNTER — Ambulatory Visit (INDEPENDENT_AMBULATORY_CARE_PROVIDER_SITE_OTHER): Payer: No Typology Code available for payment source | Admitting: Physician Assistant

## 2022-04-11 ENCOUNTER — Ambulatory Visit: Payer: No Typology Code available for payment source | Admitting: Urology

## 2022-04-11 DIAGNOSIS — N201 Calculus of ureter: Secondary | ICD-10-CM

## 2022-04-11 NOTE — Progress Notes (Signed)
Patient ID: Dana [name], female   DOB: 1946/05/28, 76 y.o.   MRN: [id number] ?The stent was left attached to a tether. Stent removed with no complications. Pt advised to call if any problems to call the office. ?

## 2022-04-12 LAB — CALCULI, WITH PHOTOGRAPH (CLINICAL LAB)
Calcium Oxalate Dihydrate: 20 %
Calcium Oxalate Monohydrate: 80 %
Weight Calculi: 15 mg

## 2022-04-15 ENCOUNTER — Telehealth: Payer: Self-pay | Admitting: Family Medicine

## 2022-04-15 NOTE — Telephone Encounter (Signed)
Unable to reach patient. The number is not in service. Tried the number on DPR and it is not in service either.  ?

## 2022-04-15 NOTE — Telephone Encounter (Signed)
-----   Message from Nori Riis, PA-C sent at 04/15/2022  9:25 AM EDT ----- ?Please let Dana [name] know that her stone analysis revealed that her stone was calcium oxalate and that general stone prevention techniques including drinking plenty water with goal of producing 2.5 L urine daily, increased citric acid intake, avoidance of high oxalate containing foods, and decreased salt intake.  If she would like, we can order a 24 hour metabolic work up for her to see why she in particular is predisposed to making stones.   ?

## 2022-04-16 NOTE — Telephone Encounter (Signed)
LMOM for patient to return call.

## 2022-04-17 NOTE — Telephone Encounter (Signed)
Notified patient as instructed. She will call us and let us know about 24 hour metabolic  ? ?

## 2022-04-24 ENCOUNTER — Encounter: Payer: No Typology Code available for payment source | Admitting: Urology

## 2022-05-08 DIAGNOSIS — R269 Unspecified abnormalities of gait and mobility: Secondary | ICD-10-CM | POA: Diagnosis not present

## 2022-05-08 DIAGNOSIS — N200 Calculus of kidney: Secondary | ICD-10-CM | POA: Diagnosis not present

## 2022-05-08 DIAGNOSIS — N184 Chronic kidney disease, stage 4 (severe): Secondary | ICD-10-CM | POA: Diagnosis not present

## 2022-05-08 DIAGNOSIS — N2581 Secondary hyperparathyroidism of renal origin: Secondary | ICD-10-CM | POA: Diagnosis not present

## 2022-05-08 DIAGNOSIS — E1122 Type 2 diabetes mellitus with diabetic chronic kidney disease: Secondary | ICD-10-CM | POA: Diagnosis not present

## 2022-05-08 DIAGNOSIS — I129 Hypertensive chronic kidney disease with stage 1 through stage 4 chronic kidney disease, or unspecified chronic kidney disease: Secondary | ICD-10-CM | POA: Diagnosis not present

## 2022-05-08 DIAGNOSIS — R801 Persistent proteinuria, unspecified: Secondary | ICD-10-CM | POA: Diagnosis not present

## 2022-05-23 ENCOUNTER — Inpatient Hospital Stay: Payer: No Typology Code available for payment source | Attending: Internal Medicine | Admitting: Internal Medicine

## 2022-05-23 ENCOUNTER — Encounter: Payer: Self-pay | Admitting: Internal Medicine

## 2022-05-23 ENCOUNTER — Inpatient Hospital Stay: Payer: No Typology Code available for payment source

## 2022-05-23 DIAGNOSIS — E1022 Type 1 diabetes mellitus with diabetic chronic kidney disease: Secondary | ICD-10-CM | POA: Insufficient documentation

## 2022-05-23 DIAGNOSIS — D649 Anemia, unspecified: Secondary | ICD-10-CM | POA: Insufficient documentation

## 2022-05-23 DIAGNOSIS — K921 Melena: Secondary | ICD-10-CM | POA: Diagnosis not present

## 2022-05-23 DIAGNOSIS — N184 Chronic kidney disease, stage 4 (severe): Secondary | ICD-10-CM | POA: Insufficient documentation

## 2022-05-23 DIAGNOSIS — K5900 Constipation, unspecified: Secondary | ICD-10-CM | POA: Diagnosis not present

## 2022-05-23 DIAGNOSIS — Z79899 Other long term (current) drug therapy: Secondary | ICD-10-CM | POA: Insufficient documentation

## 2022-05-23 DIAGNOSIS — Z794 Long term (current) use of insulin: Secondary | ICD-10-CM | POA: Insufficient documentation

## 2022-05-23 DIAGNOSIS — I129 Hypertensive chronic kidney disease with stage 1 through stage 4 chronic kidney disease, or unspecified chronic kidney disease: Secondary | ICD-10-CM | POA: Insufficient documentation

## 2022-05-23 LAB — COMPREHENSIVE METABOLIC PANEL
ALT: 17 U/L (ref 0–44)
AST: 25 U/L (ref 15–41)
Albumin: 3.1 g/dL — ABNORMAL LOW (ref 3.5–5.0)
Alkaline Phosphatase: 100 U/L (ref 38–126)
Anion gap: 8 (ref 5–15)
BUN: 49 mg/dL — ABNORMAL HIGH (ref 8–23)
CO2: 23 mmol/L (ref 22–32)
Calcium: 8.2 mg/dL — ABNORMAL LOW (ref 8.9–10.3)
Chloride: 105 mmol/L (ref 98–111)
Creatinine, Ser: 3.07 mg/dL — ABNORMAL HIGH (ref 0.44–1.00)
GFR, Estimated: 15 mL/min — ABNORMAL LOW (ref >60)
Glucose, Bld: 140 mg/dL — ABNORMAL HIGH (ref 70–99)
Potassium: 4.2 mmol/L (ref 3.5–5.1)
Sodium: 136 mmol/L (ref 135–145)
Total Bilirubin: 0.1 mg/dL — ABNORMAL LOW (ref 0.3–1.2)
Total Protein: 7.4 g/dL (ref 6.5–8.1)

## 2022-05-23 LAB — CBC WITH DIFFERENTIAL/PLATELET
Abs Immature Granulocytes: 0.07 10*3/uL (ref 0.00–0.07)
Basophils Absolute: 0 10*3/uL (ref 0.0–0.1)
Basophils Relative: 0 %
Eosinophils Absolute: 0.2 10*3/uL (ref 0.0–0.5)
Eosinophils Relative: 4 %
HCT: 26.3 % — ABNORMAL LOW (ref 36.0–46.0)
Hemoglobin: 8.1 g/dL — ABNORMAL LOW (ref 12.0–15.0)
Immature Granulocytes: 1 %
Lymphocytes Relative: 27 %
Lymphs Abs: 1.9 10*3/uL (ref 0.7–4.0)
MCH: 28.9 pg (ref 26.0–34.0)
MCHC: 30.8 g/dL (ref 30.0–36.0)
MCV: 93.9 fL (ref 80.0–100.0)
Monocytes Absolute: 0.6 10*3/uL (ref 0.1–1.0)
Monocytes Relative: 8 %
Neutro Abs: 4.1 10*3/uL (ref 1.7–7.7)
Neutrophils Relative %: 60 %
Platelets: 274 10*3/uL (ref 150–400)
RBC: 2.8 MIL/uL — ABNORMAL LOW (ref 3.87–5.11)
RDW: 13.8 % (ref 11.5–15.5)
WBC: 6.8 10*3/uL (ref 4.0–10.5)
nRBC: 0 % (ref 0.0–0.2)

## 2022-05-23 LAB — RETICULOCYTES
Immature Retic Fract: 12.3 % (ref 2.3–15.9)
RBC.: 2.76 MIL/uL — ABNORMAL LOW (ref 3.87–5.11)
Retic Count, Absolute: 38.4 10*3/uL (ref 19.0–186.0)
Retic Ct Pct: 1.4 % (ref 0.4–3.1)

## 2022-05-23 LAB — TECHNOLOGIST SMEAR REVIEW
Plt Morphology: NORMAL
RBC MORPHOLOGY: NORMAL
WBC MORPHOLOGY: NORMAL

## 2022-05-23 LAB — IRON AND TIBC
Iron: 42 ug/dL (ref 28–170)
Saturation Ratios: 15 % (ref 10.4–31.8)
TIBC: 284 ug/dL (ref 250–450)
UIBC: 242 ug/dL

## 2022-05-23 LAB — LACTATE DEHYDROGENASE: LDH: 165 U/L (ref 98–192)

## 2022-05-23 LAB — FERRITIN: Ferritin: 35 ng/mL (ref 11–307)

## 2022-05-23 NOTE — Assessment & Plan Note (Addendum)
#   Chronic anemia-normocytic recently getting worse.  Patient is not very symptomatic from her anemia. The etiology is unclear; however suspect chronic renal disease.  Continue oral iron [slow Fe]-mild constipation black stool.   # Discussed at length the pathophysiology of anemia from chronic kidney disease which includes decreased erythropoietin production; and decreased iron stores in the body.   #  I discussed that I would recommend using iron infusion/Venofer; along with Retacrit to maintain hemoglobin around 10.  I discussed the potential acute infusion reactions with IV iron; which are quite rare.  Patient understands the risk; however wants to hold off infusions until repeat blood work is available.   #  Also discussed the role of Retacrit boosting the hemoglobin.  However await above blood work/iron infusions.  #I recommend CBC CMP LDH peripheral smear; haptoglobin; erythropoietin; iron studies ferritin K81 folic acid; reticulocyte count; myeloma panel kappa lambda light chain ratio.      # DM- on Insulin [Dr.kalisetti]- stable.   Thank you Dr.Singh for allowing me to participate in the care of your pleasant patient. Please do not hesitate to contact me with questions or concerns in the interim.  # DISPOSITION: # labs today-  # Follow up in 2 weeks- MD; No labs- possible venofer;possible retacrit-Dr.B  Cc: Dr.Kalisetti

## 2022-05-23 NOTE — Progress Notes (Signed)
[facility] NOTE  Patient Care Team: Gladstone Lighter, MD as PCP - General (Internal Medicine) Cammie Sickle, MD as Consulting Physician (Oncology)  CHIEF COMPLAINTS/PURPOSE OF CONSULTATION: ANEMIA   HEMATOLOGY HISTORY  # ANEMIA[Hb;8  MCV-95 platelets- WBC; Iron sat: 25% ferritin- 34;  GFR-18 CT/US; EGD- 4 years; [place] /colonoscopy-[last 5 years; [place]]  # CKD-IV [Dr.Kolluru/Dr.Singh]; March 2023-kidney stone hydronephrosis s/p stenting [Dr.Stoioff]/hospital-  HISTORY OF PRESENTING ILLNESS:   Dana [name] 76 y.o.  female pleasant patient was been referred to [place] for further evaluation of anemia.  Patient has a longstanding history of anemia attributed to her chronic kidney disease.   More recently patient was evaluated in the emergency room-in March 2023 for kidney stone.  Patient s/p stenting.   Patient being on iron pills for the last few weeks intermittently.  Denies any new shortness of breath or cough.  Denies any significant fatigue  Blood in stools: none Blood in urine: none Difficulty swallowing: Change of bowel movement/constipation: black/stool-constipation sec to PO iron.  Prior blood transfusion: none Prior history of blood loss: none Liver disease: none Bariatric surgery: none  Prior IV iron infusions:  none    Review of Systems  Constitutional:  Positive for malaise/fatigue. Negative for chills, diaphoresis, fever and weight loss.  HENT:  Negative for nosebleeds and sore throat.   Eyes:  Negative for double vision.  Respiratory:  Negative for cough, hemoptysis, sputum production, shortness of breath and wheezing.   Cardiovascular:  Negative for chest pain, palpitations, orthopnea and leg swelling.  Gastrointestinal:  Negative for abdominal pain, blood in stool, constipation, diarrhea, heartburn, melena, nausea and vomiting.  Genitourinary:  Negative for dysuria, frequency and urgency.  Musculoskeletal:  Negative for back pain and  joint pain.  Skin: Negative.  Negative for itching and rash.  Neurological:  Negative for dizziness, tingling, focal weakness, weakness and headaches.  Endo/Heme/Allergies:  Does not bruise/bleed easily.  Psychiatric/Behavioral:  Negative for depression. The patient is not nervous/anxious and does not have insomnia.     MEDICAL HISTORY:  Past Medical History:  Diagnosis Date   Anemia    Arthritis    Chronic kidney disease    DM (diabetes mellitus), type 1 (HCC)    History of kidney stones    Hypertension    Peripheral vascular disease ([place])     SURGICAL HISTORY: Past Surgical History:  Procedure Laterality Date   ABDOMINAL HYSTERECTOMY     COLONOSCOPY     CYSTOSCOPY W/ RETROGRADES Right 04/08/2022   Procedure: CYSTOSCOPY WITH RETROGRADE PYELOGRAM;  Surgeon: Abbie Sons, MD;  Location: [facility];  Service: Urology;  Laterality: Right;   CYSTOSCOPY WITH STENT PLACEMENT Right 03/08/2022   Procedure: CYSTOSCOPY WITH RIGHT URETERALSTENT PLACEMENT;  Surgeon: Abbie Sons, MD;  Location: [facility];  Service: Urology;  Laterality: Right;   CYSTOSCOPY/URETEROSCOPY/HOLMIUM LASER/STENT PLACEMENT Right 04/08/2022   Procedure: CYSTOSCOPY/URETEROSCOPY/HOLMIUM LASER/STENT PLACEMENT;  Surgeon: Abbie Sons, MD;  Location: [facility];  Service: Urology;  Laterality: Right;   EYE SURGERY Left 11/28/2021   HYSTERECTOMY ABDOMINAL WITH SALPINGO-OOPHORECTOMY     URETEROSCOPY Right 03/08/2022   Procedure: URETEROSCOPY;  Surgeon: Abbie Sons, MD;  Location: [facility];  Service: Urology;  Laterality: Right;    SOCIAL HISTORY: Social History   Socioeconomic History   Marital status: Married    Spouse name: Toula Moos   Number of children: Not on file   Years of education: Not on file   Highest education level: Not on file  Occupational History   Not on file  Tobacco Use   Smoking status: Never   Smokeless tobacco: Never  Vaping Use   Vaping Use: Never used  Substance and Sexual  Activity   Alcohol use: Never   Drug use: Never   Sexual activity: Yes  Other Topics Concern   Not on file  Social History Narrative   From [place]; in 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    Social Determinants of Health   Financial Resource Strain: Not on file  Food Insecurity: Not on file  Transportation Needs: Not on file  Physical Activity: Not on file  Stress: Not on file  Social Connections: Not on file  Intimate Partner Violence: Not on file    FAMILY HISTORY: Family History  Problem Relation Age of Onset   Diabetes Mother    Hypertension Father    Diabetes Sister    Diabetes Sister    Leukemia Sister    Hypertension Sister    Diabetes Brother    Colon cancer Brother    Diabetes Brother    Diabetes Brother     ALLERGIES:  is allergic to clindamycin, clindamycin/lincomycin, chocolate, chocolate flavor, food color red, and food color yellow.  MEDICATIONS:  Current Outpatient Medications  Medication Sig Dispense Refill   acetaminophen (TYLENOL) 500 MG tablet Take 500 mg by mouth every 6 (six) hours as needed for mild pain.     amLODipine (NORVASC) 10 MG tablet TAKE 1 TABLET BY MOUTH EVERY DAY 90 tablet 0   aspirin 81 MG EC tablet Take 81 mg by mouth See admin instructions. Takes on Tues and Thur only due to history of eye bleed.     B Complex-C (B-COMPLEX WITH VITAMIN C) tablet Take 1 tablet by mouth daily.     Ferrous Sulfate 142 (45 Fe) MG TBCR Take 1 tablet by mouth every morning.     hydrochlorothiazide (HYDRODIURIL) 25 MG tablet Take by mouth.     insulin detemir (LEVEMIR FLEXTOUCH) 100 UNIT/ML FlexPen Inject 10 Units into the skin daily. 15 mL 0   JARDIANCE 10 MG TABS tablet Take 10 mg by mouth daily.     Omega-3 Fatty Acids (FISH OIL PO) Take 1 capsule by mouth daily.     sodium bicarbonate 650 MG tablet Take 1 tablet (650 mg total) by mouth 2 (two) times daily. 60 tablet 0   telmisartan-hydrochlorothiazide (MICARDIS HCT)  40-12.5 MG tablet Take 1 tablet by mouth daily.     Turmeric 500 MG TABS Take 500 mg by mouth daily at 6 (six) AM.     vitamin C (ASCORBIC ACID) 500 MG tablet Take 500 mg by mouth 2 (two) times daily.     VITAMIN D PO Take 2 tablets by mouth daily.     zinc gluconate 50 MG tablet Take 1 tablet by mouth daily.     rosuvastatin (CRESTOR) 10 MG tablet Take 10 mg by mouth daily. (Patient not taking: Reported on 05/23/2022)     No current facility-administered medications for this visit.     Marland Kitchen  PHYSICAL EXAMINATION:   Vitals:   05/23/22 1155  BP: 140/68  Pulse: 78  Resp: 16  Temp: (!) 97.1 F (36.2 C)  SpO2: 100%   Filed Weights   05/23/22 1155  Weight: 148 lb (67.1 kg)    Physical Exam Vitals and nursing note reviewed.  HENT:     Head: Normocephalic and atraumatic.     Mouth/Throat:  Pharynx: Oropharynx is clear.  Eyes:     Extraocular Movements: Extraocular movements intact.     Pupils: Pupils are equal, round, and reactive to light.  Cardiovascular:     Rate and Rhythm: Normal rate and regular rhythm.  Pulmonary:     Comments: Decreased breath sounds bilaterally.  Abdominal:     Palpations: Abdomen is soft.  Musculoskeletal:        General: Normal range of motion.     Cervical back: Normal range of motion.  Skin:    General: Skin is warm.  Neurological:     General: No focal deficit present.     Mental Status: She is alert and oriented to person, place, and time.  Psychiatric:        Behavior: Behavior normal.        Judgment: Judgment normal.     LABORATORY DATA:  I have reviewed the data as listed Lab Results  Component Value Date   WBC 6.8 05/23/2022   HGB 8.1 (L) 05/23/2022   HCT 26.3 (L) 05/23/2022   MCV 93.9 05/23/2022   PLT 274 05/23/2022   Recent Labs    03/14/22 0330 03/15/22 0454 03/30/22 1540 05/23/22 1223  NA 141 140 138 136  K 4.2 4.1 3.9 4.2  CL 116* 111 106 105  CO2 22 22 25 23   GLUCOSE 124* 101* 138* 140*  BUN 39* 37* 37*  49*  CREATININE 2.73* 2.49* 2.66* 3.07*  CALCIUM 8.2* 8.2* 8.5* 8.2*  GFRNONAA 17* 20* 18* 15*  PROT 6.1*  --  7.3 7.4  ALBUMIN 2.0*  --  2.9* 3.1*  AST 28  --  14* 25  ALT 23  --  11 17  ALKPHOS 96  --  82 100  BILITOT 0.3  --  0.5 0.1*  BILIDIR <0.1  --   --   --   IBILI NOT CALCULATED  --   --   --      No results found.  ASSESSMENT & PLAN:   Symptomatic anemia # Chronic anemia-normocytic recently getting worse.  Patient is not very symptomatic from her anemia. The etiology is unclear; however suspect chronic renal disease.  Continue oral iron [slow Fe]-mild constipation black stool.   # Discussed at length the pathophysiology of anemia from chronic kidney disease which includes decreased erythropoietin production; and decreased iron stores in the body.   #  I discussed that I would recommend using iron infusion/Venofer; along with Retacrit to maintain hemoglobin around 10.  I discussed the potential acute infusion reactions with IV iron; which are quite rare.  Patient understands the risk; however wants to hold off infusions until repeat blood work is available.   #  Also discussed the role of Retacrit boosting the hemoglobin.  However await above blood work/iron infusions.  #I recommend CBC CMP LDH peripheral smear; haptoglobin; erythropoietin; iron studies ferritin E56 folic acid; reticulocyte count; myeloma panel kappa lambda light chain ratio.      # DM- on Insulin [Dr.kalisetti]- stable.   Thank you Dr.Singh for allowing me to participate in the care of your pleasant patient. Please do not hesitate to contact me with questions or concerns in the interim.  # DISPOSITION: # labs today-  # Follow up in 2 weeks- MD; No labs- possible venofer;possible retacrit-Dr.B  Cc: Dr.Kalisetti     All questions were answered. The patient knows to call the clinic with any problems, questions or concerns.    Cammie Sickle, MD 05/23/2022  1:15 PM

## 2022-05-23 NOTE — Progress Notes (Signed)
New patient referred by Dr Candiss Norse for Anemia in chronic kidney disease.

## 2022-05-24 LAB — HAPTOGLOBIN: Haptoglobin: 167 mg/dL (ref 42–346)

## 2022-05-26 LAB — KAPPA/LAMBDA LIGHT CHAINS
Kappa free light chain: 137.5 mg/L — ABNORMAL HIGH (ref 3.3–19.4)
Kappa, lambda light chain ratio: 1.89 — ABNORMAL HIGH (ref 0.26–1.65)
Lambda free light chains: 72.7 mg/L — ABNORMAL HIGH (ref 5.7–26.3)

## 2022-05-28 LAB — MULTIPLE MYELOMA PANEL, SERUM
Albumin SerPl Elph-Mcnc: 2.9 g/dL (ref 2.9–4.4)
Albumin/Glob SerPl: 0.9 (ref 0.7–1.7)
Alpha 1: 0.2 g/dL (ref 0.0–0.4)
Alpha2 Glob SerPl Elph-Mcnc: 0.8 g/dL (ref 0.4–1.0)
B-Globulin SerPl Elph-Mcnc: 1 g/dL (ref 0.7–1.3)
Gamma Glob SerPl Elph-Mcnc: 1.3 g/dL (ref 0.4–1.8)
Globulin, Total: 3.3 g/dL (ref 2.2–3.9)
IgA: 289 mg/dL (ref 64–422)
IgG (Immunoglobin G), Serum: 1320 mg/dL (ref 586–1602)
IgM (Immunoglobulin M), Srm: 114 mg/dL (ref 26–217)
Total Protein ELP: 6.2 g/dL (ref 6.0–8.5)

## 2022-05-28 LAB — ERYTHROPOIETIN: Erythropoietin: 8.5 m[IU]/mL (ref 2.6–18.5)

## 2022-06-09 ENCOUNTER — Inpatient Hospital Stay: Payer: No Typology Code available for payment source

## 2022-06-09 ENCOUNTER — Inpatient Hospital Stay: Payer: No Typology Code available for payment source | Admitting: Internal Medicine

## 2022-06-18 ENCOUNTER — Inpatient Hospital Stay: Payer: No Typology Code available for payment source

## 2022-06-18 ENCOUNTER — Inpatient Hospital Stay (HOSPITAL_BASED_OUTPATIENT_CLINIC_OR_DEPARTMENT_OTHER): Payer: No Typology Code available for payment source | Admitting: Internal Medicine

## 2022-06-18 DIAGNOSIS — D649 Anemia, unspecified: Secondary | ICD-10-CM | POA: Diagnosis not present

## 2022-06-18 DIAGNOSIS — B351 Tinea unguium: Secondary | ICD-10-CM | POA: Diagnosis not present

## 2022-06-18 DIAGNOSIS — M79675 Pain in left toe(s): Secondary | ICD-10-CM | POA: Diagnosis not present

## 2022-06-18 DIAGNOSIS — M79674 Pain in right toe(s): Secondary | ICD-10-CM | POA: Diagnosis not present

## 2022-06-18 NOTE — Assessment & Plan Note (Addendum)
#   Chronic anemia-normocytic recently getting worse.  Patient is not very symptomatic from her anemia.  June 2023 hemoglobin 8.1; iron saturation 15% ferritin 35. Continue PO iron BID.  However given the ongoing constipation would recommend-gentle iron twice a day.  As patient not significantly symptomatic would recommend holding off IV iron/retacrit at this time.  #Etiology of anemia: Likely secondary to chronic kidney disease;JUNE 2023- kappa lambda light chain ratio slightly abnormal however expected of CKD.  M protein- negative. Will repeat in 6 months.   # DM- on Insulin [Dr.kalisetti]- STABLE.   #Chronic kidney disease stage IV GFR 15-recommend follow-up with nephrology Dr. Candiss Norse for further recommendations.  # DISPOSITION: # HOLD venofer/retacrit # Follow up in 3 months- MD; labs- cbc/bmp; iron studies/ferritin- possible venofer--Dr.B  Cc: Dr.Kalisetti /Dr.Singh.

## 2022-06-18 NOTE — Patient Instructions (Signed)
#  Recommend gentle iron 1 pill twice day. It should not upset your stomach or cause constipation.  Talk to the pharmacist if you can find it/it is over-the-counter.

## 2022-06-18 NOTE — Progress Notes (Signed)
[facility] NOTE  Patient Care Team: Gladstone Lighter, MD as PCP - General (Internal Medicine) Cammie Sickle, MD as Consulting Physician (Oncology)  CHIEF COMPLAINTS/PURPOSE OF CONSULTATION: ANEMIA   HEMATOLOGY HISTORY  # ANEMIA[Hb;8  MCV-95 platelets- WBC; Iron sat: 25% ferritin- 34;  GFR-18 CT/US; EGD- 4 years; [place] /colonoscopy-[last 5 years; [place]]- JUNE  2023- iron sat- 15%; ferritin-35; MM panel-NEG; K/L= 1.89   # CKD-IV [Dr.Kolluru/Dr.Singh]; March 2023-kidney stone hydronephrosis s/p stenting [Dr.Stoioff]/hospital-   HISTORY OF PRESENTING ILLNESS: Alone.  Ambulating independently.  Dana [name] 76 y.o.  female pleasant patient with multiple medical problems including chronic kidney disease -is here to review results of her blood work ordered for anemia.  Patient being on iron pills for the last few weeks intermittently.  Iron pills cause constipation.  Denies any new shortness of breath or cough.  Denies any significant fatigue.     Review of Systems  Constitutional:  Positive for malaise/fatigue. Negative for chills, diaphoresis, fever and weight loss.  HENT:  Negative for nosebleeds and sore throat.   Eyes:  Negative for double vision.  Respiratory:  Negative for cough, hemoptysis, sputum production, shortness of breath and wheezing.   Cardiovascular:  Negative for chest pain, palpitations, orthopnea and leg swelling.  Gastrointestinal:  Negative for abdominal pain, blood in stool, constipation, diarrhea, heartburn, melena, nausea and vomiting.  Genitourinary:  Negative for dysuria, frequency and urgency.  Musculoskeletal:  Negative for back pain and joint pain.  Skin: Negative.  Negative for itching and rash.  Neurological:  Negative for dizziness, tingling, focal weakness, weakness and headaches.  Endo/Heme/Allergies:  Does not bruise/bleed easily.  Psychiatric/Behavioral:  Negative for depression. The patient is not nervous/anxious and  does not have insomnia.      MEDICAL HISTORY:  Past Medical History:  Diagnosis Date   Anemia    Arthritis    Chronic kidney disease    DM (diabetes mellitus), type 1 (HCC)    History of kidney stones    Hypertension    Peripheral vascular disease ([place])     SURGICAL HISTORY: Past Surgical History:  Procedure Laterality Date   ABDOMINAL HYSTERECTOMY     COLONOSCOPY     CYSTOSCOPY W/ RETROGRADES Right 04/08/2022   Procedure: CYSTOSCOPY WITH RETROGRADE PYELOGRAM;  Surgeon: Abbie Sons, MD;  Location: [facility];  Service: Urology;  Laterality: Right;   CYSTOSCOPY WITH STENT PLACEMENT Right 03/08/2022   Procedure: CYSTOSCOPY WITH RIGHT URETERALSTENT PLACEMENT;  Surgeon: Abbie Sons, MD;  Location: [facility];  Service: Urology;  Laterality: Right;   CYSTOSCOPY/URETEROSCOPY/HOLMIUM LASER/STENT PLACEMENT Right 04/08/2022   Procedure: CYSTOSCOPY/URETEROSCOPY/HOLMIUM LASER/STENT PLACEMENT;  Surgeon: Abbie Sons, MD;  Location: [facility];  Service: Urology;  Laterality: Right;   EYE SURGERY Left 11/28/2021   HYSTERECTOMY ABDOMINAL WITH SALPINGO-OOPHORECTOMY     URETEROSCOPY Right 03/08/2022   Procedure: URETEROSCOPY;  Surgeon: Abbie Sons, MD;  Location: [facility];  Service: Urology;  Laterality: Right;    SOCIAL HISTORY: Social History   Socioeconomic History   Marital status: Married    Spouse name: Toula Moos   Number of children: Not on file   Years of education: Not on file   Highest education level: Not on file  Occupational History   Not on file  Tobacco Use   Smoking status: Never   Smokeless tobacco: Never  Vaping Use   Vaping Use: Never used  Substance and Sexual Activity   Alcohol use: Never   Drug use: Never  Sexual activity: Yes  Other Topics Concern   Not on file  Social History Narrative   From [place]; in 57s to [place]. 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    Social Determinants of Health   Financial Resource Strain: Not on  file  Food Insecurity: Not on file  Transportation Needs: Not on file  Physical Activity: Not on file  Stress: Not on file  Social Connections: Not on file  Intimate Partner Violence: Not on file    FAMILY HISTORY: Family History  Problem Relation Age of Onset   Diabetes Mother    Hypertension Father    Diabetes Sister    Diabetes Sister    Leukemia Sister    Hypertension Sister    Diabetes Brother    Colon cancer Brother    Diabetes Brother    Diabetes Brother     ALLERGIES:  is allergic to clindamycin, clindamycin/lincomycin, chocolate, chocolate flavor, food color red, and food color yellow.  MEDICATIONS:  Current Outpatient Medications  Medication Sig Dispense Refill   acetaminophen (TYLENOL) 500 MG tablet Take 500 mg by mouth every 6 (six) hours as needed for mild pain.     amLODipine (NORVASC) 10 MG tablet TAKE 1 TABLET BY MOUTH EVERY DAY 90 tablet 0   aspirin 81 MG EC tablet Take 81 mg by mouth See admin instructions. Takes on Tues and Thur only due to history of eye bleed.     B Complex-C (B-COMPLEX WITH VITAMIN C) tablet Take 1 tablet by mouth daily.     Ferrous Sulfate 142 (45 Fe) MG TBCR Take 1 tablet by mouth every morning.     hydrochlorothiazide (HYDRODIURIL) 25 MG tablet Take by mouth.     insulin detemir (LEVEMIR FLEXTOUCH) 100 UNIT/ML FlexPen Inject 10 Units into the skin daily. 15 mL 0   JARDIANCE 10 MG TABS tablet Take 10 mg by mouth daily.     Omega-3 Fatty Acids (FISH OIL PO) Take 1 capsule by mouth daily.     sodium bicarbonate 650 MG tablet Take 1 tablet (650 mg total) by mouth 2 (two) times daily. 60 tablet 0   telmisartan-hydrochlorothiazide (MICARDIS HCT) 40-12.5 MG tablet Take 1 tablet by mouth daily.     Turmeric 500 MG TABS Take 500 mg by mouth daily at 6 (six) AM.     vitamin C (ASCORBIC ACID) 500 MG tablet Take 500 mg by mouth 2 (two) times daily.     VITAMIN D PO Take 2 tablets by mouth daily.     zinc gluconate 50 MG tablet Take 1 tablet by  mouth daily.     No current facility-administered medications for this visit.     Marland Kitchen  PHYSICAL EXAMINATION:   Vitals:   06/18/22 1300  BP: (!) 194/81  Pulse: 79  Temp: 97.7 F (36.5 C)  SpO2: 100%   Filed Weights   06/18/22 1300  Weight: 147 lb 6.4 oz (66.9 kg)    Physical Exam Vitals and nursing note reviewed.  HENT:     Head: Normocephalic and atraumatic.     Mouth/Throat:     Pharynx: Oropharynx is clear.  Eyes:     Extraocular Movements: Extraocular movements intact.     Pupils: Pupils are equal, round, and reactive to light.  Cardiovascular:     Rate and Rhythm: Normal rate and regular rhythm.  Pulmonary:     Comments: Decreased breath sounds bilaterally.  Abdominal:     Palpations: Abdomen is soft.  Musculoskeletal:  General: Normal range of motion.     Cervical back: Normal range of motion.  Skin:    General: Skin is warm.  Neurological:     General: No focal deficit present.     Mental Status: She is alert and oriented to person, place, and time.  Psychiatric:        Behavior: Behavior normal.        Judgment: Judgment normal.      LABORATORY DATA:  I have reviewed the data as listed Lab Results  Component Value Date   WBC 6.8 05/23/2022   HGB 8.1 (L) 05/23/2022   HCT 26.3 (L) 05/23/2022   MCV 93.9 05/23/2022   PLT 274 05/23/2022   Recent Labs    03/14/22 0330 03/15/22 0454 03/30/22 1540 05/23/22 1223  NA 141 140 138 136  K 4.2 4.1 3.9 4.2  CL 116* 111 106 105  CO2 22 22 25 23   GLUCOSE 124* 101* 138* 140*  BUN 39* 37* 37* 49*  CREATININE 2.73* 2.49* 2.66* 3.07*  CALCIUM 8.2* 8.2* 8.5* 8.2*  GFRNONAA 17* 20* 18* 15*  PROT 6.1*  --  7.3 7.4  ALBUMIN 2.0*  --  2.9* 3.1*  AST 28  --  14* 25  ALT 23  --  11 17  ALKPHOS 96  --  82 100  BILITOT 0.3  --  0.5 0.1*  BILIDIR <0.1  --   --   --   IBILI NOT CALCULATED  --   --   --      No results found.  ASSESSMENT & PLAN:   Symptomatic anemia # Chronic anemia-normocytic  recently getting worse.  Patient is not very symptomatic from her anemia.  June 2023 hemoglobin 8.1; iron saturation 15% ferritin 35. Continue PO iron BID.  However given the ongoing constipation would recommend-gentle iron twice a day.  As patient not significantly symptomatic would recommend holding off IV iron/retacrit at this time.  #Etiology of anemia: Likely secondary to chronic kidney disease;JUNE 2023- kappa lambda light chain ratio slightly abnormal however expected of CKD.  M protein- negative. Will repeat in 6 months.   # DM- on Insulin [Dr.kalisetti]- STABLE.   #Chronic kidney disease stage IV GFR 15-recommend follow-up with nephrology Dr. Candiss Norse for further recommendations.  # DISPOSITION: # HOLD venofer/retacrit # Follow up in 3 months- MD; labs- cbc/bmp; iron studies/ferritin- possible venofer--Dr.B  Cc: Dr.Kalisetti /Dr.Singh.    All questions were answered. The patient knows to call the clinic with any problems, questions or concerns.    Cammie Sickle, MD 06/18/2022 2:19 PM

## 2022-07-03 DIAGNOSIS — E010 Iodine-deficiency related diffuse (endemic) goiter: Secondary | ICD-10-CM | POA: Diagnosis not present

## 2022-07-03 DIAGNOSIS — I739 Peripheral vascular disease, unspecified: Secondary | ICD-10-CM | POA: Diagnosis not present

## 2022-07-03 DIAGNOSIS — E1121 Type 2 diabetes mellitus with diabetic nephropathy: Secondary | ICD-10-CM | POA: Diagnosis not present

## 2022-07-03 DIAGNOSIS — I10 Essential (primary) hypertension: Secondary | ICD-10-CM | POA: Diagnosis not present

## 2022-07-03 DIAGNOSIS — Z780 Asymptomatic menopausal state: Secondary | ICD-10-CM | POA: Diagnosis not present

## 2022-07-03 DIAGNOSIS — N1832 Chronic kidney disease, stage 3b: Secondary | ICD-10-CM | POA: Diagnosis not present

## 2022-07-03 DIAGNOSIS — Z1231 Encounter for screening mammogram for malignant neoplasm of breast: Secondary | ICD-10-CM | POA: Diagnosis not present

## 2022-07-03 DIAGNOSIS — Z794 Long term (current) use of insulin: Secondary | ICD-10-CM | POA: Diagnosis not present

## 2022-07-03 DIAGNOSIS — Z0000 Encounter for general adult medical examination without abnormal findings: Secondary | ICD-10-CM | POA: Diagnosis not present

## 2022-07-14 ENCOUNTER — Other Ambulatory Visit: Payer: Self-pay | Admitting: Internal Medicine

## 2022-07-14 DIAGNOSIS — Z1231 Encounter for screening mammogram for malignant neoplasm of breast: Secondary | ICD-10-CM

## 2022-07-30 DIAGNOSIS — Z780 Asymptomatic menopausal state: Secondary | ICD-10-CM | POA: Diagnosis not present

## 2022-08-30 ENCOUNTER — Other Ambulatory Visit: Payer: Self-pay

## 2022-08-30 ENCOUNTER — Inpatient Hospital Stay
Admission: EM | Admit: 2022-08-30 | Discharge: 2022-09-05 | DRG: 378 | Disposition: A | Discharge status: Home / Self Care | Payer: No Typology Code available for payment source | Attending: Internal Medicine | Admitting: Internal Medicine

## 2022-08-30 DIAGNOSIS — N184 Chronic kidney disease, stage 4 (severe): Secondary | ICD-10-CM | POA: Diagnosis not present

## 2022-08-30 DIAGNOSIS — D62 Acute posthemorrhagic anemia: Secondary | ICD-10-CM | POA: Diagnosis present

## 2022-08-30 DIAGNOSIS — Z794 Long term (current) use of insulin: Secondary | ICD-10-CM | POA: Diagnosis not present

## 2022-08-30 DIAGNOSIS — E785 Hyperlipidemia, unspecified: Secondary | ICD-10-CM | POA: Diagnosis not present

## 2022-08-30 DIAGNOSIS — E1129 Type 2 diabetes mellitus with other diabetic kidney complication: Secondary | ICD-10-CM | POA: Diagnosis present

## 2022-08-30 DIAGNOSIS — Z833 Family history of diabetes mellitus: Secondary | ICD-10-CM | POA: Diagnosis not present

## 2022-08-30 DIAGNOSIS — Z7984 Long term (current) use of oral hypoglycemic drugs: Secondary | ICD-10-CM | POA: Diagnosis not present

## 2022-08-30 DIAGNOSIS — K922 Gastrointestinal hemorrhage, unspecified: Secondary | ICD-10-CM | POA: Diagnosis not present

## 2022-08-30 DIAGNOSIS — K5731 Diverticulosis of large intestine without perforation or abscess with bleeding: Principal | ICD-10-CM | POA: Diagnosis present

## 2022-08-30 DIAGNOSIS — I120 Hypertensive chronic kidney disease with stage 5 chronic kidney disease or end stage renal disease: Secondary | ICD-10-CM | POA: Diagnosis present

## 2022-08-30 DIAGNOSIS — D631 Anemia in chronic kidney disease: Secondary | ICD-10-CM | POA: Diagnosis not present

## 2022-08-30 DIAGNOSIS — E11649 Type 2 diabetes mellitus with hypoglycemia without coma: Secondary | ICD-10-CM | POA: Diagnosis not present

## 2022-08-30 DIAGNOSIS — N179 Acute kidney failure, unspecified: Secondary | ICD-10-CM | POA: Diagnosis not present

## 2022-08-30 DIAGNOSIS — E1151 Type 2 diabetes mellitus with diabetic peripheral angiopathy without gangrene: Secondary | ICD-10-CM | POA: Diagnosis present

## 2022-08-30 DIAGNOSIS — Z7982 Long term (current) use of aspirin: Secondary | ICD-10-CM | POA: Diagnosis not present

## 2022-08-30 DIAGNOSIS — K920 Hematemesis: Secondary | ICD-10-CM | POA: Diagnosis not present

## 2022-08-30 DIAGNOSIS — Z91018 Allergy to other foods: Secondary | ICD-10-CM

## 2022-08-30 DIAGNOSIS — Z881 Allergy status to other antibiotic agents status: Secondary | ICD-10-CM

## 2022-08-30 DIAGNOSIS — E663 Overweight: Secondary | ICD-10-CM | POA: Diagnosis present

## 2022-08-30 DIAGNOSIS — Z7985 Long-term (current) use of injectable non-insulin antidiabetic drugs: Secondary | ICD-10-CM

## 2022-08-30 DIAGNOSIS — I10 Essential (primary) hypertension: Secondary | ICD-10-CM | POA: Diagnosis present

## 2022-08-30 DIAGNOSIS — D649 Anemia, unspecified: Secondary | ICD-10-CM | POA: Diagnosis not present

## 2022-08-30 DIAGNOSIS — E872 Acidosis, unspecified: Secondary | ICD-10-CM | POA: Diagnosis not present

## 2022-08-30 DIAGNOSIS — Z79899 Other long term (current) drug therapy: Secondary | ICD-10-CM | POA: Diagnosis not present

## 2022-08-30 DIAGNOSIS — E861 Hypovolemia: Secondary | ICD-10-CM | POA: Diagnosis not present

## 2022-08-30 DIAGNOSIS — Z8249 Family history of ischemic heart disease and other diseases of the circulatory system: Secondary | ICD-10-CM

## 2022-08-30 DIAGNOSIS — I129 Hypertensive chronic kidney disease with stage 1 through stage 4 chronic kidney disease, or unspecified chronic kidney disease: Secondary | ICD-10-CM | POA: Diagnosis not present

## 2022-08-30 DIAGNOSIS — M1990 Unspecified osteoarthritis, unspecified site: Secondary | ICD-10-CM | POA: Diagnosis present

## 2022-08-30 DIAGNOSIS — N185 Chronic kidney disease, stage 5: Secondary | ICD-10-CM | POA: Diagnosis not present

## 2022-08-30 DIAGNOSIS — Z6826 Body mass index (BMI) 26.0-26.9, adult: Secondary | ICD-10-CM

## 2022-08-30 DIAGNOSIS — Z806 Family history of leukemia: Secondary | ICD-10-CM | POA: Diagnosis not present

## 2022-08-30 DIAGNOSIS — E1121 Type 2 diabetes mellitus with diabetic nephropathy: Secondary | ICD-10-CM | POA: Diagnosis not present

## 2022-08-30 DIAGNOSIS — Z800 Family history of malignant neoplasm of digestive organs: Secondary | ICD-10-CM | POA: Diagnosis not present

## 2022-08-30 DIAGNOSIS — K3189 Other diseases of stomach and duodenum: Secondary | ICD-10-CM | POA: Diagnosis not present

## 2022-08-30 DIAGNOSIS — E1122 Type 2 diabetes mellitus with diabetic chronic kidney disease: Secondary | ICD-10-CM | POA: Diagnosis not present

## 2022-08-30 DIAGNOSIS — K5730 Diverticulosis of large intestine without perforation or abscess without bleeding: Secondary | ICD-10-CM | POA: Diagnosis not present

## 2022-08-30 DIAGNOSIS — E162 Hypoglycemia, unspecified: Secondary | ICD-10-CM

## 2022-08-30 DIAGNOSIS — K5791 Diverticulosis of intestine, part unspecified, without perforation or abscess with bleeding: Secondary | ICD-10-CM | POA: Diagnosis present

## 2022-08-30 DIAGNOSIS — K921 Melena: Secondary | ICD-10-CM | POA: Diagnosis not present

## 2022-08-30 LAB — CBC WITH DIFFERENTIAL/PLATELET
Abs Immature Granulocytes: 0.02 10*3/uL (ref 0.00–0.07)
Basophils Absolute: 0 10*3/uL (ref 0.0–0.1)
Basophils Relative: 0 %
Eosinophils Absolute: 0.2 10*3/uL (ref 0.0–0.5)
Eosinophils Relative: 2 %
HCT: 15.3 % — ABNORMAL LOW (ref 36.0–46.0)
Hemoglobin: 4.5 g/dL — CL (ref 12.0–15.0)
Immature Granulocytes: 0 %
Lymphocytes Relative: 30 %
Lymphs Abs: 2.6 10*3/uL (ref 0.7–4.0)
MCH: 29 pg (ref 26.0–34.0)
MCHC: 29.4 g/dL — ABNORMAL LOW (ref 30.0–36.0)
MCV: 98.7 fL (ref 80.0–100.0)
Monocytes Absolute: 0.6 10*3/uL (ref 0.1–1.0)
Monocytes Relative: 7 %
Neutro Abs: 5.3 10*3/uL (ref 1.7–7.7)
Neutrophils Relative %: 61 %
Platelets: 228 10*3/uL (ref 150–400)
RBC: 1.55 MIL/uL — ABNORMAL LOW (ref 3.87–5.11)
RDW: 14.4 % (ref 11.5–15.5)
WBC: 8.7 10*3/uL (ref 4.0–10.5)
nRBC: 0 % (ref 0.0–0.2)

## 2022-08-30 LAB — CBC
HCT: 14.4 % — CL (ref 36.0–46.0)
Hemoglobin: 4.3 g/dL — CL (ref 12.0–15.0)
MCH: 29.1 pg (ref 26.0–34.0)
MCHC: 29.9 g/dL — ABNORMAL LOW (ref 30.0–36.0)
MCV: 97.3 fL (ref 80.0–100.0)
Platelets: 233 10*3/uL (ref 150–400)
RBC: 1.48 MIL/uL — ABNORMAL LOW (ref 3.87–5.11)
RDW: 14.6 % (ref 11.5–15.5)
WBC: 9.3 10*3/uL (ref 4.0–10.5)
nRBC: 0 % (ref 0.0–0.2)

## 2022-08-30 LAB — IRON AND TIBC
Iron: 86 ug/dL (ref 28–170)
Saturation Ratios: 33 % — ABNORMAL HIGH (ref 10.4–31.8)
TIBC: 262 ug/dL (ref 250–450)
UIBC: 176 ug/dL

## 2022-08-30 LAB — ABO/RH: ABO/RH(D): O NEG

## 2022-08-30 LAB — COMPREHENSIVE METABOLIC PANEL
ALT: 11 U/L (ref 0–44)
AST: 19 U/L (ref 15–41)
Albumin: 2.8 g/dL — ABNORMAL LOW (ref 3.5–5.0)
Alkaline Phosphatase: 62 U/L (ref 38–126)
Anion gap: 6 (ref 5–15)
BUN: 46 mg/dL — ABNORMAL HIGH (ref 8–23)
CO2: 17 mmol/L — ABNORMAL LOW (ref 22–32)
Calcium: 8.1 mg/dL — ABNORMAL LOW (ref 8.9–10.3)
Chloride: 114 mmol/L — ABNORMAL HIGH (ref 98–111)
Creatinine, Ser: 3.67 mg/dL — ABNORMAL HIGH (ref 0.44–1.00)
GFR, Estimated: 12 mL/min — ABNORMAL LOW (ref >60)
Glucose, Bld: 131 mg/dL — ABNORMAL HIGH (ref 70–99)
Potassium: 4.9 mmol/L (ref 3.5–5.1)
Sodium: 137 mmol/L (ref 135–145)
Total Bilirubin: 0.4 mg/dL (ref 0.3–1.2)
Total Protein: 5.9 g/dL — ABNORMAL LOW (ref 6.5–8.1)

## 2022-08-30 LAB — RETICULOCYTES
Immature Retic Fract: 27 % — ABNORMAL HIGH (ref 2.3–15.9)
RBC.: 1.5 MIL/uL — ABNORMAL LOW (ref 3.87–5.11)
Retic Count, Absolute: 44.1 10*3/uL (ref 19.0–186.0)
Retic Ct Pct: 2.9 % (ref 0.4–3.1)

## 2022-08-30 LAB — FOLATE: Folate: >40 ng/mL (ref >5.9)

## 2022-08-30 LAB — GLUCOSE, CAPILLARY: Glucose-Capillary: 115 mg/dL — ABNORMAL HIGH (ref 70–99)

## 2022-08-30 LAB — FERRITIN: Ferritin: 38 ng/mL (ref 11–307)

## 2022-08-30 MED ORDER — SODIUM BICARBONATE 650 MG PO TABS
650.0000 mg | ORAL_TABLET | Freq: Two times a day (BID) | ORAL | Status: DC
  Administered 2022-08-30 – 2022-09-05 (×11): 650 mg via ORAL
  Filled 2022-08-30 (×13): qty 1

## 2022-08-30 MED ORDER — ACETAMINOPHEN 325 MG PO TABS: 650.0000 mg | ORAL_TABLET | Freq: Four times a day (QID) | ORAL | Status: DC | PRN

## 2022-08-30 MED ORDER — INSULIN ASPART 100 UNIT/ML IJ SOLN: 0.0000 [IU] | Freq: Every day | INTRAMUSCULAR | Status: DC

## 2022-08-30 MED ORDER — PEG 3350-KCL-NA BICARB-NACL 420 G PO SOLR
4000.0000 mL | Freq: Once | ORAL | Status: AC
Start: 2022-08-30 — End: 2022-08-31
  Administered 2022-08-31: 4000 mL via ORAL
  Filled 2022-08-30: qty 4000

## 2022-08-30 MED ORDER — AMLODIPINE BESYLATE 10 MG PO TABS
10.0000 mg | ORAL_TABLET | Freq: Every day | ORAL | Status: DC
  Administered 2022-08-31 – 2022-09-05 (×6): 10 mg via ORAL
  Filled 2022-08-30 (×6): qty 1

## 2022-08-30 MED ORDER — ONDANSETRON HCL 4 MG/2ML IJ SOLN: 4.0000 mg | Freq: Three times a day (TID) | INTRAMUSCULAR | Status: DC | PRN

## 2022-08-30 MED ORDER — INSULIN GLARGINE-YFGN 100 UNIT/ML ~~LOC~~ SOLN
15.0000 [IU] | Freq: Every day | SUBCUTANEOUS | Status: DC
Start: 2022-08-30 — End: 2022-09-02
  Administered 2022-08-30 – 2022-08-31 (×2): 15 [IU] via SUBCUTANEOUS
  Filled 2022-08-30 (×3): qty 0.15

## 2022-08-30 MED ORDER — FERROUS SULFATE 325 (65 FE) MG PO TABS
325.0000 mg | ORAL_TABLET | Freq: Every day | ORAL | Status: DC
  Administered 2022-08-31 – 2022-09-05 (×6): 325 mg via ORAL
  Filled 2022-08-30 (×6): qty 1

## 2022-08-30 MED ORDER — SODIUM CHLORIDE 0.9 % IV SOLN
10.0000 mL/h | Freq: Once | INTRAVENOUS | Status: AC
  Administered 2022-08-30: 10 mL/h via INTRAVENOUS

## 2022-08-30 MED ORDER — PANTOPRAZOLE SODIUM 40 MG IV SOLR
40.0000 mg | Freq: Two times a day (BID) | INTRAVENOUS | Status: DC
  Administered 2022-08-30: 40 mg via INTRAVENOUS
  Filled 2022-08-30: qty 10

## 2022-08-30 MED ORDER — HYDRALAZINE HCL 20 MG/ML IJ SOLN
5.0000 mg | INTRAMUSCULAR | Status: DC | PRN
Start: 2022-08-30 — End: 2022-09-05

## 2022-08-30 MED ORDER — INSULIN ASPART 100 UNIT/ML IJ SOLN
0.0000 [IU] | Freq: Three times a day (TID) | INTRAMUSCULAR | Status: DC
  Administered 2022-09-01: 2 [IU] via SUBCUTANEOUS
  Filled 2022-08-30: qty 1

## 2022-08-30 NOTE — ED Provider Notes (Signed)
[facility] Provider Note    Event Date/Time   First MD Initiated Contact with Patient 08/30/22 1642     (approximate)   History   Rectal Bleeding and Dizziness   HPI  Dana [name] is a 76 y.o. female who had been anemic and was taking iron pills and eating meat and drinking beet juice and then she began to get weak and lightheaded and had darker stools.  She had some bright red bloody bleeding.      Physical Exam   Triage Vital Signs: ED Triage Vitals  Enc Vitals Group     BP 08/30/22 1557 124/65     Pulse Rate 08/30/22 1557 88     Resp 08/30/22 1557 17     Temp 08/30/22 1557 98 F (36.7 C)     Temp Source 08/30/22 1557 Oral     SpO2 08/30/22 1557 100 %     Weight --      Height --      Head Circumference --      Peak Flow --      Pain Score 08/30/22 1558 0     Pain Loc --      Pain Edu? --      Excl. in [place]? --     Most recent vital signs: Vitals:   08/30/22 2353 08/31/22 0042  BP: 129/62 (!) 120/59  Pulse: 83 74  Resp: 20 20  Temp: 98.4 F (36.9 C) 98.5 F (36.9 C)  SpO2: 99% 97%     General: Awake, no distress.  Looks pale CV:  Good peripheral perfusion.  Heart regular rate and rhythm no audible murmurs Resp:  Normal effort.  Lungs are clear Abd:  No distention.  Soft and nontender Rectal: No masses are palpated the stool is maroon.   ED Results / Procedures / Treatments   Labs (all labs ordered are listed, but only abnormal results are displayed) Labs Reviewed  CBC WITH DIFFERENTIAL/PLATELET - Abnormal; Notable for the following components:      Result Value   RBC 1.55 (*)    Hemoglobin 4.5 (*)    HCT 15.3 (*)    MCHC 29.4 (*)    All other components within normal limits  COMPREHENSIVE METABOLIC PANEL - Abnormal; Notable for the following components:   Chloride 114 (*)    CO2 17 (*)    Glucose, Bld 131 (*)    BUN 46 (*)    Creatinine, Ser 3.67 (*)    Calcium 8.1 (*)    Total Protein 5.9 (*)    Albumin  2.8 (*)    GFR, Estimated 12 (*)    All other components within normal limits  CBC - Abnormal; Notable for the following components:   RBC 1.48 (*)    Hemoglobin 4.3 (*)    HCT 14.4 (*)    MCHC 29.9 (*)    All other components within normal limits  IRON AND TIBC - Abnormal; Notable for the following components:   Saturation Ratios 33 (*)    All other components within normal limits  RETICULOCYTES - Abnormal; Notable for the following components:   RBC. 1.50 (*)    Immature Retic Fract 27.0 (*)    All other components within normal limits  GLUCOSE, CAPILLARY - Abnormal; Notable for the following components:   Glucose-Capillary 115 (*)    All other components within normal limits  FOLATE  FERRITIN  CBC  CBC  BASIC METABOLIC PANEL  VITAMIN  B12  PROTIME-INR  APTT  CBC  CBC  TYPE AND SCREEN  PREPARE RBC (CROSSMATCH)  ABO/RH     EKG  EKG read interpreted by me shows normal sinus rhythm rate of 84 left axis no acute ST-T wave changes   RADIOLOGY    PROCEDURES:  Critical Care performed:   Procedures   MEDICATIONS ORDERED IN ED: Medications  pantoprazole (PROTONIX) injection 40 mg (40 mg Intravenous Given 08/30/22 2118)  ondansetron (ZOFRAN) injection 4 mg (has no administration in time range)  acetaminophen (TYLENOL) tablet 650 mg (has no administration in time range)  hydrALAZINE (APRESOLINE) injection 5 mg (has no administration in time range)  insulin aspart (novoLOG) injection 0-5 Units (0 Units Subcutaneous Not Given 08/30/22 2118)  insulin aspart (novoLOG) injection 0-9 Units (has no administration in time range)  amLODipine (NORVASC) tablet 10 mg (has no administration in time range)  sodium bicarbonate tablet 650 mg (650 mg Oral Given 08/30/22 2117)  ferrous sulfate tablet 325 mg (has no administration in time range)  insulin glargine-yfgn (SEMGLEE) injection 15 Units (15 Units Subcutaneous Given 08/30/22 2154)  0.9 %  sodium chloride infusion (0 mL/hr  Intravenous Stopped 08/30/22 1824)  polyethylene glycol-electrolytes (NuLYTELY) solution 4,000 mL (4,000 mLs Oral Given 08/31/22 0120)     IMPRESSION / MDM / ASSESSMENT AND PLAN / ED COURSE  I reviewed the triage vital signs and the nursing notes. ----------------------------------------- 5:13 PM on 08/30/2022 ----------------------------------------- Discussed patient with Dr. Haig Prophet GI who will see her tomorrow.  We will plan on getting her in the hospital and transfusing her.  She will need a colonoscope to make sure that t source of the maroon stools is found  Differential diagnosis includes, but is not limited to, upper GI bleed which is severe or vascular ectatic mass in the wall of the colon or polyp or diverticulosis or cancer all could cause bleeding is as could hemorrhoids but hemorrhoid should not cause maroon stools I did not palpate any hemorrhoids  Patient's presentation is most consistent with acute presentation with potential threat to life or bodily function.  The patient is on the cardiac monitor to evaluate for evidence of arrhythmia and/or significant heart rate changes none are seen  Discussed with patient risk factors for transfusion patient agrees to transfusion I will begin this.     FINAL CLINICAL IMPRESSION(S) / ED DIAGNOSES   Final diagnoses:  Gastrointestinal hemorrhage, unspecified gastrointestinal hemorrhage type  Symptomatic anemia     Rx / DC Orders   ED Discharge Orders     None        Note:  This document was prepared using Dragon voice recognition software and may include unintentional dictation errors.   Nena Polio, MD 08/31/22 [phone]

## 2022-08-30 NOTE — Assessment & Plan Note (Addendum)
Recent baseline creatinine 2.5-3.0.  Creatinine on admission at 3.67 Secondary to acute blood loss anemia.  Improving.  Down to 3.36

## 2022-08-30 NOTE — Assessment & Plan Note (Addendum)
GI consulted.  Status post colonoscopy noting extensive diverticulosis of blood throughout colon.  Minimal residual bleeding.  Patient's hemoglobin below 7 on 9/14 although likely she had was lower than previously thought and this was more from hemoconcentration.  Transfusing additional unit of blood on 9/14.

## 2022-08-30 NOTE — Progress Notes (Signed)
Blood bank brought RH positive and they indicated it's safe for her since she's not in her child birth age. Patient refused to take the blood. Blood returned to returned to blood bank to exchange fpr [place] negative.

## 2022-08-30 NOTE — Assessment & Plan Note (Addendum)
As above.-Continue iron supplement

## 2022-08-30 NOTE — ED Notes (Signed)
Lab called with critical Hgb 4.5

## 2022-08-30 NOTE — H&P (Signed)
History and Physical    Dana [name] PPI:[id number] DOB: [birth year] DOA: 08/30/2022  Referring MD/NP/PA:   PCP: Dana Lighter, MD   Patient coming from:  The patient is coming from home.  At baseline, pt is independent for most of ADL.        Chief Complaint: Rectal bleeding  HPI: Dana [name] is a 76 y.o. [name] with medical history significant of hypertension, hyperlipidemia, diabetes mellitus, CKD-5, anemia, kidney stone, who presents with rectal bleeding.  Patient states that she started having melena since Thursday, yesterday she noticed bright red blood.  She has at least 7 times of rectal bleeding.  She has nausea, no vomiting, diarrhea or abdominal pain.  No symptoms of UTI.  Denies chest pain, cough, shortness breath.  No fever or chills.  She has not had any history no fall.  Data reviewed independently and ED Course: pt was found to have hemoglobin 4.5 (8.1 on 03/30/2022), WBC 8.7, slightly worsening renal function, temperature normal, blood pressure 124/65, heart rate 88, RR 17, oxygen saturation 100% on room air.  Patient is admitted to PCU as inpatient.  Dr. Haig Prophet of GI and Dr. Clayton Bibles of renal were consulted.   EKG: I have personally reviewed.  Sinus rhythm, QTc 444, LAE, LAD, poor IV progression.  Review of Systems:   General: no fevers, chills, no body weight gain, has fatigue HEENT: no blurry vision, hearing changes or sore throat Respiratory: no dyspnea, coughing, wheezing CV: no chest pain, no palpitations GI: has nausea, no vomiting, abdominal pain, diarrhea, constipation. Has rectal bleeding GU: no dysuria, burning on urination, increased urinary frequency, hematuria  Ext: no leg edema Neuro: no unilateral weakness, numbness, or tingling, no vision change or hearing loss Skin: no rash, no skin tear. MSK: No muscle spasm, no deformity, no limitation of range of movement in spin Heme: No easy bruising.  Travel history: No recent long distant  travel.   Allergy:  Allergies  Allergen Reactions   Clindamycin Dermatitis and Rash   Clindamycin/Lincomycin Rash   Chocolate     Nosebleed   Chocolate Flavor      Nosebleed   Food Color Red     Other reaction(s): Unknown Other reaction(s): Unknown   Food Color Yellow Other (See Comments)    Other reaction(s): Unknown Other reaction(s): Unknown Other reaction(s): Unknown Other reaction(s): Unknown Other reaction(s): Unknown    Past Medical History:  Diagnosis Date   Anemia    Arthritis    Chronic kidney disease    DM (diabetes mellitus), type 1 (HCC)    History of kidney stones    Hypertension    Peripheral vascular disease ([place])     Past Surgical History:  Procedure Laterality Date   ABDOMINAL HYSTERECTOMY     COLONOSCOPY     CYSTOSCOPY W/ RETROGRADES Right 04/08/2022   Procedure: CYSTOSCOPY WITH RETROGRADE PYELOGRAM;  Surgeon: Abbie Sons, MD;  Location: [facility] ORS;  Service: Urology;  Laterality: Right;   CYSTOSCOPY WITH STENT PLACEMENT Right 03/08/2022   Procedure: CYSTOSCOPY WITH RIGHT URETERALSTENT PLACEMENT;  Surgeon: Abbie Sons, MD;  Location: [facility] ORS;  Service: Urology;  Laterality: Right;   CYSTOSCOPY/URETEROSCOPY/HOLMIUM LASER/STENT PLACEMENT Right 04/08/2022   Procedure: CYSTOSCOPY/URETEROSCOPY/HOLMIUM LASER/STENT PLACEMENT;  Surgeon: Abbie Sons, MD;  Location: [facility] ORS;  Service: Urology;  Laterality: Right;   EYE SURGERY Left 11/28/2021   HYSTERECTOMY ABDOMINAL WITH SALPINGO-OOPHORECTOMY     URETEROSCOPY Right 03/08/2022   Procedure: URETEROSCOPY;  Surgeon: Abbie Sons, MD;  Location: [facility]  ORS;  Service: Urology;  Laterality: Right;    Social History:  reports that she has never smoked. She has never used smokeless tobacco. She reports that she does not drink alcohol and does not use drugs.  Family History:  Family History  Problem Relation Age of Onset   Diabetes Mother    Hypertension Father    Diabetes Sister    Diabetes  Sister    Leukemia Sister    Hypertension Sister    Diabetes Brother    Colon cancer Brother    Diabetes Brother    Diabetes Brother      Prior to Admission medications   Medication Sig Start Date End Date Taking? Authorizing Provider  acetaminophen (TYLENOL) 500 MG tablet Take 500 mg by mouth every 6 (six) hours as needed for mild pain.    [provider]  amLODipine (NORVASC) 10 MG tablet TAKE 1 TABLET BY MOUTH EVERY DAY 05/24/21   Isaac Bliss, Rayford Halsted, MD  aspirin 81 MG EC tablet Take 81 mg by mouth See admin instructions. Takes on Tues and Thur only due to history of eye bleed.    [provider]  B Complex-C (B-COMPLEX WITH VITAMIN C) tablet Take 1 tablet by mouth daily.    [provider]  Ferrous Sulfate 142 (45 Fe) MG TBCR Take 1 tablet by mouth every morning.    [provider]  hydrochlorothiazide (HYDRODIURIL) 25 MG tablet Take by mouth.    [provider]  insulin detemir (LEVEMIR FLEXTOUCH) 100 UNIT/ML FlexPen Inject 10 Units into the skin daily. 03/15/22   Nicole Kindred A, DO  JARDIANCE 10 MG TABS tablet Take 10 mg by mouth daily. 11/16/21   [provider]  Omega-3 Fatty Acids (FISH OIL PO) Take 1 capsule by mouth daily.    [provider]  sodium bicarbonate 650 MG tablet Take 1 tablet (650 mg total) by mouth 2 (two) times daily. 03/15/22   Ezekiel Slocumb, DO  telmisartan-hydrochlorothiazide (MICARDIS HCT) 40-12.5 MG tablet Take 1 tablet by mouth daily. 01/28/22   [provider]  Turmeric 500 MG TABS Take 500 mg by mouth daily at 6 (six) AM.    [provider]  vitamin C (ASCORBIC ACID) 500 MG tablet Take 500 mg by mouth 2 (two) times daily.    [provider]  VITAMIN D PO Take 2 tablets by mouth daily.    [provider]  zinc gluconate 50 MG tablet Take 1 tablet by mouth daily.    [provider]    Physical Exam: Vitals:   08/30/22 1557 08/30/22 1804   BP: 124/65 138/81  Pulse: 88 94  Resp: 17 18  Temp: 98 F (36.7 C) 98.3 F (36.8 C)  TempSrc: Oral Oral  SpO2: 100%    General: Not in acute distress. Pale looking. HEENT:       Eyes: PERRL, EOMI, no scleral icterus.       ENT: No discharge from the ears and nose, no pharynx injection, no tonsillar enlargement.        Neck: No JVD, no bruit, no mass felt. Heme: No neck lymph node enlargement. Cardiac: S1/S2, RRR, No murmurs, No gallops or rubs. Respiratory: No rales, wheezing, rhonchi or rubs. GI: Soft, nondistended, nontender, no rebound pain, no organomegaly, BS present. GU: No hematuria Ext: No pitting leg edema bilaterally. 1+DP/PT pulse bilaterally. Musculoskeletal: No joint deformities, No joint redness or warmth, no limitation of ROM in spin. Skin: No rashes.  Neuro: Alert, oriented X3, cranial nerves II-XII grossly intact, moves all extremities normally. Psych: Patient is not psychotic, no suicidal or hemocidal ideation.  Labs on Admission: I have personally reviewed following labs and imaging studies  CBC: Recent Labs  Lab 08/30/22 1600  WBC 9.3  8.7  NEUTROABS 5.3  HGB 4.3*  4.5*  HCT 14.4*  15.3*  MCV 97.3  98.7  PLT 233  093   Basic Metabolic Panel: Recent Labs  Lab 08/30/22 1600  NA 137  K 4.9  CL 114*  CO2 17*  GLUCOSE 131*  BUN 46*  CREATININE 3.67*  CALCIUM 8.1*   GFR: CrCl cannot be calculated (Unknown ideal weight.). Liver Function Tests: Recent Labs  Lab 08/30/22 1600  AST 19  ALT 11  ALKPHOS 62  BILITOT 0.4  PROT 5.9*  ALBUMIN 2.8*   No results for input(s): "LIPASE", "AMYLASE" in the last 168 hours. No results for input(s): "AMMONIA" in the last 168 hours. Coagulation Profile: No results for input(s): "INR", "PROTIME" in the last 168 hours. Cardiac Enzymes: No results for input(s): "CKTOTAL", "CKMB", "CKMBINDEX", "TROPONINI" in the last 168 hours. BNP (last 3 results) No results for input(s): "PROBNP" in the last 8760  hours. HbA1C: No results for input(s): "HGBA1C" in the last 72 hours. CBG: No results for input(s): "GLUCAP" in the last 168 hours. Lipid Profile: No results for input(s): "CHOL", "HDL", "LDLCALC", "TRIG", "CHOLHDL", "LDLDIRECT" in the last 72 hours. Thyroid Function Tests: No results for input(s): "TSH", "T4TOTAL", "FREET4", "T3FREE", "THYROIDAB" in the last 72 hours. Anemia Panel: No results for input(s): "VITAMINB12", "FOLATE", "FERRITIN", "TIBC", "IRON", "RETICCTPCT" in the last 72 hours. Urine analysis:    Component Value Date/Time   COLORURINE YELLOW (A) 03/30/2022 2130   APPEARANCEUR HAZY (A) 03/30/2022 2130   LABSPEC 1.012 03/30/2022 2130   PHURINE 6.0 03/30/2022 2130   GLUCOSEU 150 (A) 03/30/2022 2130   HGBUR SMALL (A) 03/30/2022 2130   BILIRUBINUR NEGATIVE 03/30/2022 2130   KETONESUR NEGATIVE 03/30/2022 2130   PROTEINUR >=300 (A) 03/30/2022 2130   NITRITE NEGATIVE 03/30/2022 2130   LEUKOCYTESUR SMALL (A) 03/30/2022 2130   Sepsis Labs: @LABRCNTIP (procalcitonin:4,lacticidven:4) )No results found for this or any previous visit (from the past 240 hour(s)).   Radiological Exams on Admission: No results found.    Assessment/Plan Principal Problem:   GI bleeding Active Problems:   Acute blood loss anemia (ABLA)   CKD (chronic kidney disease), stage V (HCC)   Type II diabetes mellitus with renal manifestations (HCC)   HTN (hypertension)   Assessment and Plan: * GI bleeding GI bleeding and acute blood loss anemia: Hemoglobin dropped from 8.1-4.5.  Currently hemodynamically stable. Consulted Dr. Haig Prophet for GI.  - will admitted to progressive bed as inpatient - transfuse 3 units of blood now - Start IV pantoprazole 40 mg bid - Zofran IV for nausea - Avoid NSAIDs and SQ heparin - Maintain IV access (2 large bore IVs if possible). - Monitor closely and follow q6h cbc, transfuse as necessary, if Hgb<7.0 - LaB: INR, PTT and type screen -- check anemia  panel  Acute blood loss anemia (ABLA) -see above -continue iron supplement  CKD (chronic kidney disease), stage V (HCC) Recent baseline creatinine 2.5-3.0.  Her creatinine is 3.67, BUN 46, potassium 4.9.  Consulted Dr. Joylene John of renal -Follow-up with BMP -Avoid using renal toxic medications.  Type II diabetes mellitus with renal manifestations (HCC) Recent A1c 7.1, poorly controlled.  Patient is taking Jardiance and Levemir 30 units daily -Glargine  insulin 15 units daily -Sliding scale insulin  HTN (hypertension) - IV hydralazine as needed -Continue amlodipine           DVT ppx: SCD  Code Status: Full code  Family Communication: Yes, patient's husband   at bed side.     Disposition Plan:  Anticipate discharge back to previous environment  Consults called:  Dr. Haig Prophet of GI and Dr. Clayton Bibles of renal were consulted.  Admission status and Level of care: Progressive:  as inpt        Dispo: The patient is from: Home              Anticipated d/c is to: Home              Anticipated d/c date is: 2 days              Patient currently is not medically stable to d/c.    Severity of Illness:  The appropriate patient status for this patient is INPATIENT. Inpatient status is judged to be reasonable and necessary in order to provide the required intensity of service to ensure the patient's safety. The patient's presenting symptoms, physical exam findings, and initial radiographic and laboratory data in the context of their chronic comorbidities is felt to place them at high risk for further clinical deterioration. Furthermore, it is not anticipated that the patient will be medically stable for discharge from the hospital within 2 midnights of admission.   * I certify that at the point of admission it is my clinical judgment that the patient will require inpatient hospital care spanning beyond 2 midnights from the point of admission due to high intensity of service, high  risk for further deterioration and high frequency of surveillance required.*       Date of Service 08/30/2022    Ivor Costa [facility]   If 7PM-7AM, please contact night-coverage [email or web address] 08/30/2022, 6:30 PM

## 2022-08-30 NOTE — ED Triage Notes (Signed)
Pt presents to ED with c/o of melena. Pt states when she had a BM it is bright red, pt states she did eat beets and thought it may possibly be that. Pt unsure if she has internal hemroids.    Pt states this started this past Friday.  Pt denies blood thinner use.

## 2022-08-30 NOTE — Assessment & Plan Note (Addendum)
Blood pressure has been trending upward, likely due to additional volume from blood.  I have ordered some additional Lasix after blood today.  Home medications plus as needed hydralazine.

## 2022-08-30 NOTE — Consult Note (Signed)
Consultation  Referring Provider:     ED/Hospitalist Admit date: 9/9 Consult date: 9/9         Reason for Consultation:     Hematochezia         HPI:   Dana [name] is a 76 y.o. lady with CKD 4-5, anemia of chornic disease, DM II, hypertension, and PVD here for hematochezia that started Thursday. States it initially started as maroon and then with every bowel movement it was bright red. Her last colonoscopy was 5 years ago and reportedly normal. States she had an EGD in the area but I do not see a record of this. No blood thinners besides aspirin. No NSAIDS. Takes iron supplements. Her brother had colon cancer in his 60's. No abdominal pain.  Past Medical History:  Diagnosis Date   Anemia    Arthritis    Chronic kidney disease    DM (diabetes mellitus), type 1 (HCC)    History of kidney stones    Hypertension    Peripheral vascular disease ([place])     Past Surgical History:  Procedure Laterality Date   ABDOMINAL HYSTERECTOMY     COLONOSCOPY     CYSTOSCOPY W/ RETROGRADES Right 04/08/2022   Procedure: CYSTOSCOPY WITH RETROGRADE PYELOGRAM;  Surgeon: Abbie Sons, MD;  Location: [facility];  Service: Urology;  Laterality: Right;   CYSTOSCOPY WITH STENT PLACEMENT Right 03/08/2022   Procedure: CYSTOSCOPY WITH RIGHT URETERALSTENT PLACEMENT;  Surgeon: Abbie Sons, MD;  Location: [facility];  Service: Urology;  Laterality: Right;   CYSTOSCOPY/URETEROSCOPY/HOLMIUM LASER/STENT PLACEMENT Right 04/08/2022   Procedure: CYSTOSCOPY/URETEROSCOPY/HOLMIUM LASER/STENT PLACEMENT;  Surgeon: Abbie Sons, MD;  Location: [facility];  Service: Urology;  Laterality: Right;   EYE SURGERY Left 11/28/2021   HYSTERECTOMY ABDOMINAL WITH SALPINGO-OOPHORECTOMY     URETEROSCOPY Right 03/08/2022   Procedure: URETEROSCOPY;  Surgeon: Abbie Sons, MD;  Location: [facility];  Service: Urology;  Laterality: Right;    Family History  Problem Relation Age of Onset   Diabetes Mother    Hypertension Father     Diabetes Sister    Diabetes Sister    Leukemia Sister    Hypertension Sister    Diabetes Brother    Colon cancer Brother    Diabetes Brother    Diabetes Brother     Social History   Tobacco Use   Smoking status: Never   Smokeless tobacco: Never  Vaping Use   Vaping Use: Never used  Substance Use Topics   Alcohol use: Never   Drug use: Never    Prior to Admission medications   Medication Sig Start Date End Date Taking? Authorizing Provider  acetaminophen (TYLENOL) 500 MG tablet Take 500 mg by mouth every 6 (six) hours as needed for mild pain.   Yes [provider]  amLODipine (NORVASC) 10 MG tablet TAKE 1 TABLET BY MOUTH EVERY DAY 05/24/21  Yes Isaac Bliss, Rayford Halsted, MD  aspirin 81 MG EC tablet Take 81 mg by mouth See admin instructions. Takes on Tues and Thur only due to history of eye bleed.   Yes [provider]  B Complex-C (B-COMPLEX WITH VITAMIN C) tablet Take 1 tablet by mouth daily.   Yes [provider]  Ferrous Sulfate 142 (45 Fe) MG TBCR Take 1 tablet by mouth every morning.   Yes [provider]  insulin detemir (LEVEMIR FLEXTOUCH) 100 UNIT/ML FlexPen Inject 10 Units into the skin daily. Patient taking differently: Inject 30 Units into the skin daily. 03/15/22  Yes Nicole Kindred A, DO  JARDIANCE 10 MG TABS tablet Take 10 mg by mouth daily. 11/16/21  Yes [provider]  Omega-3 Fatty Acids (FISH OIL PO) Take 1 capsule by mouth daily.   Yes [provider]  sodium bicarbonate 650 MG tablet Take 1 tablet (650 mg total) by mouth 2 (two) times daily. 03/15/22  Yes Nicole Kindred A, DO  Turmeric 500 MG TABS Take 500 mg by mouth daily at 6 (six) AM.   Yes [provider]  vitamin C (ASCORBIC ACID) 500 MG tablet Take 500 mg by mouth 2 (two) times daily.   Yes [provider]  VITAMIN D PO Take 2 tablets by mouth daily.   Yes [provider]  zinc gluconate 50 MG tablet Take 1 tablet by mouth  daily.   Yes [provider]  hydrochlorothiazide (HYDRODIURIL) 25 MG tablet Take by mouth. Patient not taking: Reported on 08/30/2022    [provider]  telmisartan-hydrochlorothiazide (MICARDIS HCT) 40-12.5 MG tablet Take 1 tablet by mouth daily. Patient not taking: Reported on 08/30/2022 01/28/22   [provider]    Current Facility-Administered Medications  Medication Dose Route Frequency Provider Last Rate Last Admin   acetaminophen (TYLENOL) tablet 650 mg  650 mg Oral Q6H PRN Ivor Costa, MD       [START ON 08/31/2022] amLODipine (NORVASC) tablet 10 mg  10 mg Oral Daily Ivor Costa, MD       Derrill Memo ON 08/31/2022] ferrous sulfate tablet 325 mg  325 mg Oral Q breakfast Ivor Costa, MD       hydrALAZINE (APRESOLINE) injection 5 mg  5 mg Intravenous Q2H PRN Ivor Costa, MD       insulin aspart (novoLOG) injection 0-5 Units  0-5 Units Subcutaneous QHS Ivor Costa, MD       [START ON 08/31/2022] insulin aspart (novoLOG) injection 0-9 Units  0-9 Units Subcutaneous TID WC Ivor Costa, MD       insulin glargine-yfgn (SEMGLEE) injection 15 Units  15 Units Subcutaneous QHS Ivor Costa, MD   15 Units at 08/30/22 2154   ondansetron (ZOFRAN) injection 4 mg  4 mg Intravenous Q8H PRN Ivor Costa, MD       pantoprazole (PROTONIX) injection 40 mg  40 mg Intravenous Q12H Ivor Costa, MD   40 mg at 08/30/22 2118   sodium bicarbonate tablet 650 mg  650 mg Oral BID Ivor Costa, MD   650 mg at 08/30/22 2117    Allergies as of 08/30/2022 - Review Complete 08/30/2022  Allergen Reaction Noted   Clindamycin Dermatitis and Rash 01/20/2019   Clindamycin/lincomycin Rash 12/02/2019   Chocolate  03/24/2015   Chocolate flavor  03/24/2015   Food color red  06/28/2021   Food color yellow Other (See Comments) 06/28/2021     Review of Systems:    All systems reviewed and negative except where noted in HPI.  Review of Systems  Constitutional:  Negative for chills and fever.  Respiratory:  Negative for  shortness of breath.   Cardiovascular:  Negative for chest pain.  Gastrointestinal:  Positive for blood in stool and nausea. Negative for abdominal pain, constipation, diarrhea and vomiting.  Musculoskeletal:  Negative for back pain and joint pain.  Skin:  Negative for rash.  Neurological:  Negative for focal weakness.  Psychiatric/Behavioral:  Negative for substance abuse.   All other systems reviewed and are negative.     Physical Exam:  Vital signs in last 24 hours: Temp:  Reina.Alexander  F (36.7 C)-99 F (37.2 C)] 98.6 F (37 C) (09/09 2206) Pulse Rate:  [82-94] 82 (09/09 2206) Resp:  [16-36] 20 (09/09 2206) BP: (124-167)/(55-81) 139/55 (09/09 2206) SpO2:  [100 %] 100 % (09/09 2206) Weight:  [65.7 kg] 65.7 kg (09/09 2032)   General:   Pleasant in NAD Head:  Normocephalic and atraumatic. Eyes:   No icterus.   Conjunctiva pink. Neck:  Supple; no masses felt Lungs:  No respiratory distress Abdomen:   Flat, soft, nondistended, nontender Msk:  MAEW x4, No clubbing or cyanosis. Strength 5/5. Symmetrical without gross deformities. Neurologic:  Alert and  oriented x4;  Cranial nerves II-XII intact.  Skin:  Warm, dry, pink without significant lesions or rashes. Psych:  Alert and cooperative. Normal affect.  LAB RESULTS: Recent Labs    08/30/22 1600  WBC 9.3  8.7  HGB 4.3*  4.5*  HCT 14.4*  15.3*  PLT 233  228   BMET Recent Labs    08/30/22 1600  NA 137  K 4.9  CL 114*  CO2 17*  GLUCOSE 131*  BUN 46*  CREATININE 3.67*  CALCIUM 8.1*   LFT Recent Labs    08/30/22 1600  PROT 5.9*  ALBUMIN 2.8*  AST 19  ALT 11  ALKPHOS 62  BILITOT 0.4   PT/INR No results for input(s): "LABPROT", "INR" in the last 72 hours.  STUDIES: No results found.     Impression / Plan:   76 y/o lady with CKD 4-5 here with hematochezia likely secondary to diverticular bleeding  - two large bore IV's - transfuse to kepp hemoglobin > 7 - trend CBC's - will plan for EGD/Colonoscopy  tomorrow - clear liquids and NPO order has been placed - consider nephrology consult to question if/when preparations for dialysis need to be made  Raylene Miyamoto MD, MPH [facility]

## 2022-08-30 NOTE — Assessment & Plan Note (Addendum)
Recent A1c 7.1, poorly controlled.  Patient is taking Jardiance and Levemir 30 units daily.  Sliding scale discontinued after patient started having episodes of hypoglycemia.  During hospitalization, even after being placed on p.o., has not required any insulin here.  Adjusting for dietary indiscretion at home, have decreased Lantus to 15 units subcu daily with patient's given strict instructions to check her sugar several times a day (currently she is checking it several times a week) and if her CBGs are stable on 15 units to continue.  If they are starting to trend upward, slowly increase and if CBGs are low, decrease Lantus.

## 2022-08-31 ENCOUNTER — Encounter: Payer: Self-pay | Admitting: Internal Medicine

## 2022-08-31 ENCOUNTER — Inpatient Hospital Stay: Payer: No Typology Code available for payment source | Admitting: Certified Registered"

## 2022-08-31 ENCOUNTER — Encounter
Admission: EM | Disposition: A | Discharge status: Home / Self Care | Payer: Self-pay | Source: Home / Self Care | Attending: Internal Medicine

## 2022-08-31 DIAGNOSIS — D62 Acute posthemorrhagic anemia: Secondary | ICD-10-CM | POA: Diagnosis not present

## 2022-08-31 DIAGNOSIS — N185 Chronic kidney disease, stage 5: Secondary | ICD-10-CM | POA: Diagnosis not present

## 2022-08-31 DIAGNOSIS — K921 Melena: Secondary | ICD-10-CM | POA: Diagnosis not present

## 2022-08-31 DIAGNOSIS — K3189 Other diseases of stomach and duodenum: Secondary | ICD-10-CM | POA: Diagnosis not present

## 2022-08-31 DIAGNOSIS — K920 Hematemesis: Secondary | ICD-10-CM | POA: Diagnosis not present

## 2022-08-31 DIAGNOSIS — K5730 Diverticulosis of large intestine without perforation or abscess without bleeding: Secondary | ICD-10-CM | POA: Diagnosis not present

## 2022-08-31 DIAGNOSIS — D649 Anemia, unspecified: Secondary | ICD-10-CM | POA: Diagnosis not present

## 2022-08-31 DIAGNOSIS — N179 Acute kidney failure, unspecified: Secondary | ICD-10-CM | POA: Diagnosis not present

## 2022-08-31 DIAGNOSIS — K922 Gastrointestinal hemorrhage, unspecified: Secondary | ICD-10-CM | POA: Diagnosis not present

## 2022-08-31 HISTORY — PX: ESOPHAGOGASTRODUODENOSCOPY: SHX5428

## 2022-08-31 HISTORY — PX: COLONOSCOPY: SHX5424

## 2022-08-31 LAB — CBC
HCT: 22.2 % — ABNORMAL LOW (ref 36.0–46.0)
HCT: 22.5 % — ABNORMAL LOW (ref 36.0–46.0)
HCT: 21.4 % — ABNORMAL LOW (ref 36.0–46.0)
Hemoglobin: 7.3 g/dL — ABNORMAL LOW (ref 12.0–15.0)
Hemoglobin: 7.4 g/dL — ABNORMAL LOW (ref 12.0–15.0)
Hemoglobin: 7.1 g/dL — ABNORMAL LOW (ref 12.0–15.0)
MCH: 28.9 pg (ref 26.0–34.0)
MCH: 28.8 pg (ref 26.0–34.0)
MCH: 29.2 pg (ref 26.0–34.0)
MCHC: 32.9 g/dL (ref 30.0–36.0)
MCHC: 32.9 g/dL (ref 30.0–36.0)
MCHC: 33.2 g/dL (ref 30.0–36.0)
MCV: 87.7 fL (ref 80.0–100.0)
MCV: 87.5 fL (ref 80.0–100.0)
MCV: 88.1 fL (ref 80.0–100.0)
Platelets: 198 10*3/uL (ref 150–400)
Platelets: 205 10*3/uL (ref 150–400)
Platelets: 153 10*3/uL (ref 150–400)
RBC: 2.53 MIL/uL — ABNORMAL LOW (ref 3.87–5.11)
RBC: 2.57 MIL/uL — ABNORMAL LOW (ref 3.87–5.11)
RBC: 2.43 MIL/uL — ABNORMAL LOW (ref 3.87–5.11)
RDW: 15.6 % — ABNORMAL HIGH (ref 11.5–15.5)
RDW: 16.3 % — ABNORMAL HIGH (ref 11.5–15.5)
RDW: 16.4 % — ABNORMAL HIGH (ref 11.5–15.5)
WBC: 8.4 10*3/uL (ref 4.0–10.5)
WBC: 7.5 10*3/uL (ref 4.0–10.5)
WBC: 8.4 10*3/uL (ref 4.0–10.5)
nRBC: 0 % (ref 0.0–0.2)
nRBC: 0 % (ref 0.0–0.2)
nRBC: 0 % (ref 0.0–0.2)

## 2022-08-31 LAB — BASIC METABOLIC PANEL
Anion gap: 5 (ref 5–15)
BUN: 42 mg/dL — ABNORMAL HIGH (ref 8–23)
CO2: 19 mmol/L — ABNORMAL LOW (ref 22–32)
Calcium: 8 mg/dL — ABNORMAL LOW (ref 8.9–10.3)
Chloride: 117 mmol/L — ABNORMAL HIGH (ref 98–111)
Creatinine, Ser: 3.59 mg/dL — ABNORMAL HIGH (ref 0.44–1.00)
GFR, Estimated: 13 mL/min — ABNORMAL LOW (ref >60)
Glucose, Bld: 88 mg/dL (ref 70–99)
Potassium: 4.5 mmol/L (ref 3.5–5.1)
Sodium: 141 mmol/L (ref 135–145)

## 2022-08-31 LAB — PROTIME-INR
INR: 1.3 — ABNORMAL HIGH (ref 0.8–1.2)
Prothrombin Time: 16.3 seconds — ABNORMAL HIGH (ref 11.4–15.2)

## 2022-08-31 LAB — GLUCOSE, CAPILLARY
Glucose-Capillary: 82 mg/dL (ref 70–99)
Glucose-Capillary: 100 mg/dL — ABNORMAL HIGH (ref 70–99)
Glucose-Capillary: 90 mg/dL (ref 70–99)

## 2022-08-31 LAB — VITAMIN B12: Vitamin B-12: 887 pg/mL (ref 180–914)

## 2022-08-31 LAB — APTT: aPTT: 36 seconds (ref 24–36)

## 2022-08-31 SURGERY — EGD (ESOPHAGOGASTRODUODENOSCOPY)
Anesthesia: General

## 2022-08-31 MED ORDER — PROPOFOL 10 MG/ML IV BOLUS
INTRAVENOUS | Status: DC | PRN
  Administered 2022-08-31: 20 mg via INTRAVENOUS
  Administered 2022-08-31: 40 mg via INTRAVENOUS

## 2022-08-31 MED ORDER — SODIUM CHLORIDE 0.9 % IV SOLN: INTRAVENOUS | Status: DC

## 2022-08-31 MED ORDER — LIDOCAINE HCL (PF) 2 % IJ SOLN
INTRAMUSCULAR | Status: AC
  Filled 2022-08-31: qty 5

## 2022-08-31 MED ORDER — PROPOFOL 500 MG/50ML IV EMUL
INTRAVENOUS | Status: DC | PRN
  Administered 2022-08-31: 100 ug/kg/min via INTRAVENOUS

## 2022-08-31 NOTE — Op Note (Signed)
[facility] Gastroenterology Patient Name: Dana [name] Procedure Date: 08/31/2022 10:35 AM MRN: [id number] Account #: [id number] Date of Birth: 19-Apr-1946 Admit Type: Inpatient Age: 76 Room: [facility] ENDO ROOM 4 Gender: Female Note Status: Finalized Instrument Name: Michaelle Birks [id number] Procedure:             Upper GI endoscopy Indications:           Hematochezia Providers:             Andrey Farmer MD, MD Medicines:             Monitored Anesthesia Care Complications:         No immediate complications. Procedure:             Pre-Anesthesia Assessment:                        - Prior to the procedure, a History and Physical was                         performed, and patient medications and allergies were                         reviewed. The patient is competent. The risks and                         benefits of the procedure and the sedation options and                         risks were discussed with the patient. All questions                         were answered and informed consent was obtained.                         Patient identification and proposed procedure were                         verified by the physician, the nurse, the                         anesthesiologist, the anesthetist and the technician                         in the endoscopy suite. Mental Status Examination:                         alert and oriented. Airway Examination: normal                         oropharyngeal airway and neck mobility. Respiratory                         Examination: clear to auscultation. CV Examination:                         normal. Prophylactic Antibiotics: The patient does not                         require prophylactic antibiotics. Prior  Anticoagulants: The patient has taken no previous                         anticoagulant or antiplatelet agents. ASA Grade                         Assessment: III - A patient with severe  systemic                         disease. After reviewing the risks and benefits, the                         patient was deemed in satisfactory condition to                         undergo the procedure. The anesthesia plan was to use                         monitored anesthesia care (MAC). Immediately prior to                         administration of medications, the patient was                         re-assessed for adequacy to receive sedatives. The                         heart rate, respiratory rate, oxygen saturations,                         blood pressure, adequacy of pulmonary ventilation, and                         response to care were monitored throughout the                         procedure. The physical status of the patient was                         re-assessed after the procedure.                        After obtaining informed consent, the endoscope was                         passed under direct vision. Throughout the procedure,                         the patient's blood pressure, pulse, and oxygen                         saturations were monitored continuously. The Endoscope                         was introduced through the mouth, and advanced to the                         second part of duodenum. The upper GI endoscopy was  accomplished without difficulty. The patient tolerated                         the procedure well. Findings:      The examined esophagus was normal.      The entire examined stomach was normal.      Patchy mildly erythematous mucosa without active bleeding and with no       stigmata of bleeding was found in the duodenal bulb. Impression:            - Normal esophagus.                        - Normal stomach.                        - Erythematous duodenopathy.                        - No specimens collected. Recommendation:        - Perform a colonoscopy today. Procedure Code(s):     --- Professional ---                         [phone], Esophagogastroduodenoscopy, flexible,                         transoral; diagnostic, including collection of                         specimen(s) by brushing or washing, when performed                         (separate procedure) Diagnosis Code(s):     --- Professional ---                        K31.89, Other diseases of stomach and duodenum                        K92.1, Melena (includes Hematochezia) CPT copyright 2019 American Medical Association. All rights reserved. The codes documented in this report are preliminary and upon coder review may  be revised to meet current compliance requirements. Andrey Farmer MD, MD 08/31/2022 11:24:57 AM Number of Addenda: 0 Note Initiated On: 08/31/2022 10:35 AM Estimated Blood Loss:  Estimated blood loss: none.      [facility]

## 2022-08-31 NOTE — Op Note (Signed)
[facility] Gastroenterology Patient Name: Dana [name] Procedure Date: 08/31/2022 10:34 AM MRN: [id number] Account #: [id number] Date of Birth: August 11, 1946 Admit Type: Inpatient Age: 76 Room: [facility] ENDO ROOM 4 Gender: Female Note Status: Finalized Instrument Name: Jasper Riling [id number] Procedure:             Colonoscopy Indications:           Hematochezia Providers:             Andrey Farmer MD, MD Medicines:             Monitored Anesthesia Care Complications:         No immediate complications. Procedure:             Pre-Anesthesia Assessment:                        - Prior to the procedure, a History and Physical was                         performed, and patient medications and allergies were                         reviewed. The patient is competent. The risks and                         benefits of the procedure and the sedation options and                         risks were discussed with the patient. All questions                         were answered and informed consent was obtained.                         Patient identification and proposed procedure were                         verified by the physician, the nurse, the                         anesthesiologist, the anesthetist and the technician                         in the endoscopy suite. Mental Status Examination:                         alert and oriented. Airway Examination: normal                         oropharyngeal airway and neck mobility. Respiratory                         Examination: clear to auscultation. CV Examination:                         normal. Prophylactic Antibiotics: The patient does not                         require prophylactic antibiotics. Prior  Anticoagulants: The patient has taken no previous                         anticoagulant or antiplatelet agents. ASA Grade                         Assessment: III - A patient with severe systemic                          disease. After reviewing the risks and benefits, the                         patient was deemed in satisfactory condition to                         undergo the procedure. The anesthesia plan was to use                         monitored anesthesia care (MAC). Immediately prior to                         administration of medications, the patient was                         re-assessed for adequacy to receive sedatives. The                         heart rate, respiratory rate, oxygen saturations,                         blood pressure, adequacy of pulmonary ventilation, and                         response to care were monitored throughout the                         procedure. The physical status of the patient was                         re-assessed after the procedure.                        After obtaining informed consent, the colonoscope was                         passed under direct vision. Throughout the procedure,                         the patient's blood pressure, pulse, and oxygen                         saturations were monitored continuously. The                         Colonoscope was introduced through the anus and                         advanced to the the terminal ileum. The colonoscopy  was performed without difficulty. The patient                         tolerated the procedure well. The quality of the bowel                         preparation was fair. Findings:      The perianal and digital rectal examinations were normal.      The terminal ileum appeared normal.      Hematin (altered blood/coffee-ground-like material) was found in the       entire colon.      Many small-mouthed diverticula were found in the sigmoid colon,       descending colon, hepatic flexure and ascending colon.      Retroflexion in the rectum was not performed due to laxity of the anus       and inability to achieve adequate insufflation. No obvious  lesions seen       on slow withdrawal. Impression:            - Preparation of the colon was fair.                        - The examined portion of the ileum was normal.                        - Blood in the entire examined colon.                        - Diverticulosis in the sigmoid colon, in the                         descending colon, at the hepatic flexure and in the                         ascending colon. Given no blood in the TI and                         extensive diverticulosis, this was likely a                         diverticular bleed that has resolved.                        - No specimens collected. Recommendation:        - Return patient to hospital ward for ongoing care.                        - Advance diet as tolerated.                        - Continue present medications. Procedure Code(s):     --- Professional ---                        [phone], Colonoscopy, flexible; diagnostic, including                         collection of specimen(s) by brushing or washing, when  performed (separate procedure) Diagnosis Code(s):     --- Professional ---                        K92.2, Gastrointestinal hemorrhage, unspecified                        K92.1, Melena (includes Hematochezia)                        K57.30, Diverticulosis of large intestine without                         perforation or abscess without bleeding CPT copyright 2019 American Medical Association. All rights reserved. The codes documented in this report are preliminary and upon coder review may  be revised to meet current compliance requirements. Andrey Farmer MD, MD 08/31/2022 11:29:35 AM Number of Addenda: 0 Note Initiated On: 08/31/2022 10:34 AM Estimated Blood Loss:  Estimated blood loss: none.      [facility]

## 2022-08-31 NOTE — Transfer of Care (Signed)
Immediate Anesthesia Transfer of Care Note  Patient: Dana [name]  Procedure(s) Performed: ESOPHAGOGASTRODUODENOSCOPY (EGD) COLONOSCOPY  Patient Location: PACU  Anesthesia Type:General  Level of Consciousness: awake  Airway & Oxygen Therapy: Patient Spontanous Breathing  Post-op Assessment: Report given to RN  Post vital signs: stable  Last Vitals:  Vitals Value Taken Time  BP 126/63 08/31/22 1128  Temp    Pulse    Resp 18 08/31/22 1132  SpO2    Vitals shown include unvalidated device data.  Last Pain:  Vitals:   08/31/22 1042  TempSrc: Temporal  PainSc:          Complications: No notable events documented.

## 2022-08-31 NOTE — Progress Notes (Signed)
PROGRESS NOTE    Dana [name]  ENI:[id number] DOB: 1946/10/17 DOA: 08/30/2022 PCP: Gladstone Lighter, MD    Assessment & Plan:   Principal Problem:   GI bleeding Active Problems:   Acute blood loss anemia (ABLA)   CKD (chronic kidney disease), stage V (HCC)   Type II diabetes mellitus with renal manifestations (HCC)   HTN (hypertension)  Assessment and Plan: GI bleed: secondary to likely diverticular bleed. D/c PPI. S/p 2 units of pRBCs transfused. H&H are trending up. S/p EGD which showed normal esophagus, stomach & erythematous duodenopathy. S/p colonoscopy which showed examined portion of ileum was normal, blood in the entire examined colon, extensive diverticulosis in colon, this was likely a diverticular bleed that has resolved. GI following and recs apprec   Acute blood loss anemia: etiology unclear, possible GI bleed. S/p 2 units of pRBCs transfused. H&H are trending up. Continue to monitor H&H  AKI on CKDV: baseline Cr 2.5-3.0. Cr is trending down slightly   DM2: poorly controlled, HbA1c 7.1. Continue on glargine, SSI w/ accuchecks    HTN: continue amlodipine. IV hydralazine prn   Fall: at home. PT consulted     DVT prophylaxis: SCDs Code Status: full  Family Communication: discussed pt's care w/ pt's family at bedside and answered their questions  Disposition Plan: will likely d/c back home  Level of care: Progressive  Status is: Inpatient Remains inpatient appropriate because: severity of illness   Consultants:  GI   Procedures:   Antimicrobials:    Subjective: Pt c/o malaise  Objective: Vitals:   08/30/22 2353 08/31/22 0042 08/31/22 0412 08/31/22 0755  BP: 129/62 (!) 120/59 132/64 (!) 167/76  Pulse: 83 74 80 71  Resp: 20 20 16 18   Temp: 98.4 F (36.9 C) 98.5 F (36.9 C) 98.2 F (36.8 C) 97.6 F (36.4 C)  TempSrc:   Oral   SpO2: 99% 97% 99% 100%  Weight:      Height:        Intake/Output Summary (Last 24 hours) at 08/31/2022  0813 Last data filed at 08/31/2022 0042 Gross per 24 hour  Intake 729.1 ml  Output --  Net 729.1 ml   Filed Weights   08/30/22 2032  Weight: 65.7 kg    Examination:  General exam: Appears calm and comfortable  Respiratory system: Clear to auscultation. Respiratory effort normal. Cardiovascular system: S1 & S2 +. No  rubs, gallops or clicks. No pedal edema. Gastrointestinal system: Abdomen is nondistended, soft and nontender. Normal bowel sounds heard. Central nervous system: Alert and oriented. Moves all extremities  Psychiatry: Judgement and insight appear normal. Mood & affect appropriate.     Data Reviewed: I have personally reviewed following labs and imaging studies  CBC: Recent Labs  Lab 08/30/22 1600 08/31/22 0453  WBC 9.3  8.7 8.4  NEUTROABS 5.3  --   HGB 4.3*  4.5* 7.3*  HCT 14.4*  15.3* 22.2*  MCV 97.3  98.7 87.7  PLT 233  228 614   Basic Metabolic Panel: Recent Labs  Lab 08/30/22 1600 08/31/22 0453  NA 137 141  K 4.9 4.5  CL 114* 117*  CO2 17* 19*  GLUCOSE 131* 88  BUN 46* 42*  CREATININE 3.67* 3.59*  CALCIUM 8.1* 8.0*   GFR: Estimated Creatinine Clearance: 11.8 mL/min (A) (by C-G formula based on SCr of 3.59 mg/dL (H)). Liver Function Tests: Recent Labs  Lab 08/30/22 1600  AST 19  ALT 11  ALKPHOS 62  BILITOT 0.4  PROT 5.9*  ALBUMIN 2.8*   No results for input(s): "LIPASE", "AMYLASE" in the last 168 hours. No results for input(s): "AMMONIA" in the last 168 hours. Coagulation Profile: Recent Labs  Lab 08/31/22 0453  INR 1.3*   Cardiac Enzymes: No results for input(s): "CKTOTAL", "CKMB", "CKMBINDEX", "TROPONINI" in the last 168 hours. BNP (last 3 results) No results for input(s): "PROBNP" in the last 8760 hours. HbA1C: No results for input(s): "HGBA1C" in the last 72 hours. CBG: Recent Labs  Lab 08/30/22 2034  GLUCAP 115*   Lipid Profile: No results for input(s): "CHOL", "HDL", "LDLCALC", "TRIG", "CHOLHDL", "LDLDIRECT"  in the last 72 hours. Thyroid Function Tests: No results for input(s): "TSH", "T4TOTAL", "FREET4", "T3FREE", "THYROIDAB" in the last 72 hours. Anemia Panel: Recent Labs    08/30/22 1601  FOLATE >40.0  FERRITIN 38  TIBC 262  IRON 86  RETICCTPCT 2.9   Sepsis Labs: No results for input(s): "PROCALCITON", "LATICACIDVEN" in the last 168 hours.  No results found for this or any previous visit (from the past 240 hour(s)).       Radiology Studies: No results found.      Scheduled Meds:  amLODipine  10 mg Oral Daily   ferrous sulfate  325 mg Oral Q breakfast   insulin aspart  0-5 Units Subcutaneous QHS   insulin aspart  0-9 Units Subcutaneous TID WC   insulin glargine-yfgn  15 Units Subcutaneous QHS   pantoprazole (PROTONIX) IV  40 mg Intravenous Q12H   sodium bicarbonate  650 mg Oral BID   Continuous Infusions:   LOS: 1 day    Time spent: 35 mins    Wyvonnia Dusky, MD [facility] Pager [phone]  If 7PM-7AM, please contact night-coverage [email or web address] 08/31/2022, 8:13 AM

## 2022-08-31 NOTE — Anesthesia Preprocedure Evaluation (Signed)
Anesthesia Evaluation  Patient identified by MRN, date of birth, ID band Patient awake    Reviewed: Allergy & Precautions, NPO status , Patient's Chart, lab work & pertinent test results  History of Anesthesia Complications Negative for: history of anesthetic complications  Airway Mallampati: IV   Neck ROM: Full    Dental  (+) Chipped   Pulmonary neg pulmonary ROS,    Pulmonary exam normal breath sounds clear to auscultation       Cardiovascular hypertension, (-) angina+ Peripheral Vascular Disease  Normal cardiovascular exam Rhythm:Regular Rate:Normal  ECG 03/30/22:  Normal sinus rhythm Moderate voltage criteria for LVH, may be normal variant ( R in aVL , Cornell product )   Neuro/Psych negative neurological ROS     GI/Hepatic negative GI ROS,   Endo/Other  diabetes, Type 2  Renal/GU Renal disease (nephrolithiasis, stage IV CKD)     Musculoskeletal   Abdominal   Peds  Hematology  (+) Blood dyscrasia, anemia ,   Anesthesia Other Findings Past Medical History: No date: Anemia No date: Arthritis No date: Chronic kidney disease No date: DM (diabetes mellitus), type 1 (HCC) No date: History of kidney stones No date: Hypertension No date: Peripheral vascular disease (HCC)   Reproductive/Obstetrics                             Anesthesia Physical  Anesthesia Plan  ASA: 3  Anesthesia Plan: General   Post-op Pain Management:    Induction: Intravenous  PONV Risk Score and Plan: 3 and Propofol infusion and TIVA  Airway Management Planned: Natural Airway and Nasal Cannula  Additional Equipment:   Intra-op Plan:   Post-operative Plan: Extubation in OR  Informed Consent: I have reviewed the patients History and Physical, chart, labs and discussed the procedure including the risks, benefits and alternatives for the proposed anesthesia with the patient or authorized representative  who has indicated his/her understanding and acceptance.     Dental advisory given  Plan Discussed with: CRNA  Anesthesia Plan Comments: (Patient consented for risks of anesthesia including but not limited to:  - adverse reactions to medications - damage to eyes, teeth, lips or other oral mucosa - nerve damage due to positioning  - sore throat or hoarseness - damage to heart, brain, nerves, lungs, other parts of body or loss of life  Informed patient about role of CRNA in peri- and intra-operative care.  Patient voiced understanding.)        Anesthesia Quick Evaluation

## 2022-08-31 NOTE — Progress Notes (Signed)
Second blood transfusion completed without any reaction, Will continue to monitor.

## 2022-08-31 NOTE — Plan of Care (Signed)

## 2022-08-31 NOTE — Consult Note (Signed)
[facility] CONSULT NOTE    Date: 08/31/2022                  Patient Name:  Dana [name]  MRN: [id number]  DOB: [birth year]  Age / Sex: 76 y.o., female         PCP: Gladstone Lighter, MD                 Service Requesting Consult: Medicine                  Reason for Consult: Renal insufficiency             History of Present Illness: Patient is a 76 y.o. female with a PMHx of hypertension, hyperlipidemia, diabetes, peripheral vascular disease, nephrolithiasis, anemia, CKD stage V now admitted with history of hemoglobin of 4.5.  She had 3 units of blood transfusion.  Medications: Outpatient medications: Medications Prior to Admission  Medication Sig Dispense Refill Last Dose   acetaminophen (TYLENOL) 500 MG tablet Take 500 mg by mouth every 6 (six) hours as needed for mild pain.   Past Week   amLODipine (NORVASC) 10 MG tablet TAKE 1 TABLET BY MOUTH EVERY DAY 90 tablet 0 08/30/2022   aspirin 81 MG EC tablet Take 81 mg by mouth See admin instructions. Takes on Tues and Thur only due to history of eye bleed.   Past Week   B Complex-C (B-COMPLEX WITH VITAMIN C) tablet Take 1 tablet by mouth daily.   Past Week   Ferrous Sulfate 142 (45 Fe) MG TBCR Take 1 tablet by mouth every morning.   08/29/2022   insulin detemir (LEVEMIR FLEXTOUCH) 100 UNIT/ML FlexPen Inject 10 Units into the skin daily. (Patient taking differently: Inject 30 Units into the skin daily.) 15 mL 0 08/29/2022   JARDIANCE 10 MG TABS tablet Take 10 mg by mouth daily.   08/30/2022   Omega-3 Fatty Acids (FISH OIL PO) Take 1 capsule by mouth daily.   Past Week   sodium bicarbonate 650 MG tablet Take 1 tablet (650 mg total) by mouth 2 (two) times daily. 60 tablet 0 Past Week   Turmeric 500 MG TABS Take 500 mg by mouth daily at 6 (six) AM.   Past Week   vitamin C (ASCORBIC ACID) 500 MG tablet Take 500 mg by mouth 2 (two) times daily.   Past Week   VITAMIN D PO Take 2 tablets by mouth daily.   Past Week    zinc gluconate 50 MG tablet Take 1 tablet by mouth daily.   Past Week   hydrochlorothiazide (HYDRODIURIL) 25 MG tablet Take by mouth. (Patient not taking: Reported on 08/30/2022)   Not Taking   telmisartan-hydrochlorothiazide (MICARDIS HCT) 40-12.5 MG tablet Take 1 tablet by mouth daily. (Patient not taking: Reported on 08/30/2022)   Not Taking    Discontinued Meds:   Medications Discontinued During This Encounter  Medication Reason   0.9 %  sodium chloride infusion Patient Transfer   pantoprazole (PROTONIX) injection 40 mg     Current medications: Current Facility-Administered Medications  Medication Dose Route Frequency Provider Last Rate Last Admin   acetaminophen (TYLENOL) tablet 650 mg  650 mg Oral Q6H PRN Ivor Costa, MD       amLODipine (NORVASC) tablet 10 mg  10 mg Oral Daily Ivor Costa, MD   10 mg at 08/31/22 1343   ferrous sulfate tablet 325 mg  325 mg Oral Q breakfast Ivor Costa, MD   325 mg  at 08/31/22 1343   hydrALAZINE (APRESOLINE) injection 5 mg  5 mg Intravenous Q2H PRN Ivor Costa, MD       insulin aspart (novoLOG) injection 0-5 Units  0-5 Units Subcutaneous QHS Ivor Costa, MD       insulin aspart (novoLOG) injection 0-9 Units  0-9 Units Subcutaneous TID WC Ivor Costa, MD       insulin glargine-yfgn [facility]) injection 15 Units  15 Units Subcutaneous QHS Ivor Costa, MD   15 Units at 08/30/22 2154   ondansetron (ZOFRAN) injection 4 mg  4 mg Intravenous Q8H PRN Ivor Costa, MD       sodium bicarbonate tablet 650 mg  650 mg Oral BID Ivor Costa, MD   650 mg at 08/31/22 1343      Allergies: Allergies  Allergen Reactions   Clindamycin Dermatitis and Rash   Clindamycin/Lincomycin Rash   Chocolate     Nosebleed   Chocolate Flavor      Nosebleed   Food Color Red     Other reaction(s): Unknown Other reaction(s): Unknown   Food Color Yellow Other (See Comments)    Other reaction(s): Unknown Other reaction(s): Unknown Other reaction(s): Unknown Other reaction(s):  Unknown Other reaction(s): Unknown      Past Medical History: Past Medical History:  Diagnosis Date   Anemia    Arthritis    Chronic kidney disease    DM (diabetes mellitus), type 1 ([place])    History of kidney stones    Hypertension    Peripheral vascular disease ([place])      Past Surgical History: Past Surgical History:  Procedure Laterality Date   ABDOMINAL HYSTERECTOMY     COLONOSCOPY     CYSTOSCOPY W/ RETROGRADES Right 04/08/2022   Procedure: CYSTOSCOPY WITH RETROGRADE PYELOGRAM;  Surgeon: Abbie Sons, MD;  Location: [facility];  Service: Urology;  Laterality: Right;   CYSTOSCOPY WITH STENT PLACEMENT Right 03/08/2022   Procedure: CYSTOSCOPY WITH RIGHT URETERALSTENT PLACEMENT;  Surgeon: Abbie Sons, MD;  Location: [facility];  Service: Urology;  Laterality: Right;   CYSTOSCOPY/URETEROSCOPY/HOLMIUM LASER/STENT PLACEMENT Right 04/08/2022   Procedure: CYSTOSCOPY/URETEROSCOPY/HOLMIUM LASER/STENT PLACEMENT;  Surgeon: Abbie Sons, MD;  Location: [facility];  Service: Urology;  Laterality: Right;   EYE SURGERY Left 11/28/2021   HYSTERECTOMY ABDOMINAL WITH SALPINGO-OOPHORECTOMY     URETEROSCOPY Right 03/08/2022   Procedure: URETEROSCOPY;  Surgeon: Abbie Sons, MD;  Location: [facility];  Service: Urology;  Laterality: Right;     Family History: Family History  Problem Relation Age of Onset   Diabetes Mother    Hypertension Father    Diabetes Sister    Diabetes Sister    Leukemia Sister    Hypertension Sister    Diabetes Brother    Colon cancer Brother    Diabetes Brother    Diabetes Brother      Social History: Social History   Socioeconomic History   Marital status: Married    Spouse name: Toula Moos   Number of children: Not on file   Years of education: Not on file   Highest education level: Not on file  Occupational History   Not on file  Tobacco Use   Smoking status: Never   Smokeless tobacco: Never  Vaping Use   Vaping Use: Never used  Substance  and Sexual Activity   Alcohol use: Never   Drug use: Never   Sexual activity: Yes  Other Topics Concern   Not on file  Social History Narrative   From [place]; in  60s to [place]. 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    Social Determinants of Health   Financial Resource Strain: Not on file  Food Insecurity: Not on file  Transportation Needs: Not on file  Physical Activity: Not on file  Stress: Not on file  Social Connections: Not on file  Intimate Partner Violence: Not on file     Review of Systems: As per HPI  Vital Signs: Blood pressure (!) 165/72, pulse 73, temperature (!) 96.3 F (35.7 C), temperature source Temporal, resp. rate 17, height 5\' 2"  (1.575 m), weight 65.7 kg, SpO2 99 %.  Weight trends: Filed Weights   08/30/22 2032  Weight: 65.7 kg    Physical Exam: Physical Exam: General:  No acute distress  Head:  Normocephalic, atraumatic. Moist oral mucosal membranes  Eyes:  Anicteric  Neck:  Supple  Lungs:   Clear to auscultation, normal effort  Heart:  S1S2 no rubs  Abdomen:   Soft, nontender, bowel sounds present  Extremities:  peripheral edema.  Neurologic:  Awake, alert, following commands  Skin:  No lesions  Access:     Lab results:  Basic Metabolic Panel: Recent Labs  Lab 08/30/22 1600 08/31/22 0453  NA 137 141  K 4.9 4.5  CL 114* 117*  CO2 17* 19*  GLUCOSE 131* 88  BUN 46* 42*  CREATININE 3.67* 3.59*  CALCIUM 8.1* 8.0*    Creatinine, Ser  Date/Time Value Ref Range Status  08/31/2022 04:53 AM 3.59 (H) 0.44 - 1.00 mg/dL Final  08/30/2022 04:00 PM 3.67 (H) 0.44 - 1.00 mg/dL Final  05/23/2022 12:23 PM 3.07 (H) 0.44 - 1.00 mg/dL Final  03/30/2022 03:40 PM 2.66 (H) 0.44 - 1.00 mg/dL Final  03/15/2022 04:54 AM 2.49 (H) 0.44 - 1.00 mg/dL Final  03/14/2022 03:30 AM 2.73 (H) 0.44 - 1.00 mg/dL Final  03/13/2022 03:47 AM 2.87 (H) 0.44 - 1.00 mg/dL Final  03/12/2022 04:07 AM 3.36 (H) 0.44 - 1.00 mg/dL Final  03/11/2022 04:17 AM  3.72 (H) 0.44 - 1.00 mg/dL Final  03/10/2022 04:02 AM 4.66 (H) 0.44 - 1.00 mg/dL Final  03/09/2022 04:09 AM 5.34 (H) 0.44 - 1.00 mg/dL Final  03/08/2022 11:39 AM 5.81 (H) 0.44 - 1.00 mg/dL Final  03/04/2022 09:28 AM 2.50 (H) 0.44 - 1.00 mg/dL Final  09/30/2021 09:57 AM 1.83 (H) 0.44 - 1.00 mg/dL Final  03/27/2021 11:29 AM 1.81 (H) 0.44 - 1.00 mg/dL Final  04/11/2020 02:26 PM 1.43 (H) 0.40 - 1.20 mg/dL Final  03/28/2020 04:31 PM 1.67 (H) 0.44 - 1.00 mg/dL Final  12/18/2019 04:45 PM 1.22 (H) 0.44 - 1.00 mg/dL Final  12/09/2019 04:28 PM 1.04 (H) 0.44 - 1.00 mg/dL Final    CBC: Recent Labs  Lab 08/30/22 1600 08/31/22 0453 08/31/22 1221  WBC 9.3  8.7 8.4 7.5  NEUTROABS 5.3  --   --   HGB 4.3*  4.5* 7.3* 7.4*  HCT 14.4*  15.3* 22.2* 22.5*  MCV 97.3  98.7 87.7 87.5  PLT 233  228 198 205    Microbiology: Results for orders placed or performed during the hospital encounter of 03/08/22  Surgical pcr screen     Status: None   Collection Time: 03/08/22  8:11 PM   Specimen: Nasal Mucosa; Nasal Swab  Result Value Ref Range Status   MRSA, PCR NEGATIVE NEGATIVE Final   Staphylococcus aureus NEGATIVE NEGATIVE Final    Comment: (NOTE) The Xpert SA Assay (FDA approved for NASAL specimens in patients 82 years of age and  older), is one component of a comprehensive surveillance program. It is not intended to diagnose infection nor to guide or monitor treatment. Performed at [facility], [address], [place], [place] [address]   Urine Culture     Status: None   Collection Time: 03/08/22 10:30 PM   Specimen: PATH Other; Urine  Result Value Ref Range Status   Specimen Description   Final    CYSTOSCOPY Performed at [facility], [place] [address], [place], [place] [address]    Special Requests   Final    NONE Performed at [facility], [address] [place], [place] [address]    Culture   Final    NO GROWTH Performed at [facility], [place]  [address] [place], [place] [address]    Report Status 03/10/2022 FINAL  Final    Urinalysis: No results for input(s): "COLORURINE", "LABSPEC", "PHURINE", "GLUCOSEU", "HGBUR", "BILIRUBINUR", "KETONESUR", "PROTEINUR", "UROBILINOGEN", "NITRITE", "LEUKOCYTESUR" in the last 72 hours.  Invalid input(s): "APPERANCEUR"   Imaging:  No results found.   Assessment & Plan:  76 y.o. female with a PMHx of hypertension, hyperlipidemia, diabetes, peripheral vascular disease, nephrolithiasis, anemia, CKD stage V now admitted with history of hemoglobin of 4.5.  She had 3 units of blood transfusion.  Principal Problem:   GI bleeding Active Problems:   Acute blood loss anemia (ABLA)   Type II diabetes mellitus with renal manifestations (HCC)   CKD (chronic kidney disease), stage V (HCC)   HTN (hypertension)  #1: Acute kidney injury on CKD: Patient is stage V CKD with a baseline GFR of 15 cc/min.  This has reduced to 12 cc/min yesterday.  After the transfusions her GFR has improved slightly to 13 cc/min.  #2: Anemia: Status post blood transfusion.  GI work-up is in progress.  Continue oral iron supplementation.  #3: Hypertension: Blood pressure is better controlled.  We will continue the present antihypertensive medications.  #4: Metabolic acidosis: Possibly secondary to CKD.  We will continue the sodium bicarbonate as ordered.  #5: Diabetes: Continue insulin as ordered.  Dose medications for GFR of less than 15 cc/min.  Will follow closely.   LOS: [place], MD [facility]. 9/10/20234:04 PM

## 2022-08-31 NOTE — Care Plan (Signed)
EGD/Colon just showed likely resolved diverticular bleeding.  - will advance to soft diet - can d/c protonix - monitor CBC and transfuse if needed  Raylene Miyamoto MD, MPH [place]

## 2022-09-01 ENCOUNTER — Encounter: Payer: Self-pay | Admitting: Gastroenterology

## 2022-09-01 DIAGNOSIS — N179 Acute kidney failure, unspecified: Secondary | ICD-10-CM | POA: Diagnosis not present

## 2022-09-01 DIAGNOSIS — N184 Chronic kidney disease, stage 4 (severe): Secondary | ICD-10-CM | POA: Diagnosis not present

## 2022-09-01 DIAGNOSIS — D631 Anemia in chronic kidney disease: Secondary | ICD-10-CM | POA: Diagnosis not present

## 2022-09-01 DIAGNOSIS — K922 Gastrointestinal hemorrhage, unspecified: Secondary | ICD-10-CM | POA: Diagnosis not present

## 2022-09-01 DIAGNOSIS — E1122 Type 2 diabetes mellitus with diabetic chronic kidney disease: Secondary | ICD-10-CM | POA: Diagnosis not present

## 2022-09-01 DIAGNOSIS — N185 Chronic kidney disease, stage 5: Secondary | ICD-10-CM | POA: Diagnosis not present

## 2022-09-01 DIAGNOSIS — I10 Essential (primary) hypertension: Secondary | ICD-10-CM | POA: Diagnosis not present

## 2022-09-01 DIAGNOSIS — D62 Acute posthemorrhagic anemia: Secondary | ICD-10-CM | POA: Diagnosis not present

## 2022-09-01 LAB — BASIC METABOLIC PANEL
Anion gap: 5 (ref 5–15)
BUN: 39 mg/dL — ABNORMAL HIGH (ref 8–23)
CO2: 18 mmol/L — ABNORMAL LOW (ref 22–32)
Calcium: 8.2 mg/dL — ABNORMAL LOW (ref 8.9–10.3)
Chloride: 117 mmol/L — ABNORMAL HIGH (ref 98–111)
Creatinine, Ser: 3.59 mg/dL — ABNORMAL HIGH (ref 0.44–1.00)
GFR, Estimated: 13 mL/min — ABNORMAL LOW (ref >60)
Glucose, Bld: 88 mg/dL (ref 70–99)
Potassium: 4.3 mmol/L (ref 3.5–5.1)
Sodium: 140 mmol/L (ref 135–145)

## 2022-09-01 LAB — CBC
HCT: 19.9 % — ABNORMAL LOW (ref 36.0–46.0)
Hemoglobin: 6.6 g/dL — ABNORMAL LOW (ref 12.0–15.0)
MCH: 29.1 pg (ref 26.0–34.0)
MCHC: 33.2 g/dL (ref 30.0–36.0)
MCV: 87.7 fL (ref 80.0–100.0)
Platelets: 196 10*3/uL (ref 150–400)
RBC: 2.27 MIL/uL — ABNORMAL LOW (ref 3.87–5.11)
RDW: 16.1 % — ABNORMAL HIGH (ref 11.5–15.5)
WBC: 7.5 10*3/uL (ref 4.0–10.5)
nRBC: 0 % (ref 0.0–0.2)

## 2022-09-01 LAB — GLUCOSE, CAPILLARY
Glucose-Capillary: 67 mg/dL — ABNORMAL LOW (ref 70–99)
Glucose-Capillary: 81 mg/dL (ref 70–99)
Glucose-Capillary: 100 mg/dL — ABNORMAL HIGH (ref 70–99)
Glucose-Capillary: 151 mg/dL — ABNORMAL HIGH (ref 70–99)
Glucose-Capillary: 36 mg/dL — CL (ref 70–99)
Glucose-Capillary: 38 mg/dL — CL (ref 70–99)
Glucose-Capillary: 50 mg/dL — ABNORMAL LOW (ref 70–99)
Glucose-Capillary: 67 mg/dL — ABNORMAL LOW (ref 70–99)
Glucose-Capillary: 89 mg/dL (ref 70–99)

## 2022-09-01 LAB — HEMOGLOBIN AND HEMATOCRIT, BLOOD
HCT: 22.3 % — ABNORMAL LOW (ref 36.0–46.0)
Hemoglobin: 7.5 g/dL — ABNORMAL LOW (ref 12.0–15.0)

## 2022-09-01 LAB — PREPARE RBC (CROSSMATCH)

## 2022-09-01 MED ORDER — SODIUM CHLORIDE 0.9% IV SOLUTION: Freq: Once | INTRAVENOUS | Status: DC

## 2022-09-01 NOTE — Significant Event (Signed)
Hypoglycemic Event  CBG: 38 and recheck to confirm 36  Treatment: 8 oz juice/soda  Symptoms: None  Follow-up CBG: Time:2129 CBG Result:50  Possible Reasons for Event: Medication regimen: patient doesn't use novolog at home  Comments/MD notified:Patient had juice, tuna salad cracker and a fruit cup. Upon recheck she didn't want anything else to help bring her sugar up. 2200 recheck was 67 and 2235 recheck was 89. Patient did not have any symptoms from hypoglycemia.    Dana [name]

## 2022-09-01 NOTE — Progress Notes (Signed)
Patient had stated at beginning of my shift that she did not want insulin tonight because her sugar would drop as it has done multiple times in the past. She stated that she tells her nurses but they don't listen. Patient received NO insulin from me tonight but her sugar dropped.   Discussed with patient that she has the right to refuse medication if she does not want it. She knows her body and how she will react to insulin.

## 2022-09-01 NOTE — Progress Notes (Signed)
[facility]  ROUNDING NOTE   Subjective:   Patient seen sitting up in bed Alert and oriented Room air, denies shortness of breath Tolerating meals without nausea and vomiting Denies melena  Objective:  Vital signs in last 24 hours:  Temp:  [97.8 F (36.6 C)-98.6 F (37 C)] (P) 98.6 F (37 C) (09/11 1256) Pulse Rate:  [74-84] (P) 76 (09/11 1256) Resp:  [14-20] (P) 18 (09/11 1256) BP: (119-156)/(60-93) (P) 152/70 (09/11 1256) SpO2:  [97 %-100 %] (P) 100 % (09/11 1256) Weight:  [65.3 kg] 65.3 kg (09/11 0500)  Weight change: -0.381 kg Filed Weights   08/30/22 2032 09/01/22 0500  Weight: 65.7 kg 65.3 kg    Intake/Output: I/O last 3 completed shifts: In: 1285.3 [P.O.:120; I.V.:200; Blood:725.3; Other:240] Out: -    Intake/Output this shift:  Total I/O In: 440 [P.O.:120; Blood:320] Out: -   Physical Exam: General: NAD  Head: Normocephalic, atraumatic. Moist oral mucosal membranes  Eyes: Anicteric  Lungs:  Clear to auscultation, normal effort  Heart: Regular rate and rhythm  Abdomen:  Soft, nontender  Extremities:  No peripheral edema.  Neurologic: Nonfocal, moving all four extremities  Skin: No lesions  Access: None    Basic Metabolic Panel: Recent Labs  Lab 08/30/22 1600 08/31/22 0453 09/01/22 0447  NA 137 141 140  K 4.9 4.5 4.3  CL 114* 117* 117*  CO2 17* 19* 18*  GLUCOSE 131* 88 88  BUN 46* 42* 39*  CREATININE 3.67* 3.59* 3.59*  CALCIUM 8.1* 8.0* 8.2*    Liver Function Tests: Recent Labs  Lab 08/30/22 1600  AST 19  ALT 11  ALKPHOS 62  BILITOT 0.4  PROT 5.9*  ALBUMIN 2.8*   No results for input(s): "LIPASE", "AMYLASE" in the last 168 hours. No results for input(s): "AMMONIA" in the last 168 hours.  CBC: Recent Labs  Lab 08/30/22 1600 08/31/22 0453 08/31/22 1221 08/31/22 1730 09/01/22 0447  WBC 9.3  8.7 8.4 7.5 8.4 7.5  NEUTROABS 5.3  --   --   --   --   HGB 4.3*  4.5* 7.3* 7.4* 7.1* 6.6*  HCT 14.4*  15.3* 22.2*  22.5* 21.4* 19.9*  MCV 97.3  98.7 87.7 87.5 88.1 87.7  PLT 233  228 198 205 153 196    Cardiac Enzymes: No results for input(s): "CKTOTAL", "CKMB", "CKMBINDEX", "TROPONINI" in the last 168 hours.  BNP: Invalid input(s): "POCBNP"  CBG: Recent Labs  Lab 08/31/22 0924 08/31/22 1711 08/31/22 2053 09/01/22 0817 09/01/22 1148  GLUCAP 82 100* 90 60 100*    Microbiology: Results for orders placed or performed during the hospital encounter of 03/08/22  Surgical pcr screen     Status: None   Collection Time: 03/08/22  8:11 PM   Specimen: Nasal Mucosa; Nasal Swab  Result Value Ref Range Status   MRSA, PCR NEGATIVE NEGATIVE Final   Staphylococcus aureus NEGATIVE NEGATIVE Final    Comment: (NOTE) The Xpert SA Assay (FDA approved for NASAL specimens in patients 108 years of age and older), is one component of a comprehensive surveillance program. It is not intended to diagnose infection nor to guide or monitor treatment. Performed at [facility], [address], [place], [place] [address]   Urine Culture     Status: None   Collection Time: 03/08/22 10:30 PM   Specimen: PATH Other; Urine  Result Value Ref Range Status   Specimen Description   Final    CYSTOSCOPY Performed at [facility],  [address], [place], [place] [address]    Special Requests   Final    NONE Performed at [facility], [address] [place], [place] [address]    Culture   Final    NO GROWTH Performed at [facility], [place] [address], [place], [place] [address]    Report Status 03/10/2022 FINAL  Final    Coagulation Studies: Recent Labs    08/31/22 0453  LABPROT 16.3*  INR 1.3*    Urinalysis: No results for input(s): "COLORURINE", "LABSPEC", "PHURINE", "GLUCOSEU", "HGBUR", "BILIRUBINUR", "KETONESUR", "PROTEINUR", "UROBILINOGEN", "NITRITE", "LEUKOCYTESUR" in the last 72 hours.  Invalid input(s): "APPERANCEUR"    Imaging: No results  found.   Medications:     sodium chloride   Intravenous Once   amLODipine  10 mg Oral Daily   ferrous sulfate  325 mg Oral Q breakfast   insulin aspart  0-5 Units Subcutaneous QHS   insulin aspart  0-9 Units Subcutaneous TID WC   insulin glargine-yfgn  15 Units Subcutaneous QHS   sodium bicarbonate  650 mg Oral BID   acetaminophen, hydrALAZINE, ondansetron (ZOFRAN) IV  Assessment/ Plan:  [name] is a 76 y.o.  female with a PMHx of hypertension, hyperlipidemia, diabetes, peripheral vascular disease, nephrolithiasis, anemia, CKD stage V now admitted with history of hemoglobin of 4.5.  She had 3 units of blood transfusion.  Acute kidney injury on CKD stage IV:  Acute kidney injury likely due to hypovolemia. Renal function slowly improving after blood transfusion. No acute need for dialysis at this time, monitoring closely as renal function was poor prior.   Lab Results  Component Value Date   CREATININE 3.59 (H) 09/01/2022   CREATININE 3.59 (H) 08/31/2022   CREATININE 3.67 (H) 08/30/2022    Intake/Output Summary (Last 24 hours) at 09/01/2022 1456 Last data filed at 09/01/2022 1256 Gross per 24 hour  Intake 680 ml  Output --  Net 680 ml   2. Anemia of chronic kidney disease Lab Results  Component Value Date   HGB 6.6 (L) 09/01/2022    Patient has received 3 units of blood during this admission and is scheduled to receive one unit today. EGD negative, colonoscopy showed diverticulitis. Patient admits to eating walnuts every other day. States she will have to grind them up now.   3. Hypertension with chronic kidney disease. Home regimen includes amlodipine and HCTZ. Currently receiving Amlodipine. Blood pressure 152/70  4. Diabetes mellitus type II with chronic kidney disease/renal manifestations: noninsulin dependent. Most recent hemoglobin A1c is 7.1 on 03/11/22.  Jardiance held   LOS: [place] 9/11/20232:56 PM

## 2022-09-01 NOTE — Progress Notes (Signed)
Dr. Sidney Ace responded saying, "It is very close to yesterday's level so I will defer to Dr. Jimmye Norman when she comes at 7 please let the a.m. nurse notified her at 7 or shortly after."

## 2022-09-01 NOTE — Progress Notes (Signed)
GI Inpatient Follow-up Note  Subjective:  Patient seen and maybe a small amount of residual blood but not a lot. Required pRBC  Scheduled Inpatient Medications:   sodium chloride   Intravenous Once   amLODipine  10 mg Oral Daily   ferrous sulfate  325 mg Oral Q breakfast   insulin aspart  0-5 Units Subcutaneous QHS   insulin aspart  0-9 Units Subcutaneous TID WC   insulin glargine-yfgn  15 Units Subcutaneous QHS   sodium bicarbonate  650 mg Oral BID    Continuous Inpatient Infusions:    PRN Inpatient Medications:  acetaminophen, hydrALAZINE, ondansetron (ZOFRAN) IV  Review of Systems:  Review of Systems  Constitutional:  Negative for chills and fever.  Respiratory:  Negative for shortness of breath.   Cardiovascular:  Negative for chest pain.  Gastrointestinal:  Negative for abdominal pain, constipation, diarrhea, melena and vomiting.  Musculoskeletal:  Negative for joint pain.  Skin:  Negative for rash.  Neurological:  Negative for focal weakness.  Psychiatric/Behavioral:  Negative for substance abuse.   All other systems reviewed and are negative.     Physical Examination: BP (!) 144/65 (BP Location: Left Arm)   Pulse 82   Temp 98 F (36.7 C)   Resp 17   Ht 5\' 2"  (1.575 m)   Wt 65.3 kg   SpO2 100%   BMI 26.33 kg/m  Gen: NAD, alert and oriented x 4 HEENT: PEERLA, EOMI, Neck: supple, no JVD or thyromegaly Chest: No respiratory distress Abd: soft, non-tender, non-distended Ext: no edema, well perfused with 2+ pulses, Skin: no rash or lesions noted Lymph: no LAD  Data: Lab Results  Component Value Date   WBC 7.5 09/01/2022   HGB 6.6 (L) 09/01/2022   HCT 19.9 (L) 09/01/2022   MCV 87.7 09/01/2022   PLT 196 09/01/2022   Recent Labs  Lab 08/31/22 1221 08/31/22 1730 09/01/22 0447  HGB 7.4* 7.1* 6.6*   Lab Results  Component Value Date   NA 140 09/01/2022   K 4.3 09/01/2022   CL 117 (H) 09/01/2022   CO2 18 (L) 09/01/2022   BUN 39 (H) 09/01/2022    CREATININE 3.59 (H) 09/01/2022   Lab Results  Component Value Date   ALT 11 08/30/2022   AST 19 08/30/2022   ALKPHOS 62 08/30/2022   BILITOT 0.4 08/30/2022   Recent Labs  Lab 08/31/22 0453  APTT 36  INR 1.3*   Assessment/Plan: Ms. Hinchey is a 76 y.o. lady with CKD 4-5 here with hematochezia likely secondary to diverticular bleeding. Seems to have resolved.  Recommendations:  - transfuse to keep hemoglobin > 7 - maintain active type and screen - trend daily CBC's - appreciate nephrology recommendations - baseline hemoglobin recently has been around 8 - monitor for any recurrent overt GI bleeding  Please call with any questions or concerns, we will likely follow peripherally.  Raylene Miyamoto MD, MPH [place]

## 2022-09-01 NOTE — Progress Notes (Signed)
PT Cancellation Note  Patient Details Name: Dana [name] MRN: [id number] DOB: [birth year]   Cancelled Treatment:    Reason Eval/Treat Not Completed: Medical issues which prohibited therapy.  Pt's Hgb 6.6 and trending down falling outside guidelines for participation with PT services.  Will attempt to see pt at a future date/time as medically appropriate.    Linus Salmons PT, DPT 09/01/22, 8:48 AM

## 2022-09-01 NOTE — Progress Notes (Signed)
Mobility Specialist - Progress Note   09/01/22 1502  Mobility  Activity Ambulated independently in hallway  Level of Assistance Independent  Assistive Device None (railing in hallway)  Distance Ambulated (ft) 120 ft  Activity Response Tolerated well  $Mobility charge 1 Mobility   Pt standing in doorway upon entry, utilizing RA. Pt ambulated 120 ft in the hallway using the railing in the hallway during ambulation, tolerated well. Pt left sitting EOB with needs within reach. No complaints.   Candie Mile Mobility Specialist 09/01/22 3:06 PM

## 2022-09-01 NOTE — Progress Notes (Signed)
Patient's Hgb is 6.6 this morning. Lab didn't call to report this. I happened to check my her labs and discovered it. Dr. Sidney Ace paged.

## 2022-09-01 NOTE — Progress Notes (Signed)
PROGRESS NOTE    Dana [name]  CBJ:[id number] DOB: [birth year] DOA: 08/30/2022 PCP: Gladstone Lighter, MD    Assessment & Plan:   Principal Problem:   GI bleeding Active Problems:   Acute blood loss anemia (ABLA)   CKD (chronic kidney disease), stage V (HCC)   Type II diabetes mellitus with renal manifestations (HCC)   HTN (hypertension)  Assessment and Plan: GI bleed: secondary to likely diverticular bleed. D/c PPI. S/p 2 units of pRBCs transfused. H&H are trending down today and pt needs another transfusion. S/p EGD which showed normal esophagus, stomach & erythematous duodenopathy. S/p colonoscopy which showed examined portion of ileum was normal, blood in the entire examined colon, extensive diverticulosis in colon, this was likely a diverticular bleed that has resolved.  Blood & clots in stool this morning sometime. If another episode of bleeding occurs will order a tagged RBC scan.   Acute blood loss anemia: etiology unclear, possible GI bleed. S/p 2 units of pRBCs transfused. Hb is 6.6 so will transfuse 1 unit of pRBCs today. Repeat H&H ordered   AKI on CKDV: baseline Cr 2.5-3.0. Cr is stable from day prior   DM2: HbA1c 7.1, poorly controlled. Continue on glargine, SSI w/ accuchecks    HTN: continue on CCB. IV hydralazine prn   Fall: at home. PT consulted      DVT prophylaxis: SCDs Code Status: full  Family Communication: Disposition Plan: will likely d/c back home  Level of care: Progressive  Status is: Inpatient Remains inpatient appropriate because: severity of illness, requiring a pRBCs transfusion today    Consultants:  GI   Procedures:   Antimicrobials:    Subjective: Pt c/o 1 episode of blood & clots in stool   Objective: Vitals:   09/01/22 0036 09/01/22 0458 09/01/22 0500 09/01/22 0816  BP:  (!) 119/93  (!) 153/62  Pulse:  80  84  Resp:  16  20  Temp: 98.1 F (36.7 C)  98.5 F (36.9 C) 98.3 F (36.8 C)  TempSrc: Oral  Oral   SpO2:   100%  97%  Weight:   65.3 kg   Height:        Intake/Output Summary (Last 24 hours) at 09/01/2022 0816 Last data filed at 09/01/2022 0500 Gross per 24 hour  Intake 560 ml  Output --  Net 560 ml   Filed Weights   08/30/22 2032 09/01/22 0500  Weight: 65.7 kg 65.3 kg    Examination:  General exam: Appears comfortable  Respiratory system: clear breath sounds b/l  Cardiovascular system: S1/S2+. No rubs or clicks  Gastrointestinal system: Abd is soft, NT, ND & hyperactive bowel sounds  Central nervous system: alert and oriented. Moves all extremities  Psychiatry: judgement and insight appears normal. Appropriate mood and affect     Data Reviewed: I have personally reviewed following labs and imaging studies  CBC: Recent Labs  Lab 08/30/22 1600 08/31/22 0453 08/31/22 1221 08/31/22 1730 09/01/22 0447  WBC 9.3  8.7 8.4 7.5 8.4 7.5  NEUTROABS 5.3  --   --   --   --   HGB 4.3*  4.5* 7.3* 7.4* 7.1* 6.6*  HCT 14.4*  15.3* 22.2* 22.5* 21.4* 19.9*  MCV 97.3  98.7 87.7 87.5 88.1 87.7  PLT 233  228 198 205 153 160   Basic Metabolic Panel: Recent Labs  Lab 08/30/22 1600 08/31/22 0453 09/01/22 0447  NA 137 141 140  K 4.9 4.5 4.3  CL 114* 117* 117*  CO2 17* 19*  18*  GLUCOSE 131* 88 88  BUN 46* 42* 39*  CREATININE 3.67* 3.59* 3.59*  CALCIUM 8.1* 8.0* 8.2*   GFR: Estimated Creatinine Clearance: 11.8 mL/min (A) (by C-G formula based on SCr of 3.59 mg/dL (H)). Liver Function Tests: Recent Labs  Lab 08/30/22 1600  AST 19  ALT 11  ALKPHOS 62  BILITOT 0.4  PROT 5.9*  ALBUMIN 2.8*   No results for input(s): "LIPASE", "AMYLASE" in the last 168 hours. No results for input(s): "AMMONIA" in the last 168 hours. Coagulation Profile: Recent Labs  Lab 08/31/22 0453  INR 1.3*   Cardiac Enzymes: No results for input(s): "CKTOTAL", "CKMB", "CKMBINDEX", "TROPONINI" in the last 168 hours. BNP (last 3 results) No results for input(s): "PROBNP" in the last 8760  hours. HbA1C: No results for input(s): "HGBA1C" in the last 72 hours. CBG: Recent Labs  Lab 08/30/22 2034 08/31/22 0924 08/31/22 1711 08/31/22 2053  GLUCAP 115* 82 100* 90   Lipid Profile: No results for input(s): "CHOL", "HDL", "LDLCALC", "TRIG", "CHOLHDL", "LDLDIRECT" in the last 72 hours. Thyroid Function Tests: No results for input(s): "TSH", "T4TOTAL", "FREET4", "T3FREE", "THYROIDAB" in the last 72 hours. Anemia Panel: Recent Labs    08/30/22 1601 08/31/22 0453  VITAMINB12  --  887  FOLATE >40.0  --   FERRITIN 38  --   TIBC 262  --   IRON 86  --   RETICCTPCT 2.9  --    Sepsis Labs: No results for input(s): "PROCALCITON", "LATICACIDVEN" in the last 168 hours.  No results found for this or any previous visit (from the past 240 hour(s)).       Radiology Studies: No results found.      Scheduled Meds:  sodium chloride   Intravenous Once   amLODipine  10 mg Oral Daily   ferrous sulfate  325 mg Oral Q breakfast   insulin aspart  0-5 Units Subcutaneous QHS   insulin aspart  0-9 Units Subcutaneous TID WC   insulin glargine-yfgn  15 Units Subcutaneous QHS   sodium bicarbonate  650 mg Oral BID   Continuous Infusions:   LOS: 2 days    Time spent: 37 mins    Wyvonnia Dusky, MD [facility] Pager [phone]  If 7PM-7AM, please contact night-coverage [email or web address] 09/01/2022, 8:16 AM

## 2022-09-02 DIAGNOSIS — D631 Anemia in chronic kidney disease: Secondary | ICD-10-CM | POA: Diagnosis not present

## 2022-09-02 DIAGNOSIS — N185 Chronic kidney disease, stage 5: Secondary | ICD-10-CM | POA: Diagnosis not present

## 2022-09-02 DIAGNOSIS — D62 Acute posthemorrhagic anemia: Secondary | ICD-10-CM | POA: Diagnosis not present

## 2022-09-02 DIAGNOSIS — N179 Acute kidney failure, unspecified: Secondary | ICD-10-CM | POA: Diagnosis not present

## 2022-09-02 DIAGNOSIS — N184 Chronic kidney disease, stage 4 (severe): Secondary | ICD-10-CM | POA: Diagnosis not present

## 2022-09-02 DIAGNOSIS — K922 Gastrointestinal hemorrhage, unspecified: Secondary | ICD-10-CM | POA: Diagnosis not present

## 2022-09-02 DIAGNOSIS — I129 Hypertensive chronic kidney disease with stage 1 through stage 4 chronic kidney disease, or unspecified chronic kidney disease: Secondary | ICD-10-CM | POA: Diagnosis not present

## 2022-09-02 LAB — BASIC METABOLIC PANEL
Anion gap: 4 — ABNORMAL LOW (ref 5–15)
BUN: 51 mg/dL — ABNORMAL HIGH (ref 8–23)
CO2: 21 mmol/L — ABNORMAL LOW (ref 22–32)
Calcium: 8.2 mg/dL — ABNORMAL LOW (ref 8.9–10.3)
Chloride: 115 mmol/L — ABNORMAL HIGH (ref 98–111)
Creatinine, Ser: 3.62 mg/dL — ABNORMAL HIGH (ref 0.44–1.00)
GFR, Estimated: 12 mL/min — ABNORMAL LOW (ref >60)
Glucose, Bld: 98 mg/dL (ref 70–99)
Potassium: 4.7 mmol/L (ref 3.5–5.1)
Sodium: 140 mmol/L (ref 135–145)

## 2022-09-02 LAB — GLUCOSE, CAPILLARY
Glucose-Capillary: 86 mg/dL (ref 70–99)
Glucose-Capillary: 156 mg/dL — ABNORMAL HIGH (ref 70–99)
Glucose-Capillary: 128 mg/dL — ABNORMAL HIGH (ref 70–99)
Glucose-Capillary: 120 mg/dL — ABNORMAL HIGH (ref 70–99)

## 2022-09-02 LAB — CBC
HCT: 23.4 % — ABNORMAL LOW (ref 36.0–46.0)
Hemoglobin: 7.7 g/dL — ABNORMAL LOW (ref 12.0–15.0)
MCH: 28.5 pg (ref 26.0–34.0)
MCHC: 32.9 g/dL (ref 30.0–36.0)
MCV: 86.7 fL (ref 80.0–100.0)
Platelets: 207 10*3/uL (ref 150–400)
RBC: 2.7 MIL/uL — ABNORMAL LOW (ref 3.87–5.11)
RDW: 15.9 % — ABNORMAL HIGH (ref 11.5–15.5)
WBC: 7.7 10*3/uL (ref 4.0–10.5)
nRBC: 0 % (ref 0.0–0.2)

## 2022-09-02 MED ORDER — INSULIN ASPART 100 UNIT/ML IJ SOLN: 0.0000 [IU] | Freq: Three times a day (TID) | INTRAMUSCULAR | Status: DC

## 2022-09-02 MED ORDER — INSULIN ASPART 100 UNIT/ML IJ SOLN: 0.0000 [IU] | Freq: Every day | INTRAMUSCULAR | Status: DC

## 2022-09-02 NOTE — Care Management Important Message (Signed)
Important Message  Patient Details  Name: Dana [name] MRN: [id number] Date of Birth: 16-Feb-1946   Medicare Important Message Given:  Yes     [name] 09/02/2022, 1:17 PM

## 2022-09-02 NOTE — Progress Notes (Signed)
Mobility Specialist - Progress Note   09/02/22 1400  Mobility  Activity Ambulated independently in room  Level of Assistance Independent  Assistive Device None  Distance Ambulated (ft) 8 ft  Activity Response Tolerated well  $Mobility charge 1 Mobility   Candie Mile Mobility Specialist 09/02/22 2:53 PM

## 2022-09-02 NOTE — Inpatient Diabetes Management (Signed)
Inpatient Diabetes Program Recommendations  AACE/ADA: New Consensus Statement on Inpatient Glycemic Control   Target Ranges:  Prepandial:   less than 140 mg/dL      Peak postprandial:   less than 180 mg/dL (1-2 hours)      Critically ill patients:  140 - 180 mg/dL    Latest Reference Range & Units 09/02/22 07:36  Glucose-Capillary 70 - 99 mg/dL 86    Latest Reference Range & Units 09/01/22 16:21 09/01/22 20:39 09/01/22 20:41 09/01/22 21:29 09/01/22 22:00 09/01/22 22:35  Glucose-Capillary 70 - 99 mg/dL 151 (H)  Novolog 2 units @ 18:20 38 (LL) 36 (LL) 50 (L) 67 (L) 89    Latest Reference Range & Units 09/01/22 08:17 09/01/22 11:48  Glucose-Capillary 70 - 99 mg/dL 81 100 (H)   Review of Glycemic Control  Diabetes history: DM2 Outpatient Diabetes medications: Levemir 10 units daily (per home med list pt takes 30 units daily), Jardiance 10 mg daily Current orders for Inpatient glycemic control: Semglee 15 units QHS, Novolog 0-9 units TID with meals, Novolog 0-5 units QHS  Inpatient Diabetes Program Recommendations:    Insulin: No Semglee given last night and fasting CBG 86 mg/dl this morning. Patient received Novolog 2 units at 18:20 on 09/01/22 and experienced recurrent hypoglycemia several times last night. Please discontinue Semglee and decrease Novolog correction to 0-6 units TID.  Thanks, Barnie Alderman, RN, MSN, [place] Diabetes Coordinator Inpatient Diabetes Program [phone] (Team Pager from 8am to [place])

## 2022-09-02 NOTE — TOC Initial Note (Signed)
Transition of Care [facility]) - Initial/Assessment Note    Patient Details  Name: Dana [name] MRN: [id number] Date of Birth: May 15, 1946  Transition of [facility]) CM/SW Contact:    Beverly Sessions, RN Phone Number: 09/02/2022, 1:51 PM  Clinical Narrative:                  Admitted for: GI bleed.  Admitted from:Lives at the [facility] with her [place] [facility]CP: [facility] - denies issues obtaining medications  Current home health/prior home health/DME: NA       Transition of Care Department [facility]) has reviewed patient and no TOC needs have been identified at this time. We will continue to monitor patient advancement through interdisciplinary progression rounds. If new patient transition needs arise, please place a TOC consult.       Patient Goals and CMS Choice        Expected Discharge Plan and Services                                                Prior Living Arrangements/Services                       Activities of Daily Living Home Assistive Devices/Equipment: None ADL Screening (condition at time of admission) Patient's cognitive ability adequate to safely complete daily activities?: Yes Is the patient deaf or have difficulty hearing?: No Does the patient have difficulty seeing, even when wearing glasses/contacts?: No Does the patient have difficulty concentrating, remembering, or making decisions?: No Patient able to express need for assistance with ADLs?: Yes Does the patient have difficulty dressing or bathing?: No Independently performs ADLs?: Yes (appropriate for developmental age) Does the patient have difficulty walking or climbing stairs?: No Weakness of Legs: None Weakness of Arms/Hands: None  Permission Sought/Granted                  Emotional Assessment              Admission diagnosis:  GI bleeding [K92.2] Symptomatic anemia [D64.9] Gastrointestinal hemorrhage, unspecified  gastrointestinal hemorrhage type [K92.2] Patient Active Problem List   Diagnosis Date Noted   GI bleeding 08/30/2022   Acute blood loss anemia (ABLA) 08/30/2022   Type II diabetes mellitus with renal manifestations ([place]) 08/30/2022   CKD (chronic kidney disease), stage V ([place]) 08/30/2022   HTN (hypertension) 08/30/2022   Hypomagnesemia 03/15/2022   Generalized weakness 03/13/2022   Left leg pain 68/34/1962   Metabolic acidosis 22/97/9892   Hypoglycemia 03/10/2022   Acute UTI 03/10/2022   Right ureteral calculus 03/10/2022   AKI (acute kidney injury) ([place]) 03/08/2022   Dyslipidemia 03/08/2022   Acute unilateral obstructive uropathy 03/08/2022   PVD (peripheral vascular disease) ([place]) 01/21/2021   Hypertension 04/12/2020   Multinodular goiter 04/12/2020   Symptomatic anemia 04/12/2020   Vertebral artery stenosis 04/12/2020   Type 2 diabetes mellitus with diabetic nephropathy, with long-term current use of insulin ([place]) 01/25/2020   DM (diabetes mellitus), type 1 ([place])    PCP:  Gladstone Lighter, MD Pharmacy:   [id number] [address], [place] [address] Phone: [phone] Fax: [phone]     Social Determinants of Health (SDOH) Interventions    Readmission Risk Interventions    09/02/2022    1:50 PM 03/09/2022  11:45 AM  Readmission Risk Prevention Plan  Transportation Screening Complete Complete  PCP or Specialist Appt within 3-5 Days  Complete  HRI or Home Care Consult  Complete  Social Work Consult for [place] Planning/Counseling Complete Complete  Palliative Care Screening Not Applicable Not Applicable  Medication Review Press photographer) Complete Complete

## 2022-09-02 NOTE — Progress Notes (Signed)
[facility]  ROUNDING NOTE   Subjective:   Patient seen resting quietly Alert and oriented No family at bedside Maintains appropriate appetite No lower extremity edema  Objective:  Vital signs in last 24 hours:  Temp:  [97.9 F (36.6 C)-98.3 F (36.8 C)] 98.1 F (36.7 C) (09/12 0740) Pulse Rate:  [72-85] 72 (09/12 0740) Resp:  [14-18] 16 (09/12 0740) BP: (138-162)/(63-72) 160/72 (09/12 0740) SpO2:  [94 %-100 %] 94 % (09/12 0740)  Weight change:  Filed Weights   08/30/22 2032 09/01/22 0500  Weight: 65.7 kg 65.3 kg    Intake/Output: I/O last 3 completed shifts: In: 680 [P.O.:120; Blood:320; Other:240] Out: -    Intake/Output this shift:  No intake/output data recorded.  Physical Exam: General: NAD  Head: Normocephalic, atraumatic. Moist oral mucosal membranes  Eyes: Anicteric  Lungs:  Clear to auscultation, normal effort  Heart: Regular rate and rhythm  Abdomen:  Soft, nontender  Extremities:  No peripheral edema.  Neurologic: Nonfocal, moving all four extremities  Skin: No lesions  Access: None    Basic Metabolic Panel: Recent Labs  Lab 08/30/22 1600 08/31/22 0453 09/01/22 0447 09/02/22 0413  NA 137 141 140 140  K 4.9 4.5 4.3 4.7  CL 114* 117* 117* 115*  CO2 17* 19* 18* 21*  GLUCOSE 131* 88 88 98  BUN 46* 42* 39* 51*  CREATININE 3.67* 3.59* 3.59* 3.62*  CALCIUM 8.1* 8.0* 8.2* 8.2*     Liver Function Tests: Recent Labs  Lab 08/30/22 1600  AST 19  ALT 11  ALKPHOS 62  BILITOT 0.4  PROT 5.9*  ALBUMIN 2.8*    No results for input(s): "LIPASE", "AMYLASE" in the last 168 hours. No results for input(s): "AMMONIA" in the last 168 hours.  CBC: Recent Labs  Lab 08/30/22 1600 08/31/22 0453 08/31/22 1221 08/31/22 1730 09/01/22 0447 09/01/22 1900 09/02/22 0413  WBC 9.3  8.7 8.4 7.5 8.4 7.5  --  7.7  NEUTROABS 5.3  --   --   --   --   --   --   HGB 4.3*  4.5* 7.3* 7.4* 7.1* 6.6* 7.5* 7.7*  HCT 14.4*  15.3* 22.2* 22.5*  21.4* 19.9* 22.3* 23.4*  MCV 97.3  98.7 87.7 87.5 88.1 87.7  --  86.7  PLT 233  228 198 205 153 196  --  207     Cardiac Enzymes: No results for input(s): "CKTOTAL", "CKMB", "CKMBINDEX", "TROPONINI" in the last 168 hours.  BNP: Invalid input(s): "POCBNP"  CBG: Recent Labs  Lab 09/01/22 2129 09/01/22 2200 09/01/22 2235 09/02/22 0736 09/02/22 1150  GLUCAP 50* 67* 89 86 156*     Microbiology: Results for orders placed or performed during the hospital encounter of 03/08/22  Surgical pcr screen     Status: None   Collection Time: 03/08/22  8:11 PM   Specimen: Nasal Mucosa; Nasal Swab  Result Value Ref Range Status   MRSA, PCR NEGATIVE NEGATIVE Final   Staphylococcus aureus NEGATIVE NEGATIVE Final    Comment: (NOTE) The Xpert SA Assay (FDA approved for NASAL specimens in patients 65 years of age and older), is one component of a comprehensive surveillance program. It is not intended to diagnose infection nor to guide or monitor treatment. Performed at [facility], [address], [place], [place] [address]   Urine Culture     Status: None   Collection Time: 03/08/22 10:30 PM   Specimen: PATH Other; Urine  Result Value Ref Range Status  Specimen Description   Final    CYSTOSCOPY Performed at [facility] [address] [place], [place] [address]    Special Requests   Final    NONE Performed at [facility], [address] [place], [place] [address]    Culture   Final    NO GROWTH Performed at [facility], [place] [address], [place], [place] [address]    Report Status 03/10/2022 FINAL  Final    Coagulation Studies: Recent Labs    08/31/22 0453  LABPROT 16.3*  INR 1.3*     Urinalysis: No results for input(s): "COLORURINE", "LABSPEC", "PHURINE", "GLUCOSEU", "HGBUR", "BILIRUBINUR", "KETONESUR", "PROTEINUR", "UROBILINOGEN", "NITRITE", "LEUKOCYTESUR" in the last 72 hours.  Invalid input(s): "APPERANCEUR"    Imaging: No  results found.   Medications:     sodium chloride   Intravenous Once   amLODipine  10 mg Oral Daily   ferrous sulfate  325 mg Oral Q breakfast   sodium bicarbonate  650 mg Oral BID   acetaminophen, hydrALAZINE, ondansetron (ZOFRAN) IV  Assessment/ Plan:  [name] is a 76 y.o.  female with a PMHx of hypertension, hyperlipidemia, diabetes, peripheral vascular disease, nephrolithiasis, anemia, CKD stage V now admitted with history of hemoglobin of 4.5.  She had 3 units of blood transfusion.  Acute kidney injury on CKD stage IV:  Acute kidney injury likely due to hypovolemia. Renal function slowly improving after blood transfusion. No acute need for dialysis at this time, monitoring closely as renal function was poor prior.   Renal function appears stable at this time. Adequate urine output. Will schedule follow up appt with our office in 1-2 weeks at discharge.   Lab Results  Component Value Date   CREATININE 3.62 (H) 09/02/2022   CREATININE 3.59 (H) 09/01/2022   CREATININE 3.59 (H) 08/31/2022   No intake or output data in the 24 hours ending 09/02/22 1500  2. Anemia of chronic kidney disease Lab Results  Component Value Date   HGB 7.7 (L) 09/02/2022    Patient has received 3 units of blood during this admission and is scheduled to receive one unit today. EGD negative, colonoscopy showed diverticulitis.   No reports of bleeding  3. Hypertension with chronic kidney disease. Home regimen includes amlodipine and HCTZ. Currently receiving Amlodipine. Blood pressure stable  4. Diabetes mellitus type II with chronic kidney disease/renal manifestations: noninsulin dependent. Most recent hemoglobin A1c is 7.1 on 03/11/22.  Jardiance held   LOS: [place] 9/12/20233:00 PM

## 2022-09-02 NOTE — Anesthesia Postprocedure Evaluation (Signed)
Anesthesia Post Note  Patient: Corporate treasurer  Procedure(s) Performed: ESOPHAGOGASTRODUODENOSCOPY (EGD) COLONOSCOPY  Patient location during evaluation: Endoscopy Anesthesia Type: General Level of consciousness: awake and alert Pain management: pain level controlled Vital Signs Assessment: post-procedure vital signs reviewed and stable Respiratory status: spontaneous breathing, nonlabored ventilation, respiratory function stable and patient connected to nasal cannula oxygen Cardiovascular status: blood pressure returned to baseline and stable Postop Assessment: no apparent nausea or vomiting Anesthetic complications: no   No notable events documented.   Last Vitals:  Vitals:   09/02/22 0524 09/02/22 0740  BP: (!) 154/72 (!) 160/72  Pulse: 78 72  Resp: 18 16  Temp: 36.6 C 36.7 C  SpO2: 100% 94%    Last Pain:  Vitals:   09/02/22 0743  TempSrc:   PainSc: 0-No pain                 Martha Clan

## 2022-09-02 NOTE — Evaluation (Signed)
Physical Therapy Evaluation Patient Details Name: Dana [name] MRN: [id number] DOB: 22-Jan-1946 Today's Date: 09/02/2022  History of Present Illness  Dana [name] is a 67yoF who [facility] c BRBPR, Hb on arrival 4.5. PMH: HTN, HLD, DM, CKD5, kidney stonez. Pt went for EGD and colonoscopy on 08/31/22.  Clinical Impression  Pt admitted with above Dx. Pt has functional limitations due to deficits below (see "PT Problem List"). Pt able to provide details on baseline functional status. Today pt requires no physical assistance to perform bed mobility, transfers, and short distance walking, all performed at her baseline per pt report. Patient's performance this date reveals typical ability, independence, and tolerance in performing all basic mobility required for performance of activities of daily living. Pt denies any need for FU with PT at DC. Patient is at baseline, all education completed, and time is given to address all questions/concerns. No additional skilled PT services needed at this time, PT signing off. PT recommends daily ambulation ad lib or with nursing staff as needed to prevent deconditioning.    No data found.       Recommendations for follow up therapy are one component of a multi-disciplinary discharge planning process, led by the attending physician.  Recommendations may be updated based on patient status, additional functional criteria and insurance authorization.  Follow Up Recommendations No PT follow up      Assistance Recommended at Discharge None  Patient can return home with the following       Equipment Recommendations None recommended by PT  Recommendations for Other Services       Functional Status Assessment Patient has had a recent decline in their functional status and demonstrates the ability to make significant improvements in function in a reasonable and predictable amount of time.     Precautions / Restrictions Precautions Precautions:  Fall Restrictions Weight Bearing Restrictions: No      Mobility  Bed Mobility Overal bed mobility: Independent                  Transfers Overall transfer level: Modified independent                      Ambulation/Gait Ambulation/Gait assistance: Modified independent (Device/Increase time) Gait Distance (Feet): 480 Feet Assistive device: None Gait Pattern/deviations: WFL(Within Functional Limits) Gait velocity: 0.70ms        Stairs            Wheelchair Mobility    Modified Rankin (Stroke Patients Only)       Balance                                             Pertinent Vitals/Pain Pain Assessment Pain Assessment: No/denies pain    Home Living Family/patient expects to be discharged to:: Private residence Living Arrangements: Spouse/significant other Available Help at Discharge: Family;Available 24 hours/day Type of Home: 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)) Home Access: Level entry       Home Layout: One level Home Equipment: Grab bars - tub/shower;Grab bars - toilet      Prior Function Prior Level of Function : Independent/Modified Independent                     Hand Dominance   Dominant Hand: Right    Extremity/Trunk Assessment  Communication   Communication: No difficulties  Cognition Arousal/Alertness: Awake/alert Behavior During Therapy: WFL for tasks assessed/performed Overall Cognitive Status: Within Functional Limits for tasks assessed                                          General Comments      Exercises     Assessment/Plan    PT Assessment All further PT needs can be met in the next venue of care;Patient does not need any further PT services  PT Problem List Decreased strength;Cardiopulmonary status limiting activity       PT Treatment Interventions Functional mobility training;Therapeutic  activities;Gait training;Balance training    PT Goals (Current goals can be found in the Care Plan section)  Acute Rehab PT Goals PT Goal Formulation: All assessment and education complete, DC therapy Time For Goal Achievement: 09/16/22    Frequency       Co-evaluation               AM-PAC PT "6 Clicks" Mobility  Outcome Measure Help needed turning from your back to your side while in a flat bed without using bedrails?: None Help needed moving from lying on your back to sitting on the side of a flat bed without using bedrails?: None Help needed moving to and from a bed to a chair (including a wheelchair)?: None Help needed standing up from a chair using your arms (e.g., wheelchair or bedside chair)?: None Help needed to walk in hospital room?: A Little Help needed climbing 3-5 steps with a railing? : A Little 6 Click Score: 22    End of Session Equipment Utilized During Treatment: Gait belt Activity Tolerance: Patient tolerated treatment well;No increased pain Patient left: in bed;with call bell/phone within reach;with bed alarm set Nurse Communication: Mobility status PT Visit Diagnosis: Difficulty in walking, not elsewhere classified (R26.2);Dizziness and giddiness (R42)    Time: 9518-8416 PT Time Calculation (min) (ACUTE ONLY): 17 min   Charges:   PT Evaluation $PT Eval Low Complexity: 1 Low         12:03 PM, 09/02/22 Etta Grandchild, PT, DPT Physical Therapist - [facility]  [phone] ([place])    Trellis Guirguis C 09/02/2022, 12:02 PM

## 2022-09-02 NOTE — Progress Notes (Signed)
PROGRESS NOTE   HPI was taken from Dr. Blaine Hamper: Dana [name] is a 76 y.o. female with medical history significant of hypertension, hyperlipidemia, diabetes mellitus, CKD-5, anemia, kidney stone, who presents with rectal bleeding.   Patient states that she started having melena since Thursday, yesterday she noticed bright red blood.  She has at least 7 times of rectal bleeding.  She has nausea, no vomiting, diarrhea or abdominal pain.  No symptoms of UTI.  Denies chest pain, cough, shortness breath.  No fever or chills.  She has not had any history no fall.   Data reviewed independently and ED Course: pt was found to have hemoglobin 4.5 (8.1 on 03/30/2022), WBC 8.7, slightly worsening renal function, temperature normal, blood pressure 124/65, heart rate 88, RR 17, oxygen saturation 100% on room air.  Patient is admitted to PCU as inpatient.  Dr. Haig Prophet of GI and Dr. Clayton Bibles of renal were consulted.    Jeylin Woodmansee  LTJ:[id number] DOB: September 24, 1946 DOA: 08/30/2022 PCP: Gladstone Lighter, MD    Assessment & Plan:   Principal Problem:   GI bleeding Active Problems:   Acute blood loss anemia (ABLA)   CKD (chronic kidney disease), stage V (HCC)   Type II diabetes mellitus with renal manifestations (HCC)   HTN (hypertension)  Assessment and Plan: GI bleed: secondary to likely diverticular bleed. D/c PPI. S/p 3 units of pRBCs transfused so far. H&H are trending up today. S/p EGD which showed normal esophagus, stomach & erythematous duodenopathy. S/p colonoscopy which showed examined portion of ileum was normal, blood in the entire examined colon, extensive diverticulosis in colon, this was likely a diverticular bleed that has resolved. No bleeding today so far. If another episode of bleeding occurs, will order a tagged RBC scan  Acute blood loss anemia: secondary to likely diverticular bleed. S/p 3 units of pRBCs transfused so far. H&H are trending up today. Will continue to monitor  AKI on  CKDV: baseline Cr 2.5-3.0. Cr is trending up from day prior   DM2: poorly controlled, HbA1c 7.1. Hypoglycemic episodes overnight so SSI was d/c as BS since admission have been well controlled. If BS starts to rise, consider started a reduced home dose of levemir. Pt usually takes levemir, jardiance at home    HTN: continue on amlodipine.   Fall: at home. PT recs no further follow up      DVT prophylaxis: SCDs Code Status: full  Family Communication: Disposition Plan: will likely d/c back home  Level of care: Med-Surg  Status is: Inpatient Remains inpatient appropriate because: severity of illness, if H&H stable & no more hypoglycemic episodes, pt can d/c home tomorrow     Consultants:  GI   Procedures:   Antimicrobials:    Subjective: Pt c/o fatigue   Objective: Vitals:   09/01/22 1819 09/01/22 2005 09/02/22 0524 09/02/22 0740  BP: (!) 162/69 138/63 (!) 154/72 (!) 160/72  Pulse: 85 80 78 72  Resp: 14 17 18 16   Temp: 98.3 F (36.8 C) 98.2 F (36.8 C) 97.9 F (36.6 C) 98.1 F (36.7 C)  TempSrc: Oral  Oral   SpO2: 100% 99% 100% 94%  Weight:      Height:        Intake/Output Summary (Last 24 hours) at 09/02/2022 0741 Last data filed at 09/01/2022 1256 Gross per 24 hour  Intake 440 ml  Output --  Net 440 ml   Filed Weights   08/30/22 2032 09/01/22 0500  Weight: 65.7 kg 65.3 kg  Examination:  General exam: Appears calm & comfortable  Respiratory system: clear breath sounds b/l. No wheezes, rales or rhonchi  Cardiovascular system: S1 & S2+. No rubs or clicks  Gastrointestinal system: Abd is soft, NT, ND & normal bowel sounds  Central nervous system: Alert and oriented. Moves all extremities  Psychiatry: judgement and insight appears normal. Appropriate mood and affect     Data Reviewed: I have personally reviewed following labs and imaging studies  CBC: Recent Labs  Lab 08/30/22 1600 08/31/22 0453 08/31/22 1221 08/31/22 1730 09/01/22 0447  09/01/22 1900 09/02/22 0413  WBC 9.3  8.7 8.4 7.5 8.4 7.5  --  7.7  NEUTROABS 5.3  --   --   --   --   --   --   HGB 4.3*  4.5* 7.3* 7.4* 7.1* 6.6* 7.5* 7.7*  HCT 14.4*  15.3* 22.2* 22.5* 21.4* 19.9* 22.3* 23.4*  MCV 97.3  98.7 87.7 87.5 88.1 87.7  --  86.7  PLT 233  228 198 205 153 196  --  350   Basic Metabolic Panel: Recent Labs  Lab 08/30/22 1600 08/31/22 0453 09/01/22 0447 09/02/22 0413  NA 137 141 140 140  K 4.9 4.5 4.3 4.7  CL 114* 117* 117* 115*  CO2 17* 19* 18* 21*  GLUCOSE 131* 88 88 98  BUN 46* 42* 39* 51*  CREATININE 3.67* 3.59* 3.59* 3.62*  CALCIUM 8.1* 8.0* 8.2* 8.2*   GFR: Estimated Creatinine Clearance: 11.7 mL/min (A) (by C-G formula based on SCr of 3.62 mg/dL (H)). Liver Function Tests: Recent Labs  Lab 08/30/22 1600  AST 19  ALT 11  ALKPHOS 62  BILITOT 0.4  PROT 5.9*  ALBUMIN 2.8*   No results for input(s): "LIPASE", "AMYLASE" in the last 168 hours. No results for input(s): "AMMONIA" in the last 168 hours. Coagulation Profile: Recent Labs  Lab 08/31/22 0453  INR 1.3*   Cardiac Enzymes: No results for input(s): "CKTOTAL", "CKMB", "CKMBINDEX", "TROPONINI" in the last 168 hours. BNP (last 3 results) No results for input(s): "PROBNP" in the last 8760 hours. HbA1C: No results for input(s): "HGBA1C" in the last 72 hours. CBG: Recent Labs  Lab 09/01/22 2041 09/01/22 2129 09/01/22 2200 09/01/22 2235 09/02/22 0736  GLUCAP 36* 50* 67* 89 86   Lipid Profile: No results for input(s): "CHOL", "HDL", "LDLCALC", "TRIG", "CHOLHDL", "LDLDIRECT" in the last 72 hours. Thyroid Function Tests: No results for input(s): "TSH", "T4TOTAL", "FREET4", "T3FREE", "THYROIDAB" in the last 72 hours. Anemia Panel: Recent Labs    08/30/22 1601 08/31/22 0453  VITAMINB12  --  887  FOLATE >40.0  --   FERRITIN 38  --   TIBC 262  --   IRON 86  --   RETICCTPCT 2.9  --    Sepsis Labs: No results for input(s): "PROCALCITON", "LATICACIDVEN" in the last 168  hours.  No results found for this or any previous visit (from the past 240 hour(s)).       Radiology Studies: No results found.      Scheduled Meds:  sodium chloride   Intravenous Once   amLODipine  10 mg Oral Daily   ferrous sulfate  325 mg Oral Q breakfast   insulin aspart  0-5 Units Subcutaneous QHS   insulin aspart  0-9 Units Subcutaneous TID WC   insulin glargine-yfgn  15 Units Subcutaneous QHS   sodium bicarbonate  650 mg Oral BID   Continuous Infusions:   LOS: 3 days    Time spent: 35 mins  Wyvonnia Dusky, MD [facility] Pager [phone]  If 7PM-7AM, please contact night-coverage [email or web address] 09/02/2022, 7:41 AM

## 2022-09-03 DIAGNOSIS — N184 Chronic kidney disease, stage 4 (severe): Secondary | ICD-10-CM | POA: Diagnosis not present

## 2022-09-03 DIAGNOSIS — K5731 Diverticulosis of large intestine without perforation or abscess with bleeding: Secondary | ICD-10-CM | POA: Diagnosis not present

## 2022-09-03 DIAGNOSIS — N179 Acute kidney failure, unspecified: Secondary | ICD-10-CM | POA: Diagnosis not present

## 2022-09-03 DIAGNOSIS — E663 Overweight: Secondary | ICD-10-CM | POA: Diagnosis not present

## 2022-09-03 DIAGNOSIS — D62 Acute posthemorrhagic anemia: Secondary | ICD-10-CM | POA: Diagnosis not present

## 2022-09-03 DIAGNOSIS — D631 Anemia in chronic kidney disease: Secondary | ICD-10-CM | POA: Diagnosis not present

## 2022-09-03 DIAGNOSIS — E1122 Type 2 diabetes mellitus with diabetic chronic kidney disease: Secondary | ICD-10-CM | POA: Diagnosis not present

## 2022-09-03 DIAGNOSIS — N185 Chronic kidney disease, stage 5: Secondary | ICD-10-CM | POA: Diagnosis not present

## 2022-09-03 LAB — CBC
HCT: 21.9 % — ABNORMAL LOW (ref 36.0–46.0)
Hemoglobin: 7.1 g/dL — ABNORMAL LOW (ref 12.0–15.0)
MCH: 29 pg (ref 26.0–34.0)
MCHC: 32.4 g/dL (ref 30.0–36.0)
MCV: 89.4 fL (ref 80.0–100.0)
Platelets: 205 10*3/uL (ref 150–400)
RBC: 2.45 MIL/uL — ABNORMAL LOW (ref 3.87–5.11)
RDW: 15.5 % (ref 11.5–15.5)
WBC: 8.2 10*3/uL (ref 4.0–10.5)
nRBC: 0 % (ref 0.0–0.2)

## 2022-09-03 LAB — BASIC METABOLIC PANEL
Anion gap: 5 (ref 5–15)
BUN: 59 mg/dL — ABNORMAL HIGH (ref 8–23)
CO2: 19 mmol/L — ABNORMAL LOW (ref 22–32)
Calcium: 8.2 mg/dL — ABNORMAL LOW (ref 8.9–10.3)
Chloride: 117 mmol/L — ABNORMAL HIGH (ref 98–111)
Creatinine, Ser: 3.35 mg/dL — ABNORMAL HIGH (ref 0.44–1.00)
GFR, Estimated: 14 mL/min — ABNORMAL LOW (ref >60)
Glucose, Bld: 104 mg/dL — ABNORMAL HIGH (ref 70–99)
Potassium: 4.5 mmol/L (ref 3.5–5.1)
Sodium: 141 mmol/L (ref 135–145)

## 2022-09-03 LAB — GLUCOSE, CAPILLARY
Glucose-Capillary: 164 mg/dL — ABNORMAL HIGH (ref 70–99)
Glucose-Capillary: 111 mg/dL — ABNORMAL HIGH (ref 70–99)
Glucose-Capillary: 115 mg/dL — ABNORMAL HIGH (ref 70–99)

## 2022-09-03 MED ORDER — SODIUM CHLORIDE 0.9 % IV SOLN: INTRAVENOUS | Status: DC

## 2022-09-03 NOTE — Progress Notes (Signed)
Mobility Specialist - Progress Note   09/03/22 0952  Mobility  Activity Ambulated independently in room  Level of Assistance Independent  Assistive Device None  Distance Ambulated (ft) 20 ft  Activity Response Tolerated well  $Mobility charge 1 Mobility   [place]ie [place] Mobility Specialist 09/03/22 9:52 AM

## 2022-09-03 NOTE — Assessment & Plan Note (Signed)
Meets criteria BMI greater than 25 

## 2022-09-03 NOTE — Progress Notes (Signed)
GI Inpatient Follow-up Note  Subjective:  Patient seen and patient did not have any melena. She reports maybe a quarter size amount of clot. Otherwise no significant GI bleeding.  Scheduled Inpatient Medications:   amLODipine  10 mg Oral Daily   ferrous sulfate  325 mg Oral Q breakfast   sodium bicarbonate  650 mg Oral BID    Continuous Inpatient Infusions:    sodium chloride      PRN Inpatient Medications:  acetaminophen, hydrALAZINE, ondansetron (ZOFRAN) IV  Review of Systems:  Review of Systems  Constitutional:  Negative for chills and fever.  Respiratory:  Negative for shortness of breath.   Cardiovascular:  Negative for chest pain.  Gastrointestinal:  Negative for abdominal pain, constipation, diarrhea, melena, nausea and vomiting.  Musculoskeletal:  Positive for joint pain.  Skin:  Negative for rash.  Neurological:  Negative for focal weakness.  Psychiatric/Behavioral:  Negative for substance abuse.   All other systems reviewed and are negative.     Physical Examination: BP (!) 153/64 (BP Location: Left Arm)   Pulse 77   Temp 98.2 F (36.8 C) (Oral)   Resp 18   Ht 5\' 2"  (1.575 m)   Wt 65.3 kg   SpO2 99%   BMI 26.33 kg/m  Gen: NAD, alert and oriented x 4 HEENT: PEERLA, EOMI, Neck: supple, no JVD or thyromegaly Chest: No respiratory distress Abd: soft, non-tender, non-distended Ext: no edema, well perfused with 2+ pulses, Skin: no rash or lesions noted Lymph: no lymphadenopathy  Data: Lab Results  Component Value Date   WBC 8.2 09/03/2022   HGB 7.1 (L) 09/03/2022   HCT 21.9 (L) 09/03/2022   MCV 89.4 09/03/2022   PLT 205 09/03/2022   Recent Labs  Lab 09/01/22 1900 09/02/22 0413 09/03/22 0411  HGB 7.5* 7.7* 7.1*   Lab Results  Component Value Date   NA 141 09/03/2022   K 4.5 09/03/2022   CL 117 (H) 09/03/2022   CO2 19 (L) 09/03/2022   BUN 59 (H) 09/03/2022   CREATININE 3.35 (H) 09/03/2022   Lab Results  Component Value Date   ALT 11  08/30/2022   AST 19 08/30/2022   ALKPHOS 62 08/30/2022   BILITOT 0.4 08/30/2022   Recent Labs  Lab 08/31/22 0453  APTT 36  INR 1.3*   Assessment/Plan: Dana [name] is a 76 y.o. lady with CKD 4-5 here with hematochezia likely secondary to diverticular bleeding. Seems to have resolved. Small amount of residual clot.  Recommendations:  - transfuse to keep hemoglobin > 7 - maintain active type and screen - trend daily CBC's - appreciate nephrology recommendations - baseline hemoglobin recently has been around 8 - monitor for any recurrent overt GI bleeding, if hemodynamically significant then would need to decide CTA vs tagged RBC scan given kidney issues   Please call with any questions or concerns  Raylene Miyamoto MD, MPH [facility]

## 2022-09-03 NOTE — Progress Notes (Signed)
[facility]  ROUNDING NOTE   Subjective:   Patient resting comfortably in bed Alert and oriented Reports melena with stool today, she has notified nurse   Objective:  Vital signs in last 24 hours:  Temp:  [98.1 F (36.7 C)-98.2 F (36.8 C)] 98.2 F (36.8 C) (09/13 0833) Pulse Rate:  [77-82] 77 (09/13 0833) Resp:  [16-18] 18 (09/13 0833) BP: (146-160)/(64-76) 153/64 (09/13 0833) SpO2:  [99 %-100 %] 99 % (09/13 0833)  Weight change:  Filed Weights   08/30/22 2032 09/01/22 0500  Weight: 65.7 kg 65.3 kg    Intake/Output: No intake/output data recorded.   Intake/Output this shift:  No intake/output data recorded.  Physical Exam: General: NAD  Head: Normocephalic, atraumatic. Moist oral mucosal membranes  Eyes: Anicteric  Lungs:  Clear to auscultation, normal effort  Heart: Regular rate and rhythm  Abdomen:  Soft, nontender  Extremities:  No peripheral edema.  Neurologic: Nonfocal, moving all four extremities  Skin: No lesions  Access: None    Basic Metabolic Panel: Recent Labs  Lab 08/30/22 1600 08/31/22 0453 09/01/22 0447 09/02/22 0413 09/03/22 0411  NA 137 141 140 140 141  K 4.9 4.5 4.3 4.7 4.5  CL 114* 117* 117* 115* 117*  CO2 17* 19* 18* 21* 19*  GLUCOSE 131* 88 88 98 104*  BUN 46* 42* 39* 51* 59*  CREATININE 3.67* 3.59* 3.59* 3.62* 3.35*  CALCIUM 8.1* 8.0* 8.2* 8.2* 8.2*     Liver Function Tests: Recent Labs  Lab 08/30/22 1600  AST 19  ALT 11  ALKPHOS 62  BILITOT 0.4  PROT 5.9*  ALBUMIN 2.8*    No results for input(s): "LIPASE", "AMYLASE" in the last 168 hours. No results for input(s): "AMMONIA" in the last 168 hours.  CBC: Recent Labs  Lab 08/30/22 1600 08/31/22 0453 08/31/22 1221 08/31/22 1730 09/01/22 0447 09/01/22 1900 09/02/22 0413 09/03/22 0411  WBC 9.3  8.7   < > 7.5 8.4 7.5  --  7.7 8.2  NEUTROABS 5.3  --   --   --   --   --   --   --   HGB 4.3*  4.5*   < > 7.4* 7.1* 6.6* 7.5* 7.7* 7.1*  HCT 14.4*   15.3*   < > 22.5* 21.4* 19.9* 22.3* 23.4* 21.9*  MCV 97.3  98.7   < > 87.5 88.1 87.7  --  86.7 89.4  PLT 233  228   < > 205 153 196  --  207 205   < > = values in this interval not displayed.     Cardiac Enzymes: No results for input(s): "CKTOTAL", "CKMB", "CKMBINDEX", "TROPONINI" in the last 168 hours.  BNP: Invalid input(s): "POCBNP"  CBG: Recent Labs  Lab 09/02/22 0736 09/02/22 1150 09/02/22 1655 09/02/22 2104 09/03/22 1132  GLUCAP 86 156* 128* 120* 164*     Microbiology: Results for orders placed or performed during the hospital encounter of 03/08/22  Surgical pcr screen     Status: None   Collection Time: 03/08/22  8:11 PM   Specimen: Nasal Mucosa; Nasal Swab  Result Value Ref Range Status   MRSA, PCR NEGATIVE NEGATIVE Final   Staphylococcus aureus NEGATIVE NEGATIVE Final    Comment: (NOTE) The Xpert SA Assay (FDA approved for NASAL specimens in patients 8 years of age and older), is one component of a comprehensive surveillance program. It is not intended to diagnose infection nor to guide or monitor treatment. Performed at [facility], [phone]  [address] [place], [place] [address]   Urine Culture     Status: None   Collection Time: 03/08/22 10:30 PM   Specimen: PATH Other; Urine  Result Value Ref Range Status   Specimen Description   Final    CYSTOSCOPY Performed at [facility], [place] [address] [place], [place] [address]    Special Requests   Final    NONE Performed at [facility], [address] [place], [place] [address]    Culture   Final    NO GROWTH Performed at [facility], [place] [address], [place], [place] [address]    Report Status 03/10/2022 FINAL  Final    Coagulation Studies: No results for input(s): "LABPROT", "INR" in the last 72 hours.   Urinalysis: No results for input(s): "COLORURINE", "LABSPEC", "PHURINE", "GLUCOSEU", "HGBUR", "BILIRUBINUR", "KETONESUR", "PROTEINUR", "UROBILINOGEN", "NITRITE",  "LEUKOCYTESUR" in the last 72 hours.  Invalid input(s): "APPERANCEUR"    Imaging: No results found.   Medications:     sodium chloride   Intravenous Once   amLODipine  10 mg Oral Daily   ferrous sulfate  325 mg Oral Q breakfast   sodium bicarbonate  650 mg Oral BID   acetaminophen, hydrALAZINE, ondansetron (ZOFRAN) IV  Assessment/ Plan:  [name] is a 76 y.o.  female with a PMHx of hypertension, hyperlipidemia, diabetes, peripheral vascular disease, nephrolithiasis, anemia, CKD stage V now admitted with history of hemoglobin of 4.5.  She had 3 units of blood transfusion.  Acute kidney injury on CKD stage IV:  Acute kidney injury likely due to hypovolemia. Renal function slowly improving after blood transfusion. No acute need for dialysis at this time, monitoring closely as renal function was poor prior.   Renal function at baseline for this patient. Continues to have adequate urine output.   Lab Results  Component Value Date   CREATININE 3.35 (H) 09/03/2022   CREATININE 3.62 (H) 09/02/2022   CREATININE 3.59 (H) 09/01/2022   No intake or output data in the 24 hours ending 09/03/22 1152  2. Anemia of chronic kidney disease Lab Results  Component Value Date   HGB 7.1 (L) 09/03/2022    Patient has received 3 units of blood during this admission and is scheduled to receive one unit today. EGD negative, colonoscopy showed diverticulitis.   Melena reported this morning, Hgb decreased. Will defer to primary team the need for blood transfusion.   3. Hypertension with chronic kidney disease. Home regimen includes amlodipine and HCTZ. Currently receiving Amlodipine. Blood pressure 153/64  4. Diabetes mellitus type II with chronic kidney disease/renal manifestations: noninsulin dependent. Most recent hemoglobin A1c is 7.1 on 03/11/22.  Jardiance held   LOS: 4 [place] 9/13/202311:52 AM

## 2022-09-03 NOTE — Progress Notes (Signed)
[facility] Progress Note  Patient: Dana [name]    HGD:[id number]  DOA: 08/30/2022    Date of Service: the patient was seen and examined on 09/03/2022  Brief hospital course: 76 year old female with past medical history of hypertension, diabetes mellitus, stage V chronic kidney disease who presented to the emergency room on 9/9 with rectal bleeding.  Patient was found to have a hemoglobin of 4.5 (had been 8.5 on 4/9) with some additional acute kidney injury.  Patient transfused 3 units packed red blood cells.  Gastroenterology consulted who performed an EGD which noted some erythematous duodenopathy however colonoscopy noted blood in the entire examined colon along with extensive diverticulosis and bleeding felt to be a diverticular bleed.   Assessment and Plan: Assessment and Plan: * Diverticulosis of intestine with bleeding GI consulted.  Status post colonoscopy noting extensive diverticulosis of blood throughout colon.  Still some bleeding, but looks to be more residual.  Continue to follow.  Acute blood loss anemia (ABLA) -see above -continue iron supplement.  Hemoglobin slightly lower today, suspect this is secondary to previous hemoconcentration.  Have started some gentle IV fluids.  Acute kidney injury in the setting of CKD (chronic kidney disease), stage V (HCC) Recent baseline creatinine 2.5-3.0.  Creatinine on admission at 3.67 Secondary to acute blood loss anemia.  Improving.  We have started some gentle IV fluids.  Type II diabetes mellitus with renal manifestations (HCC) Recent A1c 7.1, poorly controlled.  Patient is taking Jardiance and Levemir 30 units daily.  Sliding scale discontinued after patient started having episodes of hypoglycemia.  HTN (hypertension) Blood pressure has been trending upward, likely due to additional volume from blood.  Home medications plus as needed hydralazine.   Overweight (BMI 25.0-29.9) Meets criteria BMI greater than  25       Body mass index is 26.33 kg/m.        Consultants: Gastroenterology Nephrology  Procedures: Status post colonoscopy and endoscopy  Antimicrobials: None  Code Status: Full code   Subjective: Patient doing okay, no complaints  Objective: Vital signs were reviewed and unremarkable. Vitals:   09/03/22 0548 09/03/22 0833  BP: (!) 154/66 (!) 153/64  Pulse: 79 77  Resp: 16 18  Temp: 98.1 F (36.7 C) 98.2 F (36.8 C)  SpO2: 100% 99%    Intake/Output Summary (Last 24 hours) at 09/03/2022 1557 Last data filed at 09/03/2022 1444 Gross per 24 hour  Intake 240 ml  Output --  Net 240 ml   Filed Weights   08/30/22 2032 09/01/22 0500  Weight: 65.7 kg 65.3 kg   Body mass index is 26.33 kg/m.  Exam:  General: Alert and oriented x3, no acute distress HEENT: Normocephalic, atraumatic, mucous membranes slightly dry Cardiovascular: Regular rate and rhythm, S1-S2 Respiratory: Clear to auscultation bilaterally Abdomen: Soft, nontender, nondistended, positive bowel sounds Musculoskeletal: No clubbing or cyanosis or edema Skin: No skin breaks, tears or lesions Psychiatry: Appropriate, no evidence of psychoses Neurology: No focal deficits  Data Reviewed: Noted improving renal function, slight drop in hemoglobin  Disposition:  Status is: Inpatient Remains inpatient appropriate because: Stabilization of renal function, ensuring no further blood needed    Anticipated discharge date: 9/14  Family Communication: Husband and nephew at the bedside DVT Prophylaxis: SCDs Start: 08/30/22 1744    Author: Annita Brod ,MD 09/03/2022 3:57 PM  To reach On-call, see care teams to locate the attending and reach out via [email or web address]. Between 7PM-7AM, please contact night-coverage If you still have difficulty reaching the attending  provider, please page the [facility] (Director on Call) for [facility] on amion for assistance.

## 2022-09-03 NOTE — Hospital Course (Addendum)
76 year old female with past medical history of hypertension, diabetes mellitus, stage V chronic kidney disease who presented to the emergency room on 9/9 with rectal bleeding.  Patient was found to have a hemoglobin of 4.5 (had been 8.5 on 4/9) with some additional acute kidney injury.  Patient transfused 3 units packed red blood cells.  Gastroenterology consulted who performed an EGD which noted some erythematous duodenopathy however colonoscopy noted blood in the entire examined colon along with extensive diverticulosis and bleeding felt to be a diverticular bleed.   Patient's hemoglobin down below 7.0 on 9/14 requiring 1 additional unit of blood.

## 2022-09-04 DIAGNOSIS — E1121 Type 2 diabetes mellitus with diabetic nephropathy: Secondary | ICD-10-CM | POA: Diagnosis not present

## 2022-09-04 DIAGNOSIS — N185 Chronic kidney disease, stage 5: Secondary | ICD-10-CM | POA: Diagnosis not present

## 2022-09-04 DIAGNOSIS — K5731 Diverticulosis of large intestine without perforation or abscess with bleeding: Secondary | ICD-10-CM | POA: Diagnosis not present

## 2022-09-04 DIAGNOSIS — D62 Acute posthemorrhagic anemia: Secondary | ICD-10-CM | POA: Diagnosis not present

## 2022-09-04 DIAGNOSIS — Z794 Long term (current) use of insulin: Secondary | ICD-10-CM | POA: Diagnosis not present

## 2022-09-04 DIAGNOSIS — E663 Overweight: Secondary | ICD-10-CM | POA: Diagnosis not present

## 2022-09-04 DIAGNOSIS — N184 Chronic kidney disease, stage 4 (severe): Secondary | ICD-10-CM | POA: Diagnosis not present

## 2022-09-04 DIAGNOSIS — N179 Acute kidney failure, unspecified: Secondary | ICD-10-CM | POA: Diagnosis not present

## 2022-09-04 DIAGNOSIS — E1122 Type 2 diabetes mellitus with diabetic chronic kidney disease: Secondary | ICD-10-CM | POA: Diagnosis not present

## 2022-09-04 DIAGNOSIS — D631 Anemia in chronic kidney disease: Secondary | ICD-10-CM | POA: Diagnosis not present

## 2022-09-04 LAB — HEMOGLOBIN AND HEMATOCRIT, BLOOD
HCT: 28.6 % — ABNORMAL LOW (ref 36.0–46.0)
Hemoglobin: 9.2 g/dL — ABNORMAL LOW (ref 12.0–15.0)

## 2022-09-04 LAB — BASIC METABOLIC PANEL
Anion gap: 4 — ABNORMAL LOW (ref 5–15)
BUN: 54 mg/dL — ABNORMAL HIGH (ref 8–23)
CO2: 19 mmol/L — ABNORMAL LOW (ref 22–32)
Calcium: 7.9 mg/dL — ABNORMAL LOW (ref 8.9–10.3)
Chloride: 117 mmol/L — ABNORMAL HIGH (ref 98–111)
Creatinine, Ser: 3.36 mg/dL — ABNORMAL HIGH (ref 0.44–1.00)
GFR, Estimated: 14 mL/min — ABNORMAL LOW (ref >60)
Glucose, Bld: 100 mg/dL — ABNORMAL HIGH (ref 70–99)
Potassium: 4.6 mmol/L (ref 3.5–5.1)
Sodium: 140 mmol/L (ref 135–145)

## 2022-09-04 LAB — CBC
HCT: 20.6 % — ABNORMAL LOW (ref 36.0–46.0)
Hemoglobin: 6.7 g/dL — ABNORMAL LOW (ref 12.0–15.0)
MCH: 29.1 pg (ref 26.0–34.0)
MCHC: 32.5 g/dL (ref 30.0–36.0)
MCV: 89.6 fL (ref 80.0–100.0)
Platelets: 215 10*3/uL (ref 150–400)
RBC: 2.3 MIL/uL — ABNORMAL LOW (ref 3.87–5.11)
RDW: 15.6 % — ABNORMAL HIGH (ref 11.5–15.5)
WBC: 7.2 10*3/uL (ref 4.0–10.5)
nRBC: 0 % (ref 0.0–0.2)

## 2022-09-04 LAB — PREPARE RBC (CROSSMATCH)

## 2022-09-04 LAB — GLUCOSE, CAPILLARY
Glucose-Capillary: 65 mg/dL — ABNORMAL LOW (ref 70–99)
Glucose-Capillary: 111 mg/dL — ABNORMAL HIGH (ref 70–99)
Glucose-Capillary: 86 mg/dL (ref 70–99)
Glucose-Capillary: 167 mg/dL — ABNORMAL HIGH (ref 70–99)

## 2022-09-04 MED ORDER — SIMETHICONE 80 MG PO CHEW
160.0000 mg | CHEWABLE_TABLET | Freq: Once | ORAL | Status: AC
  Administered 2022-09-04: 160 mg via ORAL
  Filled 2022-09-04: qty 2

## 2022-09-04 MED ORDER — FUROSEMIDE 10 MG/ML IJ SOLN
60.0000 mg | Freq: Once | INTRAMUSCULAR | Status: DC
  Filled 2022-09-04: qty 8

## 2022-09-04 MED ORDER — SODIUM CHLORIDE 0.9% IV SOLUTION: Freq: Once | INTRAVENOUS | Status: AC

## 2022-09-04 MED ORDER — FUROSEMIDE 10 MG/ML IJ SOLN
20.0000 mg | Freq: Once | INTRAMUSCULAR | Status: DC
  Filled 2022-09-04: qty 4

## 2022-09-04 MED ORDER — SODIUM CHLORIDE 0.9% IV SOLUTION: Freq: Once | INTRAVENOUS | Status: DC

## 2022-09-04 NOTE — Progress Notes (Signed)
[facility] Progress Note  Patient: Dana [name]    QIO:[id number]  DOA: 08/30/2022    Date of Service: the patient was seen and examined on 09/04/2022  Brief hospital course: 76 year old female with past medical history of hypertension, diabetes mellitus, stage V chronic kidney disease who presented to the emergency room on 9/9 with rectal bleeding.  Patient was found to have a hemoglobin of 4.5 (had been 8.5 on 4/9) with some additional acute kidney injury.  Patient transfused 3 units packed red blood cells.  Gastroenterology consulted who performed an EGD which noted some erythematous duodenopathy however colonoscopy noted blood in the entire examined colon along with extensive diverticulosis and bleeding felt to be a diverticular bleed.   Patient's hemoglobin down below 7.0 on 9/14 requiring 1 additional unit of blood.  Assessment and Plan: Assessment and Plan: * Diverticulosis of intestine with bleeding GI consulted.  Status post colonoscopy noting extensive diverticulosis of blood throughout colon.  Minimal residual bleeding.  Patient's hemoglobin below 7 on 9/14 although likely she had was lower than previously thought and this was more from hemoconcentration.  Transfusing additional unit of blood on 9/14.  Acute blood loss anemia (ABLA) As above.-Continue iron supplement  Acute kidney injury in the setting of CKD (chronic kidney disease), stage V (HCC) Recent baseline creatinine 2.5-3.0.  Creatinine on admission at 3.67 Secondary to acute blood loss anemia.  Improving.  Down to 3.36  Type II diabetes mellitus with renal manifestations (HCC) Recent A1c 7.1, poorly controlled.  Patient is taking Jardiance and Levemir 30 units daily.  Sliding scale discontinued after patient started having episodes of hypoglycemia.  HTN (hypertension) Blood pressure has been trending upward, likely due to additional volume from blood.  I have ordered some additional Lasix after blood today.  Home  medications plus as needed hydralazine.   Overweight (BMI 25.0-29.9) Meets criteria BMI greater than 25       Body mass index is 26.41 kg/m.        Consultants: Gastroenterology Nephrology  Procedures: Status post colonoscopy and endoscopy Blood transfusion x2  Antimicrobials: None  Code Status: Full code   Subjective: Patient a little tired  Objective: Vital signs were reviewed and unremarkable. Vitals:   09/04/22 1201 09/04/22 1444  BP: (!) 156/65 (!) 151/69  Pulse: 79 82  Resp: 17 18  Temp: 98.1 F (36.7 C) 98.7 F (37.1 C)  SpO2: 100% 99%    Intake/Output Summary (Last 24 hours) at 09/04/2022 1516 Last data filed at 09/04/2022 1444 Gross per 24 hour  Intake 968.18 ml  Output --  Net 968.18 ml    Filed Weights   08/30/22 2032 09/01/22 0500 09/04/22 0437  Weight: 65.7 kg 65.3 kg 65.5 kg   Body mass index is 26.41 kg/m.  Exam:  General: Alert and oriented x3, no acute distress, fatigued HEENT: Normocephalic, atraumatic, mucous membranes slightly dry Cardiovascular: Regular rate and rhythm, S1-S2 Respiratory: Clear to auscultation bilaterally Abdomen: Soft, nontender, nondistended, positive bowel sounds Musculoskeletal: No clubbing or cyanosis or edema Skin: No skin breaks, tears or lesions Psychiatry: Appropriate, no evidence of psychoses Neurology: No focal deficits  Data Reviewed: Renal function about the same.  Noted hemoglobin below 7  Disposition:  Status is: Inpatient Remains inpatient appropriate because:  Stabilization of renal function ensuring no further blood needed    Anticipated discharge date: 9/15  Family Communication: Left message for husband DVT Prophylaxis: SCDs Start: 08/30/22 1744    Author: Annita Brod ,MD 09/04/2022 3:16 PM  To reach On-call, see care teams to locate the attending and reach out via [email or web address]. Between 7PM-7AM, please contact night-coverage If you still have difficulty  reaching the attending provider, please page the [facility] (Director on Call) for [facility] on amion for assistance.

## 2022-09-04 NOTE — Progress Notes (Signed)
GI Inpatient Follow-up Note  Subjective:  Patient seen and she has had no bowel movements today. Hemoglobin dipped slightly below 7 for which she is receiving one unit of pRBC's.  Scheduled Inpatient Medications:   sodium chloride   Intravenous Once   amLODipine  10 mg Oral Daily   ferrous sulfate  325 mg Oral Q breakfast   sodium bicarbonate  650 mg Oral BID    Continuous Inpatient Infusions:    sodium chloride 50 mL/hr at 09/04/22 0010    PRN Inpatient Medications:  acetaminophen, hydrALAZINE, ondansetron (ZOFRAN) IV  Review of Systems:  Review of Systems  Constitutional:  Negative for chills, fever and malaise/fatigue.  Respiratory:  Negative for shortness of breath.   Cardiovascular:  Negative for chest pain.  Gastrointestinal:  Negative for blood in stool, constipation, nausea and vomiting.  Musculoskeletal:  Negative for joint pain.  Skin:  Negative for itching and rash.  Neurological:  Negative for focal weakness.  Psychiatric/Behavioral:  Negative for substance abuse.   All other systems reviewed and are negative.     Physical Examination: BP (!) 156/65   Pulse 79   Temp 98.1 F (36.7 C) (Oral)   Resp 17   Ht 5\' 2"  (1.575 m)   Wt 65.5 kg   SpO2 100%   BMI 26.41 kg/m  Gen: NAD, alert and oriented x 4 HEENT: PEERLA, EOMI, Chest: No respiratory distress Abd: soft, non-tender, non-distended Ext: no edema, well perfused with 2+ pulses, Skin: no rash or lesions noted Lymph: no lymphadenopathy  Data: Lab Results  Component Value Date   WBC 7.2 09/04/2022   HGB 6.7 (L) 09/04/2022   HCT 20.6 (L) 09/04/2022   MCV 89.6 09/04/2022   PLT 215 09/04/2022   Recent Labs  Lab 09/02/22 0413 09/03/22 0411 09/04/22 0356  HGB 7.7* 7.1* 6.7*   Lab Results  Component Value Date   NA 140 09/04/2022   K 4.6 09/04/2022   CL 117 (H) 09/04/2022   CO2 19 (L) 09/04/2022   BUN 54 (H) 09/04/2022   CREATININE 3.36 (H) 09/04/2022   Lab Results  Component Value  Date   ALT 11 08/30/2022   AST 19 08/30/2022   ALKPHOS 62 08/30/2022   BILITOT 0.4 08/30/2022   Recent Labs  Lab 08/31/22 0453  APTT 36  INR 1.3*   Assessment/Plan: Ms. Utley is a 76 y.o. lady with CKD 4-5 here with hematochezia likely secondary to diverticular bleeding. Seems to have resolved. No indication for any other evaluation. Required one unit of pRBC today but no overt GI bleeding. Recommend continued monitoring.  Recommendations:  - transfuse to keep hemoglobin > 7 - maintain active type and screen - trend daily CBC's - appreciate nephrology recommendations - baseline hemoglobin recently has been around 8 - monitor for any recurrent overt GI bleeding, if hemodynamically significant then would need to decide CTA vs tagged RBC scan given kidney issues  Please call/message with any questions or concerns.  Raylene Miyamoto MD, MPH [place]

## 2022-09-04 NOTE — Progress Notes (Signed)
[facility]  ROUNDING NOTE   Subjective:   Patient resting comfortably in bed Alert and oriented Getting ready to receive a blood transfusion today.   Objective:  Vital signs in last 24 hours:  Temp:  [98 F (36.7 C)-98.7 F (37.1 C)] 98.7 F (37.1 C) (09/14 1444) Pulse Rate:  [79-91] 82 (09/14 1444) Resp:  [16-20] 18 (09/14 1444) BP: (141-156)/(59-70) 151/69 (09/14 1444) SpO2:  [97 %-100 %] 99 % (09/14 1444) Weight:  [65.5 kg] 65.5 kg (09/14 0437)  Weight change:  Filed Weights   08/30/22 2032 09/01/22 0500 09/04/22 0437  Weight: 65.7 kg 65.3 kg 65.5 kg    Intake/Output: I/O last 3 completed shifts: In: 888.2 [P.O.:480; I.V.:408.2] Out: -    Intake/Output this shift:  Total I/O In: 320 [Blood:320] Out: -   Physical Exam: General: NAD  Head: Normocephalic, atraumatic. Moist oral mucosal membranes  Eyes: Anicteric  Lungs:  Clear to auscultation, normal effort  Heart: Regular rate and rhythm  Abdomen:  Soft, nontender  Extremities:  No peripheral edema.  Neurologic: Nonfocal, moving all four extremities  Skin: No lesions  Access: None    Basic Metabolic Panel: Recent Labs  Lab 08/31/22 0453 09/01/22 0447 09/02/22 0413 09/03/22 0411 09/04/22 0356  NA 141 140 140 141 140  K 4.5 4.3 4.7 4.5 4.6  CL 117* 117* 115* 117* 117*  CO2 19* 18* 21* 19* 19*  GLUCOSE 88 88 98 104* 100*  BUN 42* 39* 51* 59* 54*  CREATININE 3.59* 3.59* 3.62* 3.35* 3.36*  CALCIUM 8.0* 8.2* 8.2* 8.2* 7.9*     Liver Function Tests: Recent Labs  Lab 08/30/22 1600  AST 19  ALT 11  ALKPHOS 62  BILITOT 0.4  PROT 5.9*  ALBUMIN 2.8*    No results for input(s): "LIPASE", "AMYLASE" in the last 168 hours. No results for input(s): "AMMONIA" in the last 168 hours.  CBC: Recent Labs  Lab 08/30/22 1600 08/31/22 0453 08/31/22 1730 09/01/22 0447 09/01/22 1900 09/02/22 0413 09/03/22 0411 09/04/22 0356  WBC 9.3  8.7   < > 8.4 7.5  --  7.7 8.2 7.2  NEUTROABS  5.3  --   --   --   --   --   --   --   HGB 4.3*  4.5*   < > 7.1* 6.6* 7.5* 7.7* 7.1* 6.7*  HCT 14.4*  15.3*   < > 21.4* 19.9* 22.3* 23.4* 21.9* 20.6*  MCV 97.3  98.7   < > 88.1 87.7  --  86.7 89.4 89.6  PLT 233  228   < > 153 196  --  207 205 215   < > = values in this interval not displayed.     Cardiac Enzymes: No results for input(s): "CKTOTAL", "CKMB", "CKMBINDEX", "TROPONINI" in the last 168 hours.  BNP: Invalid input(s): "POCBNP"  CBG: Recent Labs  Lab 09/03/22 1132 09/03/22 1720 09/03/22 2047 09/04/22 0823 09/04/22 1157  GLUCAP 164* 111* 115* 23* 111*     Microbiology: Results for orders placed or performed during the hospital encounter of 03/08/22  Surgical pcr screen     Status: None   Collection Time: 03/08/22  8:11 PM   Specimen: Nasal Mucosa; Nasal Swab  Result Value Ref Range Status   MRSA, PCR NEGATIVE NEGATIVE Final   Staphylococcus aureus NEGATIVE NEGATIVE Final    Comment: (NOTE) The Xpert SA Assay (FDA approved for NASAL specimens in patients 4 years of age and older), is one component of  a comprehensive surveillance program. It is not intended to diagnose infection nor to guide or monitor treatment. Performed at [facility], [address], [place], [place] [address]   Urine Culture     Status: None   Collection Time: 03/08/22 10:30 PM   Specimen: PATH Other; Urine  Result Value Ref Range Status   Specimen Description   Final    CYSTOSCOPY Performed at [facility], [place] [address] [place], [place] [address]    Special Requests   Final    NONE Performed at [facility], [address] [place], [place] [address]    Culture   Final    NO GROWTH Performed at [facility], [place] [address] [place], [place] [address]    Report Status 03/10/2022 FINAL  Final    Coagulation Studies: No results for input(s): "LABPROT", "INR" in the last 72 hours.   Urinalysis: No results for input(s): "COLORURINE",  "LABSPEC", "PHURINE", "GLUCOSEU", "HGBUR", "BILIRUBINUR", "KETONESUR", "PROTEINUR", "UROBILINOGEN", "NITRITE", "LEUKOCYTESUR" in the last 72 hours.  Invalid input(s): "APPERANCEUR"    Imaging: No results found.   Medications:     sodium chloride   Intravenous Once   amLODipine  10 mg Oral Daily   ferrous sulfate  325 mg Oral Q breakfast   furosemide  20 mg Intravenous Once   sodium bicarbonate  650 mg Oral BID   acetaminophen, hydrALAZINE, ondansetron (ZOFRAN) IV  Assessment/ Plan:  [name] is a 76 y.o.  female with a PMHx of hypertension, hyperlipidemia, diabetes, peripheral vascular disease, nephrolithiasis, anemia, CKD stage V now admitted with history of hemoglobin of 4.5.  She had 3 units of blood transfusion.  Acute kidney injury on CKD stage IV:  Acute kidney injury likely due to hypovolemia.    Renal function at baseline for this patient.  No acute indication for dialysis at present.  Continues to have adequate urine output.  Continue to monitor closely.  Lab Results  Component Value Date   CREATININE 3.36 (H) 09/04/2022   CREATININE 3.35 (H) 09/03/2022   CREATININE 3.62 (H) 09/02/2022    Intake/Output Summary (Last 24 hours) at 09/04/2022 1557 Last data filed at 09/04/2022 1444 Gross per 24 hour  Intake 968.18 ml  Output --  Net 968.18 ml    2. Anemia of chronic kidney disease and blood loss Lab Results  Component Value Date   HGB 6.7 (L) 09/04/2022    Patient has received multiple units of blood during this admission and is scheduled to receive one unit today. EGD negative, colonoscopy showed diverticulitis.  - iron levels tomorrow.    3. Hypertension with chronic kidney disease. Home regimen includes amlodipine and HCTZ. Currently receiving Amlodipine.   4. Diabetes mellitus type II with chronic kidney disease/renal manifestations: noninsulin dependent. Most recent hemoglobin A1c is 7.1 on 03/11/22.  Jardiance held   LOS: [place] 9/14/20233:57 PM

## 2022-09-05 DIAGNOSIS — K5731 Diverticulosis of large intestine without perforation or abscess with bleeding: Secondary | ICD-10-CM | POA: Diagnosis not present

## 2022-09-05 DIAGNOSIS — N179 Acute kidney failure, unspecified: Secondary | ICD-10-CM | POA: Diagnosis not present

## 2022-09-05 DIAGNOSIS — D62 Acute posthemorrhagic anemia: Secondary | ICD-10-CM | POA: Diagnosis not present

## 2022-09-05 DIAGNOSIS — D631 Anemia in chronic kidney disease: Secondary | ICD-10-CM | POA: Diagnosis not present

## 2022-09-05 DIAGNOSIS — I10 Essential (primary) hypertension: Secondary | ICD-10-CM | POA: Diagnosis not present

## 2022-09-05 DIAGNOSIS — N184 Chronic kidney disease, stage 4 (severe): Secondary | ICD-10-CM | POA: Diagnosis not present

## 2022-09-05 DIAGNOSIS — E1121 Type 2 diabetes mellitus with diabetic nephropathy: Secondary | ICD-10-CM

## 2022-09-05 DIAGNOSIS — Z794 Long term (current) use of insulin: Secondary | ICD-10-CM

## 2022-09-05 LAB — BPAM RBC
Blood Product Expiration Date: 202309162359
Blood Product Expiration Date: 202309162359
Blood Product Expiration Date: 202309272359
Blood Product Expiration Date: 202309272359
Blood Product Expiration Date: 202310152359
Blood Product Expiration Date: 202309162359
ISSUE DATE / TIME: 202309091750
ISSUE DATE / TIME: 202309092123
ISSUE DATE / TIME: 202309092145
ISSUE DATE / TIME: 202309100216
ISSUE DATE / TIME: 202309111036
ISSUE DATE / TIME: 202309141134
Unit Type and Rh: 5100
Unit Type and Rh: 9500
Unit Type and Rh: 9500
Unit Type and Rh: 9500
Unit Type and Rh: 9500
Unit Type and Rh: 9500

## 2022-09-05 LAB — BASIC METABOLIC PANEL
Anion gap: 6 (ref 5–15)
BUN: 56 mg/dL — ABNORMAL HIGH (ref 8–23)
CO2: 18 mmol/L — ABNORMAL LOW (ref 22–32)
Calcium: 8.4 mg/dL — ABNORMAL LOW (ref 8.9–10.3)
Chloride: 115 mmol/L — ABNORMAL HIGH (ref 98–111)
Creatinine, Ser: 3.19 mg/dL — ABNORMAL HIGH (ref 0.44–1.00)
GFR, Estimated: 15 mL/min — ABNORMAL LOW (ref >60)
Glucose, Bld: 102 mg/dL — ABNORMAL HIGH (ref 70–99)
Potassium: 4.4 mmol/L (ref 3.5–5.1)
Sodium: 139 mmol/L (ref 135–145)

## 2022-09-05 LAB — CBC
HCT: 26.7 % — ABNORMAL LOW (ref 36.0–46.0)
Hemoglobin: 8.7 g/dL — ABNORMAL LOW (ref 12.0–15.0)
MCH: 28.7 pg (ref 26.0–34.0)
MCHC: 32.6 g/dL (ref 30.0–36.0)
MCV: 88.1 fL (ref 80.0–100.0)
Platelets: 242 10*3/uL (ref 150–400)
RBC: 3.03 MIL/uL — ABNORMAL LOW (ref 3.87–5.11)
RDW: 15.7 % — ABNORMAL HIGH (ref 11.5–15.5)
WBC: 8.2 10*3/uL (ref 4.0–10.5)
nRBC: 0 % (ref 0.0–0.2)

## 2022-09-05 LAB — TYPE AND SCREEN
ABO/RH(D): O NEG
ABO/RH(D): O NEG
Antibody Screen: NEGATIVE
Antibody Screen: NEGATIVE
Unit division: 0
Unit division: 0
Unit division: 0
Unit division: 0
Unit division: 0
Unit division: 0

## 2022-09-05 LAB — FERRITIN: Ferritin: 45 ng/mL (ref 11–307)

## 2022-09-05 LAB — GLUCOSE, CAPILLARY
Glucose-Capillary: 86 mg/dL (ref 70–99)
Glucose-Capillary: 115 mg/dL — ABNORMAL HIGH (ref 70–99)

## 2022-09-05 LAB — IRON AND TIBC
Iron: 99 ug/dL (ref 28–170)
Saturation Ratios: 37 % — ABNORMAL HIGH (ref 10.4–31.8)
TIBC: 267 ug/dL (ref 250–450)
UIBC: 168 ug/dL

## 2022-09-05 MED ORDER — LEVEMIR FLEXTOUCH 100 UNIT/ML ~~LOC~~ SOPN: 15.0000 [IU] | PEN_INJECTOR | Freq: Every day | SUBCUTANEOUS | 0 refills | Status: AC

## 2022-09-05 NOTE — Care Management Important Message (Signed)
Important Message  Patient Details  Name: Dana [name] MRN: [id number] Date of Birth: [birth year]   Medicare Important Message Given:  Yes     Loann Quill 09/05/2022, 1:24 PM

## 2022-09-05 NOTE — Progress Notes (Signed)
Mobility Specialist - Progress Note   09/05/22 0949  Mobility  Activity Ambulated independently in room  Level of Assistance Independent  Assistive Device None  Distance Ambulated (ft) 10 ft  Activity Response Tolerated well  $Mobility charge 1 Mobility   Candie [place] Mobility Specialist 09/05/22 9:50 AM

## 2022-09-05 NOTE — Discharge Instructions (Signed)
Rent/Utility/Housing  Agency Name: [facility] Address: [address] [place], [place] [address] Phone: [phone] Email: [email or web address] Website: [email or web address] Service(s) Offered: Housing services, self-sufficiency, congregate meal  program, weatherization program, heating appliance  repair/replacement program, emergency food assistance,  housing counseling, home ownership program, wheels -towork program.  Agency Name: [facility] Address: [address], [place], [place] [address] Phone: [phone] (8a-4p) [phone] (8p- 10p) Email: [email or web address] Website: [email or web address] Service(s) Offered: A program for homeless and/or needy men that includes one-on-one counseling, life skills training and job rehabilitation.  Agency Name: [facility] Address: [address], [place], [place] [address] Phone: [phone] Website: www.[email or web address].org Service(s) Offered: Assistance to needy in emergency with utility bills, heating  fuel, and prescriptions. Shelter for homeless 7pm-7am. April 16, 2017 15  Agency Name: [facility] Address: [address], [place], [place] [address] Phone: [phone] Contact Person: Wayne Dawson Email: [email or web address] Website: [email or web address] Service(s) Offered: Helps individuals with developmental disabilities move  from housing that is more restrictive to homes where they  can achieve greater independence and have more  opportunities.  Agency Name: [facility] Address: [address], [place], [place] [address] Phone: [phone] Email: [email or web address] Website: [email or web address].org Service(s) Offered: Provides affordable housing for low-income families,  elderly, and disabled individuals. Offer a wide range of  programs and services, from financial planning  to afterschool and summer programs.  Agency Name: [facility] Address: [address] [place], [place], [place] [address] Phone: [phone] Service(s) Offered: Child support services; child welfare services; food stamps;  Medicaid; work first family assistance; and aid with fuel,  rent, food and medicine.  Agency Name: [facility] Address: [facility] [place], [place]  [address] Phone: [phone] Website: www.familyabuseservices.org Service(s) Offered: 24 hour Crisis Line: 226-5985; 24 hour Emergency Shelter;  Transitional Housing; Support Groups; Court Advocacy;  Community Education; Hispanic Outreach: [phone];  Visitation Center: [phone]. April 16, 2017 16  Agency Name: [facility] Address: [address] [place], [place] [address] Phone: [phone] Service(s) Offered: CAP Services; Home and Community Supports; Individual  or Group Supports; Respite Care Non-Institutional Nursing;  Residential Supports; Respite Care and Personal Care  Services; Transportation; Family and Friends Night;  Recreational Activities; Three Nutritious Meals/Snacks;  Consultation with Registered Dietician; Twenty-four hour  Registered Nurse Access; Daily and Community Living  Skills; Camp [employer]; Summer Camp for the Special  Population (During Summer Months) Bingo Night (Every  Wednesday Night); Special Populations Dance Night  (Every Tuesday Night); Professional Hair Care Services.  Agency Name: [facility] Address: [address], [place], [place] [address] Phone: [phone] Contact Person: Gloria McCauley Website: [email or web address] Service(s) Offered: Residential treatment facility for women; food and  clothing, educational & employment development and  transportation to work; development of financial skills;  parenting and family  reunification; emotional and spiritual  support; transitional housing for program graduates.  Agency Name: [place] [facility] Address: [address], [place], [place] [address] Phone: [phone] Email: [email or web address] Website: [email or web address] Service(s) Offered: Public housing units for elderly, disabled, and low income  people; housing choice vouchers for income eligible  applicants; shelter plus care vouchers; and HOPWA  voucher program. April 16, 2017 17  Agency Name: [facility] Address: [address], ,  [address] Phone: [phone] Email: [email or web address] Website: www.habitatalamance.org Service(s) Offered: Build houses for families in need of decent housing. Each    adult in the family must invest 200 hours of labor on  someone else's house, work with volunteers to build their  own house, attend classes on budgeting, home maintenance, yard care, and attend homeowner association  meetings.  Agency Name: [facility] Address: [address], [place], [place] [address] Phone: [phone] Website: www.rsli.org Service(s) Offered: Intermediate care facilities for mentally retarded,  Supervised Living in group homes for adults with  developmental disabilities, Supervised Living for people  who have dual diagnoses (MRMI), Independent Living,  Supported Living, respite and a variety of CAP services,  pre-vocational services, day supports, and Community  Support Services.  Agency Name: [place] Phone: [phone] Website: www.NCForeclosurePrevention.gov Service(s) Offered: Zero-interest, deferred loans to homeowners struggling to  pay their mortgage. Call for more information  

## 2022-09-05 NOTE — Discharge Summary (Signed)
Physician Discharge Summary   Patient: Dana [name] MRN: [id number] DOB: Jun 24, 1946  Admit date:     08/30/2022  Discharge date: 09/05/22  Discharge Physician: Annita Brod   PCP: Gladstone Lighter, MD   Recommendations at discharge:   Medication clarification: Patient had wanted to stop taking her daily aspirin even before diverticular bleed.  At this time, we will hold on medication and have patient follow-up to discuss with her PCP. Medication change: Decrease Lantus from 30 units subcu daily to 15 units subcu daily.   Medication clarification: Patient not on any antihypertensives.  Prior to coming in, she was told by her doctor to stop her HCTZ as well as her combination Cozaar/HCTZ.  She states that her doctor told her that she would be starting a new blood pressure medicine which she has not yet.  She states that she will follow-up with him. Patient will continue to follow-up with nephrology as outpatient Follow-up with primary care physician in the next 2 weeks Patient given referral to follow-up with hematology  Discharge Diagnoses: Principal Problem:   Diverticulosis of intestine with bleeding Active Problems:   Acute blood loss anemia (ABLA)   Acute kidney injury in the setting of CKD (chronic kidney disease), stage IV (HCC)   Type II diabetes mellitus with renal manifestations (HCC)   HTN (hypertension)   Overweight (BMI 25.0-29.9)  Resolved Problems:   * No resolved hospital problems. *  Hospital Course: 76 year old female with past medical history of hypertension, diabetes mellitus, stage V chronic kidney disease who presented to the emergency room on 9/9 with rectal bleeding.  Patient was found to have a hemoglobin of 4.5 (had been 8.5 on 4/9) with some additional acute kidney injury.  Patient transfused 3 units packed red blood cells.  Gastroenterology consulted who performed an EGD which noted some erythematous duodenopathy however colonoscopy noted blood in the  entire examined colon along with extensive diverticulosis and bleeding felt to be a diverticular bleed.  Patient's hemoglobin down below 7.0 on 9/14 requiring 1 additional unit of blood.  Assessment and Plan: * Diverticulosis of intestine with bleeding GI consulted.  Status post colonoscopy noting extensive diverticulosis of blood throughout colon.  Minimal residual bleeding.  Patient's hemoglobin below 7 on 9/14 although likely she had was lower than previously thought and this was more from hemoconcentration.  Transfused additional unit of blood on 9/14.  By day of discharge, hemoglobin at 8.7.  Acute blood loss anemia (ABLA) As above.-Continue iron supplement  Acute kidney injury in the setting of CKD (chronic kidney disease), stage IV (HCC) Recent baseline creatinine 2.5-3.0.  Creatinine on admission at 3.67 Secondary to acute blood loss anemia and secondary volume depletion.  By day of discharge, creatinine down to 3.1.  Outpatient follow-up with nephrology.  Type II diabetes mellitus with renal manifestations (HCC) Recent A1c 7.1, poorly controlled.  Patient is taking Jardiance and Levemir 30 units daily.  Sliding scale discontinued after patient started having episodes of hypoglycemia.  During hospitalization, even after being placed on p.o., has not required any insulin here.  Adjusting for dietary indiscretion at home, have decreased Lantus to 15 units subcu daily with patient's given strict instructions to check her sugar several times a day (currently she is checking it several times a week) and if her CBGs are stable on 15 units to continue.  If they are starting to trend upward, slowly increase and if CBGs are low, decrease Lantus.  HTN (hypertension) Blood pressure has been trending upward,  likely due to additional volume from blood.  I have ordered some additional Lasix after blood today.  Home medications plus as needed hydralazine.   Overweight (BMI 25.0-29.9) Meets criteria  BMI greater than 25         Consultants: Gastroenterology Nephrology   Procedures: Status post colonoscopy and endoscopy Blood transfusion x2  Disposition: Home Diet recommendation:  Discharge Diet Orders (From admission, onward)     Start     Ordered   09/05/22 0000  Diet - low sodium heart healthy        09/05/22 1153           Cardiac and Carb modified diet DISCHARGE MEDICATION: Allergies as of 09/05/2022       Reactions   Clindamycin Dermatitis, Rash   Clindamycin/lincomycin Rash   Chocolate    Nosebleed   Chocolate Flavor    Nosebleed   Food Color Red    Other reaction(s): Unknown Other reaction(s): Unknown   Food Color Yellow Other (See Comments)   Other reaction(s): Unknown Other reaction(s): Unknown Other reaction(s): Unknown Other reaction(s): Unknown Other reaction(s): Unknown        Medication List     STOP taking these medications    hydrochlorothiazide 25 MG tablet Commonly known as: HYDRODIURIL   telmisartan-hydrochlorothiazide 40-12.5 MG tablet Commonly known as: MICARDIS HCT       TAKE these medications    acetaminophen 500 MG tablet Commonly known as: TYLENOL Take 500 mg by mouth every 6 (six) hours as needed for mild pain.   amLODipine 10 MG tablet Commonly known as: NORVASC TAKE 1 TABLET BY MOUTH EVERY DAY   ascorbic acid 500 MG tablet Commonly known as: VITAMIN C Take 500 mg by mouth 2 (two) times daily.   aspirin EC 81 MG tablet Take 81 mg by mouth See admin instructions. Takes on Tues and Thur only due to history of eye bleed.   B-complex with vitamin C tablet Take 1 tablet by mouth daily.   Ferrous Sulfate 142 (45 Fe) MG Tbcr Take 1 tablet by mouth every morning.   FISH OIL PO Take 1 capsule by mouth daily.   Jardiance 10 MG Tabs tablet Generic drug: empagliflozin Take 10 mg by mouth daily.   Levemir FlexTouch 100 UNIT/ML FlexPen Generic drug: insulin detemir Inject 15 Units into the skin  daily. What changed: how much to take   sodium bicarbonate 650 MG tablet Take 1 tablet (650 mg total) by mouth 2 (two) times daily.   Turmeric 500 MG Tabs Take 500 mg by mouth daily at 6 (six) AM.   VITAMIN D PO Take 2 tablets by mouth daily.   zinc gluconate 50 MG tablet Take 1 tablet by mouth daily.        Follow-up Information     Cammie Sickle, MD. Go on 09/17/2022.   Specialties: Internal Medicine, Oncology Why: 1pm appointment Contact information: [place] [address] [phone]         Gladstone Lighter, MD Follow up on 09/10/2022.   Specialty: Internal Medicine Why: 10:15am appointment Contact information: [place] [address] [phone]                Discharge Exam: Danley Danker Weights   08/30/22 2032 09/01/22 0500 09/04/22 0437  Weight: 65.7 kg 65.3 kg 65.5 kg   General: Alert and oriented x3, no acute distress Cardiovascular: Regular rate and rhythm, S1-S2 Lungs: Clear to auscultation bilaterally  Condition at  discharge: good  The results of significant diagnostics from this hospitalization (including imaging, microbiology, ancillary and laboratory) are listed below for reference.   Imaging Studies: No results found.  Microbiology: Results for orders placed or performed during the hospital encounter of 03/08/22  Surgical pcr screen     Status: None   Collection Time: 03/08/22  8:11 PM   Specimen: Nasal Mucosa; Nasal Swab  Result Value Ref Range Status   MRSA, PCR NEGATIVE NEGATIVE Final   Staphylococcus aureus NEGATIVE NEGATIVE Final    Comment: (NOTE) The Xpert SA Assay (FDA approved for NASAL specimens in patients 4 years of age and older), is one component of a comprehensive surveillance program. It is not intended to diagnose infection nor to guide or monitor treatment. Performed at [facility], [address] [place], [place] [address]   Urine Culture      Status: None   Collection Time: 03/08/22 10:30 PM   Specimen: PATH Other; Urine  Result Value Ref Range Status   Specimen Description   Final    CYSTOSCOPY Performed at [facility], [place] [address], [place], [place] [address]    Special Requests   Final    NONE Performed at [facility], [place] [place], [place] [address]    Culture   Final    NO GROWTH Performed at [facility], [place] [address] [place], [place] [address]    Report Status 03/10/2022 FINAL  Final    Labs: CBC: Recent Labs  Lab 08/30/22 1600 08/31/22 0453 09/01/22 0447 09/01/22 1900 09/02/22 0413 09/03/22 0411 09/04/22 0356 09/04/22 1627 09/05/22 0351  WBC 9.3  8.7   < > 7.5  --  7.7 8.2 7.2  --  8.2  NEUTROABS 5.3  --   --   --   --   --   --   --   --   HGB 4.3*  4.5*   < > 6.6*   < > 7.7* 7.1* 6.7* 9.2* 8.7*  HCT 14.4*  15.3*   < > 19.9*   < > 23.4* 21.9* 20.6* 28.6* 26.7*  MCV 97.3  98.7   < > 87.7  --  86.7 89.4 89.6  --  88.1  PLT 233  228   < > 196  --  207 205 215  --  242   < > = values in this interval not displayed.   Basic Metabolic Panel: Recent Labs  Lab 09/01/22 0447 09/02/22 0413 09/03/22 0411 09/04/22 0356 09/05/22 0351  NA 140 140 141 140 139  K 4.3 4.7 4.5 4.6 4.4  CL 117* 115* 117* 117* 115*  CO2 18* 21* 19* 19* 18*  GLUCOSE 88 98 104* 100* 102*  BUN 39* 51* 59* 54* 56*  CREATININE 3.59* 3.62* 3.35* 3.36* 3.19*  CALCIUM 8.2* 8.2* 8.2* 7.9* 8.4*   Liver Function Tests: Recent Labs  Lab 08/30/22 1600  AST 19  ALT 11  ALKPHOS 62  BILITOT 0.4  PROT 5.9*  ALBUMIN 2.8*   CBG: Recent Labs  Lab 09/04/22 1157 09/04/22 1703 09/04/22 1945 09/05/22 0758 09/05/22 1132  GLUCAP 111* 86 167* 86 115*    Discharge time spent: less than 30 minutes.  Signed: Annita Brod, MD [facility] 09/05/2022

## 2022-09-05 NOTE — TOC Transition Note (Signed)
Transition of Care [facility]) - CM/SW Discharge Note   Patient Details  Name: Dana [name] MRN: [id number] Date of Birth: 09/29/1946  [facility] CM/SW Contact:  Magnus Ivan, LCSW Phone Number: 09/05/2022, 12:21 PM   Clinical Narrative:    CSW spoke with patient per her request. Patient requested housing resources, states she is trying to find a rental home or apartment for her and her husband who just relocated to this area. Resources added to AVS. RN notified.    Final next level of care: Home/Self Care Barriers to Discharge: Barriers Resolved   Patient Goals and CMS Choice        Discharge Placement                    Patient and family notified of of transfer: 09/05/22  Discharge Plan and Services                                     Social Determinants of Health (SDOH) Interventions     Readmission Risk Interventions    09/02/2022    1:50 PM 03/09/2022   11:45 AM  Readmission Risk Prevention Plan  Transportation Screening Complete Complete  PCP or Specialist Appt within 3-5 Days  Complete  HRI or [place]  Complete  Social Work Consult for [place] Planning/Counseling Complete Complete  Palliative Care Screening Not Applicable Not Applicable  Medication Review Press photographer) Complete Complete

## 2022-09-05 NOTE — Progress Notes (Signed)
[facility]  ROUNDING NOTE   Subjective:   Patient resting comfortably in bed Alert and oriented     Objective:  Vital signs in last 24 hours:  Temp:  [98.1 F (36.7 C)-98.7 F (37.1 C)] 98.2 F (36.8 C) (09/15 0753) Pulse Rate:  [74-82] 74 (09/15 0753) Resp:  [16-18] 17 (09/15 0753) BP: (133-152)/(69-111) 150/72 (09/15 0753) SpO2:  [98 %-100 %] 100 % (09/15 0753)  Weight change:  Filed Weights   08/30/22 2032 09/01/22 0500 09/04/22 0437  Weight: 65.7 kg 65.3 kg 65.5 kg    Intake/Output: I/O last 3 completed shifts: In: 968.2 [P.O.:240; I.V.:408.2; Blood:320] Out: -    Intake/Output this shift:  Total I/O In: 120 [P.O.:120] Out: -   Physical Exam: General: NAD  Head: Normocephalic, atraumatic. Moist oral mucosal membranes  Eyes: Anicteric  Lungs:  Clear to auscultation, normal effort  Heart: Regular rate and rhythm  Abdomen:  Soft, nontender  Extremities:  No peripheral edema.  Neurologic: Nonfocal, moving all four extremities  Skin: No lesions  Access: None    Basic Metabolic Panel: Recent Labs  Lab 09/01/22 0447 09/02/22 0413 09/03/22 0411 09/04/22 0356 09/05/22 0351  NA 140 140 141 140 139  K 4.3 4.7 4.5 4.6 4.4  CL 117* 115* 117* 117* 115*  CO2 18* 21* 19* 19* 18*  GLUCOSE 88 98 104* 100* 102*  BUN 39* 51* 59* 54* 56*  CREATININE 3.59* 3.62* 3.35* 3.36* 3.19*  CALCIUM 8.2* 8.2* 8.2* 7.9* 8.4*     Liver Function Tests: Recent Labs  Lab 08/30/22 1600  AST 19  ALT 11  ALKPHOS 62  BILITOT 0.4  PROT 5.9*  ALBUMIN 2.8*    No results for input(s): "LIPASE", "AMYLASE" in the last 168 hours. No results for input(s): "AMMONIA" in the last 168 hours.  CBC: Recent Labs  Lab 08/30/22 1600 08/31/22 0453 09/01/22 0447 09/01/22 1900 09/02/22 0413 09/03/22 0411 09/04/22 0356 09/04/22 1627 09/05/22 0351  WBC 9.3  8.7   < > 7.5  --  7.7 8.2 7.2  --  8.2  NEUTROABS 5.3  --   --   --   --   --   --   --   --   HGB 4.3*   4.5*   < > 6.6*   < > 7.7* 7.1* 6.7* 9.2* 8.7*  HCT 14.4*  15.3*   < > 19.9*   < > 23.4* 21.9* 20.6* 28.6* 26.7*  MCV 97.3  98.7   < > 87.7  --  86.7 89.4 89.6  --  88.1  PLT 233  228   < > 196  --  207 205 215  --  242   < > = values in this interval not displayed.     Cardiac Enzymes: No results for input(s): "CKTOTAL", "CKMB", "CKMBINDEX", "TROPONINI" in the last 168 hours.  BNP: Invalid input(s): "POCBNP"  CBG: Recent Labs  Lab 09/04/22 1157 09/04/22 1703 09/04/22 1945 09/05/22 0758 09/05/22 1132  GLUCAP 111* 86 167* 60 115*     Microbiology: Results for orders placed or performed during the hospital encounter of 03/08/22  Surgical pcr screen     Status: None   Collection Time: 03/08/22  8:11 PM   Specimen: Nasal Mucosa; Nasal Swab  Result Value Ref Range Status   MRSA, PCR NEGATIVE NEGATIVE Final   Staphylococcus aureus NEGATIVE NEGATIVE Final    Comment: (NOTE) The Xpert SA Assay (FDA approved for NASAL specimens in patients 22  years of age and older), is one component of a comprehensive surveillance program. It is not intended to diagnose infection nor to guide or monitor treatment. Performed at [facility], [address] [place], [place] [address]   Urine Culture     Status: None   Collection Time: 03/08/22 10:30 PM   Specimen: PATH Other; Urine  Result Value Ref Range Status   Specimen Description   Final    CYSTOSCOPY Performed at [facility], [place] [address], [place], [place] [address]    Special Requests   Final    NONE Performed at [facility], [place] [place], [place] [address]    Culture   Final    NO GROWTH Performed at [facility], [place] [address], [place], [place] [address]    Report Status 03/10/2022 FINAL  Final    Coagulation Studies: No results for input(s): "LABPROT", "INR" in the last 72 hours.   Urinalysis: No results for input(s): "COLORURINE", "LABSPEC", "PHURINE", "GLUCOSEU",  "HGBUR", "BILIRUBINUR", "KETONESUR", "PROTEINUR", "UROBILINOGEN", "NITRITE", "LEUKOCYTESUR" in the last 72 hours.  Invalid input(s): "APPERANCEUR"    Imaging: No results found.   Medications:     sodium chloride   Intravenous Once   amLODipine  10 mg Oral Daily   ferrous sulfate  325 mg Oral Q breakfast   furosemide  60 mg Intravenous Once   sodium bicarbonate  650 mg Oral BID   acetaminophen, hydrALAZINE, ondansetron (ZOFRAN) IV  Assessment/ Plan:  [name] is a 76 y.o.  female with a PMHx of hypertension, hyperlipidemia, diabetes, peripheral vascular disease, nephrolithiasis, anemia, CKD stage V now admitted with history of hemoglobin of 4.5.  She had 3 units of blood transfusion.  Acute kidney injury on CKD stage IV:  Acute kidney injury likely due to hypovolemia.    Renal function at baseline for this patient.  No acute indication for dialysis at present.  Continues to have adequate urine output.  Continue to monitor closely.  Lab Results  Component Value Date   CREATININE 3.19 (H) 09/05/2022   CREATININE 3.36 (H) 09/04/2022   CREATININE 3.35 (H) 09/03/2022    Intake/Output Summary (Last 24 hours) at 09/05/2022 1355 Last data filed at 09/05/2022 1034 Gross per 24 hour  Intake 440 ml  Output --  Net 440 ml    2. Anemia of chronic kidney disease and blood loss Lab Results  Component Value Date   HGB 8.7 (L) 09/05/2022    Patient has received multiple units of blood during this admission. EGD negative, colonoscopy showed diverticulitis.  - iron levels adequate.  3. Hypertension with chronic kidney disease. Home regimen includes amlodipine and HCTZ. Currently receiving Amlodipine.   4. Diabetes mellitus type II with chronic kidney disease/renal manifestations: noninsulin dependent. Most recent hemoglobin A1c is 7.1 on 03/11/22.  Jardiance held   LOS: [place] 9/15/20231:55 PM

## 2022-09-05 NOTE — Consult Note (Signed)
   [facility] CM Inpatient Consult   09/05/2022  Fernande Treiber 07/05/1946 [id number]  [facility] [ACO] Patient: [facility] Liaison coverage for [facility] remote review and call  Primary Care Provider: Gladstone Lighter, MD, [place]   Patient screened for hospitalization  to assess for potential [place] [facility] service needs for post hospital transition.  Review of patient's medical record of [facility] team notes reveals patient is currently seeking better housing.  Attempts to reach patient via phone was not successful due to restricted usage per automated message. Attempt to text patient with general HIPAA appropriate information.  Plan:  Await a return call or text for readmission prevention measures.    For questions contact:   Natividad Brood, RN BSN [facility] Liaison  [phone] business mobile phone Toll free office [phone]  Fax number: [phone] [email or web address]

## 2022-09-11 ENCOUNTER — Telehealth: Payer: Self-pay

## 2022-09-11 NOTE — Chronic Care Management (AMB) (Signed)
  Care Coordination  Outreach Note  09/11/2022 Name: Gerica Koble MRN: [id number] DOB: 1946/02/10   Care Coordination Outreach Attempts: An unsuccessful telephone outreach was attempted today to offer the patient information about available care coordination services as a benefit of their health plan.   Follow Up Plan:  Additional outreach attempts will be made to offer the patient care coordination information and services.   Encounter Outcome:  No Answer  Sig Noreene Larsson, [place], [place] [address] Direct Dial: [phone] [email or web address]

## 2022-09-15 NOTE — Chronic Care Management (AMB) (Signed)
  Care Coordination  Outreach Note  09/15/2022 Name: Dana [name] MRN: [id number] DOB: [birth year]   Care Coordination Outreach Attempts: An unsuccessful telephone outreach was attempted today to offer the patient information about available care coordination services as a benefit of their health plan.   Follow Up Plan:  Additional outreach attempts will be made to offer the patient care coordination information and services.   Encounter Outcome:  No Answer  Sig Noreene Larsson, [place], [place] [address] Direct Dial: [phone] [email or web address]

## 2022-09-17 ENCOUNTER — Inpatient Hospital Stay: Payer: No Typology Code available for payment source

## 2022-09-17 ENCOUNTER — Inpatient Hospital Stay: Payer: No Typology Code available for payment source | Attending: Internal Medicine | Admitting: Internal Medicine

## 2022-09-17 ENCOUNTER — Encounter: Payer: Self-pay | Admitting: Internal Medicine

## 2022-09-17 DIAGNOSIS — E1022 Type 1 diabetes mellitus with diabetic chronic kidney disease: Secondary | ICD-10-CM | POA: Insufficient documentation

## 2022-09-17 DIAGNOSIS — Z79899 Other long term (current) drug therapy: Secondary | ICD-10-CM | POA: Diagnosis not present

## 2022-09-17 DIAGNOSIS — D649 Anemia, unspecified: Secondary | ICD-10-CM

## 2022-09-17 DIAGNOSIS — N184 Chronic kidney disease, stage 4 (severe): Secondary | ICD-10-CM | POA: Diagnosis not present

## 2022-09-17 DIAGNOSIS — D631 Anemia in chronic kidney disease: Secondary | ICD-10-CM | POA: Diagnosis not present

## 2022-09-17 DIAGNOSIS — I10 Essential (primary) hypertension: Secondary | ICD-10-CM | POA: Diagnosis not present

## 2022-09-17 DIAGNOSIS — I129 Hypertensive chronic kidney disease with stage 1 through stage 4 chronic kidney disease, or unspecified chronic kidney disease: Secondary | ICD-10-CM | POA: Insufficient documentation

## 2022-09-17 DIAGNOSIS — Z794 Long term (current) use of insulin: Secondary | ICD-10-CM | POA: Diagnosis not present

## 2022-09-17 LAB — CBC WITH DIFFERENTIAL/PLATELET
Abs Immature Granulocytes: 0.01 10*3/uL (ref 0.00–0.07)
Basophils Absolute: 0 10*3/uL (ref 0.0–0.1)
Basophils Relative: 0 %
Eosinophils Absolute: 0.2 10*3/uL (ref 0.0–0.5)
Eosinophils Relative: 3 %
HCT: 27.9 % — ABNORMAL LOW (ref 36.0–46.0)
Hemoglobin: 9.1 g/dL — ABNORMAL LOW (ref 12.0–15.0)
Immature Granulocytes: 0 %
Lymphocytes Relative: 25 %
Lymphs Abs: 1.5 10*3/uL (ref 0.7–4.0)
MCH: 29.8 pg (ref 26.0–34.0)
MCHC: 32.6 g/dL (ref 30.0–36.0)
MCV: 91.5 fL (ref 80.0–100.0)
Monocytes Absolute: 0.4 10*3/uL (ref 0.1–1.0)
Monocytes Relative: 6 %
Neutro Abs: 4 10*3/uL (ref 1.7–7.7)
Neutrophils Relative %: 66 %
Platelets: 243 10*3/uL (ref 150–400)
RBC: 3.05 MIL/uL — ABNORMAL LOW (ref 3.87–5.11)
RDW: 14.7 % (ref 11.5–15.5)
WBC: 6 10*3/uL (ref 4.0–10.5)
nRBC: 0 % (ref 0.0–0.2)

## 2022-09-17 LAB — IRON AND TIBC
Iron: 72 ug/dL (ref 28–170)
Saturation Ratios: 27 % (ref 10.4–31.8)
TIBC: 269 ug/dL (ref 250–450)
UIBC: 197 ug/dL

## 2022-09-17 LAB — BASIC METABOLIC PANEL
Anion gap: 5 (ref 5–15)
BUN: 52 mg/dL — ABNORMAL HIGH (ref 8–23)
CO2: 20 mmol/L — ABNORMAL LOW (ref 22–32)
Calcium: 8.3 mg/dL — ABNORMAL LOW (ref 8.9–10.3)
Chloride: 115 mmol/L — ABNORMAL HIGH (ref 98–111)
Creatinine, Ser: 3.55 mg/dL — ABNORMAL HIGH (ref 0.44–1.00)
GFR, Estimated: 13 mL/min — ABNORMAL LOW (ref >60)
Glucose, Bld: 145 mg/dL — ABNORMAL HIGH (ref 70–99)
Potassium: 4.6 mmol/L (ref 3.5–5.1)
Sodium: 140 mmol/L (ref 135–145)

## 2022-09-17 LAB — FERRITIN: Ferritin: 65 ng/mL (ref 11–307)

## 2022-09-17 MED ORDER — SODIUM CHLORIDE 0.9 % IV SOLN
200.0000 mg | Freq: Once | INTRAVENOUS | Status: DC
  Filled 2022-09-17: qty 10

## 2022-09-17 MED ORDER — SODIUM CHLORIDE 0.9 % IV SOLN
Freq: Once | INTRAVENOUS | Status: DC
  Filled 2022-09-17: qty 250

## 2022-09-17 MED ORDER — AMLODIPINE BESYLATE 10 MG PO TABS: 10.0000 mg | ORAL_TABLET | Freq: Every day | ORAL | 0 refills | Status: AC

## 2022-09-17 NOTE — Assessment & Plan Note (Addendum)
#  Acute on chronic anemia-worsened by recent GI bleed [sep 2023]-  June 2023 hemoglobin 8.1; iron saturation 15% ferritin 35.  However in September 2023-as per lower GI bleed-hemoglobin 4.5.  Status post 4 units of PRBC transfusion.  His hemoglobin today is 9.5.  Proceed with Venofer.  #Etiology of anemia: Likely secondary to chronic kidney disease/lower GI bleed [sep 2023];JUNE 2023- kappa lambda light chain ratio slightly abnormal however expected of CKD.  M protein- negative.   # DM- on Insulin [Dr.kalisetti]- STABLE.   # Elevated BP- ran out of BP medications; will refill norvasc while awaiting PCP appt.   #Chronic kidney disease stage IV-V GFR 13-[nephrology] Dr. Candiss Norse- STABLE  # DISPOSITION: # proceed with venofer today # venofer weekly x2  # Follow up in 2 months- MD; labs- cbc/bmp;- possible venofer OR retacrit---Dr.B  Addendum: Unable to proceed with Venofer because of IV access issues.  Will reschedule for chemo infusions prior to next visit in 2 months.  Cc: Dr.Kalisetti /Dr.Singh.

## 2022-09-17 NOTE — Progress Notes (Unsigned)
Patient had recent hospital admission with GI bleed requiring blood transfusions.  Unable to take oral iron daily due to constipation.

## 2022-09-17 NOTE — Progress Notes (Signed)
[facility] NOTE  Patient Care Team: Gladstone Lighter, MD as PCP - General (Internal Medicine) Cammie Sickle, MD as Consulting Physician (Oncology)  CHIEF COMPLAINTS/PURPOSE OF CONSULTATION: ANEMIA   HEMATOLOGY HISTORY  # ANEMIA[Hb;8  MCV-95 platelets- WBC; Iron sat: 25% ferritin- 34;  GFR-18 CT/US; EGD- 4 years; PA /colonoscopy-[last 5 years; PA]- JUNE  2023- iron sat- 15%; ferritin-35; MM panel-NEG; K/L= 1.89; SEP 2023-lower GI bleed- EGD which noted some erythematous duodenopathy however colonoscopy noted blood in the entire examined colon along with extensive diverticulosis and bleeding felt to be a diverticular bleed  # CKD-IV [Dr.Kolluru/Dr.Singh]; March 2023-kidney stone hydronephrosis s/p stenting [Dr.Stoioff]/hospital-   HISTORY OF PRESENTING ILLNESS: Alone.  Ambulating independently.  Dana [name] 76 y.o.  female pleasant patient with multiple medical problems including chronic kidney disease -is here for for follow-up.  In the interim patient was admitted to hospital-in September for lower GI bleed. Patient was found to have a hemoglobin of 4.5 (had been 8.5 on 4/9) with some additional acute kidney injury.  Patient transfused 4 units packed red blood cells. Gastroenterology consulted who performed an EGD which noted some erythematous duodenopathy however colonoscopy noted blood in the entire examined colon along with extensive diverticulosis and bleeding felt to be a diverticular bleed.   Patient currently feels improved since discharge.  Denies any abdominal pain.  Denies any nausea vomiting.  Denies any more bleeding.   Review of Systems  Constitutional:  Positive for malaise/fatigue. Negative for chills, diaphoresis, fever and weight loss.  HENT:  Negative for nosebleeds and sore throat.   Eyes:  Negative for double vision.  Respiratory:  Negative for cough, hemoptysis, sputum production, shortness of breath and wheezing.    Cardiovascular:  Negative for chest pain, palpitations, orthopnea and leg swelling.  Gastrointestinal:  Negative for abdominal pain, blood in stool, constipation, diarrhea, heartburn, melena, nausea and vomiting.  Genitourinary:  Negative for dysuria, frequency and urgency.  Musculoskeletal:  Negative for back pain and joint pain.  Skin: Negative.  Negative for itching and rash.  Neurological:  Negative for dizziness, tingling, focal weakness, weakness and headaches.  Endo/Heme/Allergies:  Does not bruise/bleed easily.  Psychiatric/Behavioral:  Negative for depression. The patient is not nervous/anxious and does not have insomnia.      MEDICAL HISTORY:  Past Medical History:  Diagnosis Date   Anemia    Arthritis    Chronic kidney disease    DM (diabetes mellitus), type 1 (HCC)    History of kidney stones    Hypertension    Peripheral vascular disease ([place])     SURGICAL HISTORY: Past Surgical History:  Procedure Laterality Date   ABDOMINAL HYSTERECTOMY     COLONOSCOPY     COLONOSCOPY N/A 08/31/2022   Procedure: COLONOSCOPY;  Surgeon: Lesly Rubenstein, MD;  Location: [facility] ENDOSCOPY;  Service: Endoscopy;  Laterality: N/A;   CYSTOSCOPY W/ RETROGRADES Right 04/08/2022   Procedure: CYSTOSCOPY WITH RETROGRADE PYELOGRAM;  Surgeon: Abbie Sons, MD;  Location: [facility] ORS;  Service: Urology;  Laterality: Right;   CYSTOSCOPY WITH STENT PLACEMENT Right 03/08/2022   Procedure: CYSTOSCOPY WITH RIGHT URETERALSTENT PLACEMENT;  Surgeon: Abbie Sons, MD;  Location: [facility] ORS;  Service: Urology;  Laterality: Right;   CYSTOSCOPY/URETEROSCOPY/HOLMIUM LASER/STENT PLACEMENT Right 04/08/2022   Procedure: CYSTOSCOPY/URETEROSCOPY/HOLMIUM LASER/STENT PLACEMENT;  Surgeon: Abbie Sons, MD;  Location: [facility] ORS;  Service: Urology;  Laterality: Right;   ESOPHAGOGASTRODUODENOSCOPY N/A 08/31/2022   Procedure: ESOPHAGOGASTRODUODENOSCOPY (EGD);  Surgeon: Lesly Rubenstein, MD;  Location[facility]  ENDOSCOPY;  Service: Endoscopy;  Laterality: N/A;   EYE SURGERY Left 11/28/2021   HYSTERECTOMY ABDOMINAL WITH SALPINGO-OOPHORECTOMY     URETEROSCOPY Right 03/08/2022   Procedure: URETEROSCOPY;  Surgeon: Abbie Sons, MD;  Location: [facility] ORS;  Service: Urology;  Laterality: Right;    SOCIAL HISTORY: Social History   Socioeconomic History   Marital status: Married    Spouse name: Toula Moos   Number of children: Not on file   Years of education: Not on file   Highest education level: Not on file  Occupational History   Not on file  Tobacco Use   Smoking status: Never   Smokeless tobacco: Never  Vaping Use   Vaping Use: Never used  Substance and Sexual Activity   Alcohol use: Never   Drug use: Never   Sexual activity: Yes  Other Topics Concern   Not on file  Social History Narrative   From [place]; in 73s to [place]. 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    Social Determinants of Health   Financial Resource Strain: Not on file  Food Insecurity: No Food Insecurity (09/02/2022)   Hunger Vital Sign    Worried About Running Out of Food in the Last Year: Never true    Ran Out of Food in the Last Year: Never true  Transportation Needs: No Transportation Needs (09/02/2022)   PRAPARE - Hydrologist (Medical): No    Lack of Transportation (Non-Medical): No  Physical Activity: Not on file  Stress: Not on file  Social Connections: Not on file  Intimate Partner Violence: Not At Risk (09/02/2022)   Humiliation, Afraid, Rape, and Kick questionnaire    Fear of Current or Ex-Partner: No    Emotionally Abused: No    Physically Abused: No    Sexually Abused: No    FAMILY HISTORY: Family History  Problem Relation Age of Onset   Diabetes Mother    Hypertension Father    Diabetes Sister    Diabetes Sister    Leukemia Sister    Hypertension Sister    Diabetes Brother    Colon cancer Brother    Diabetes Brother    Diabetes Brother      ALLERGIES:  is allergic to clindamycin, clindamycin/lincomycin, chocolate, chocolate flavor, food color red, and food color yellow.  MEDICATIONS:  Current Outpatient Medications  Medication Sig Dispense Refill   acetaminophen (TYLENOL) 500 MG tablet Take 500 mg by mouth every 6 (six) hours as needed for mild pain.     aspirin 81 MG EC tablet Take 81 mg by mouth See admin instructions. Takes on Tues and Thur only due to history of eye bleed.     B Complex-C (B-COMPLEX WITH VITAMIN C) tablet Take 1 tablet by mouth daily.     Ferrous Sulfate 142 (45 Fe) MG TBCR Take 1 tablet by mouth every morning.     insulin detemir (LEVEMIR FLEXTOUCH) 100 UNIT/ML FlexPen Inject 15 Units into the skin daily. 15 mL 0   JARDIANCE 10 MG TABS tablet Take 10 mg by mouth daily.     Omega-3 Fatty Acids (FISH OIL PO) Take 1 capsule by mouth daily.     sodium bicarbonate 650 MG tablet Take 1 tablet (650 mg total) by mouth 2 (two) times daily. 60 tablet 0   Turmeric 500 MG TABS Take 500 mg by mouth daily at 6 (six) AM.     vitamin C (ASCORBIC ACID) 500 MG tablet Take 500 mg by  mouth 2 (two) times daily.     VITAMIN D PO Take 2 tablets by mouth daily.     zinc gluconate 50 MG tablet Take 1 tablet by mouth daily.     amLODipine (NORVASC) 10 MG tablet Take 1 tablet (10 mg total) by mouth daily. 90 tablet 0   No current facility-administered medications for this visit.     Marland Kitchen  PHYSICAL EXAMINATION:   Vitals:   09/17/22 1300  BP: (!) 173/79  Pulse: 73  Resp: 16  Temp: (!) 96.5 F (35.8 C)   Filed Weights   09/17/22 1300  Weight: 142 lb 9.6 oz (64.7 kg)    Physical Exam Vitals and nursing note reviewed.  HENT:     Head: Normocephalic and atraumatic.     Mouth/Throat:     Pharynx: Oropharynx is clear.  Eyes:     Extraocular Movements: Extraocular movements intact.     Pupils: Pupils are equal, round, and reactive to light.  Cardiovascular:     Rate and Rhythm: Normal rate and regular rhythm.   Pulmonary:     Comments: Decreased breath sounds bilaterally.  Abdominal:     Palpations: Abdomen is soft.  Musculoskeletal:        General: Normal range of motion.     Cervical back: Normal range of motion.  Skin:    General: Skin is warm.  Neurological:     General: No focal deficit present.     Mental Status: She is alert and oriented to person, place, and time.  Psychiatric:        Behavior: Behavior normal.        Judgment: Judgment normal.      LABORATORY DATA:  I have reviewed the data as listed Lab Results  Component Value Date   WBC 6.0 09/17/2022   HGB 9.1 (L) 09/17/2022   HCT 27.9 (L) 09/17/2022   MCV 91.5 09/17/2022   PLT 243 09/17/2022   Recent Labs    03/14/22 0330 03/15/22 0454 03/30/22 1540 05/23/22 1223 08/30/22 1600 08/31/22 0453 09/04/22 0356 09/05/22 0351 09/17/22 1231  NA 141   < > 138 136 137   < > 140 139 140  K 4.2   < > 3.9 4.2 4.9   < > 4.6 4.4 4.6  CL 116*   < > 106 105 114*   < > 117* 115* 115*  CO2 22   < > 25 23 17*   < > 19* 18* 20*  GLUCOSE 124*   < > 138* 140* 131*   < > 100* 102* 145*  BUN 39*   < > 37* 49* 46*   < > 54* 56* 52*  CREATININE 2.73*   < > 2.66* 3.07* 3.67*   < > 3.36* 3.19* 3.55*  CALCIUM 8.2*   < > 8.5* 8.2* 8.1*   < > 7.9* 8.4* 8.3*  GFRNONAA 17*   < > 18* 15* 12*   < > 14* 15* 13*  PROT 6.1*  --  7.3 7.4 5.9*  --   --   --   --   ALBUMIN 2.0*  --  2.9* 3.1* 2.8*  --   --   --   --   AST 28  --  14* 25 19  --   --   --   --   ALT 23  --  11 17 11   --   --   --   --   ALKPHOS 96  --  82 100 62  --   --   --   --   BILITOT 0.3  --  0.5 0.1* 0.4  --   --   --   --   BILIDIR <0.1  --   --   --   --   --   --   --   --   IBILI NOT CALCULATED  --   --   --   --   --   --   --   --    < > = values in this interval not displayed.     No results found.  ASSESSMENT & PLAN:   Symptomatic anemia #Acute on chronic anemia-worsened by recent GI bleed [sep 2023]-  June 2023 hemoglobin 8.1; iron saturation 15%  ferritin 35.  However in September 2023-as per lower GI bleed-hemoglobin 4.5.  Status post 4 units of PRBC transfusion.  His hemoglobin today is 9.5.  Proceed with Venofer.  #Etiology of anemia: Likely secondary to chronic kidney disease/lower GI bleed [sep 2023];JUNE 2023- kappa lambda light chain ratio slightly abnormal however expected of CKD.  M protein- negative.   # DM- on Insulin [Dr.kalisetti]- STABLE.   # Elevated BP- ran out of BP medications; will refill norvasc while awaiting PCP appt.   #Chronic kidney disease stage IV GFR 13-[nephrology] Dr. Candiss Norse- STABLE  # DISPOSITION: # proceed with venofer today # venofer weekly x2  # Follow up in 2 months- MD; labs- cbc/bmp;- possible venofer OR retacrit---Dr.B  Addendum: Unable to proceed with Venofer because of IV access issues.  Will reschedule for chemo infusions prior to next visit in 2 months.  Cc: Dr.Kalisetti /Dr.Singh.    All questions were answered. The patient knows to call the clinic with any problems, questions or concerns.    Cammie Sickle, MD 09/18/2022 8:21 AM

## 2022-09-17 NOTE — Progress Notes (Signed)
Unable to obtain IV access. Stuck twice, pt did not want to be stuck again. Will return in one week for scheduled appt for venofer. Encouraged pt to drink water prior to coming. Pt verbalized understanding

## 2022-09-18 ENCOUNTER — Encounter: Payer: Self-pay | Admitting: Internal Medicine

## 2022-09-22 NOTE — Chronic Care Management (AMB) (Signed)
  Care Coordination  Outreach Note  09/22/2022 Name: Dana [name] MRN: [id number] DOB: [birth year]   Care Coordination Outreach Attempts: A third unsuccessful outreach was attempted today to offer the patient with information about available care coordination services as a benefit of their health plan.   Follow Up Plan:  No further outreach attempts will be made at this time. We have been unable to contact the patient to offer or enroll patient in care coordination services  Encounter Outcome:  No Answer  [place], [place], [place] [address] Direct Dial: [phone] [email or web address]

## 2022-09-23 MED FILL — Iron Sucrose Inj 20 MG/ML (Fe Equiv): INTRAVENOUS | Qty: 10 | Status: AC

## 2022-09-24 ENCOUNTER — Inpatient Hospital Stay: Payer: No Typology Code available for payment source | Attending: Internal Medicine

## 2022-09-30 MED FILL — Iron Sucrose Inj 20 MG/ML (Fe Equiv): INTRAVENOUS | Qty: 10 | Status: AC

## 2022-10-01 ENCOUNTER — Inpatient Hospital Stay: Payer: No Typology Code available for payment source

## 2022-10-07 MED FILL — Iron Sucrose Inj 20 MG/ML (Fe Equiv): INTRAVENOUS | Qty: 10 | Status: AC

## 2022-10-08 ENCOUNTER — Inpatient Hospital Stay: Payer: No Typology Code available for payment source

## 2022-10-20 ENCOUNTER — Other Ambulatory Visit: Payer: Self-pay | Admitting: *Deleted

## 2022-10-20 DIAGNOSIS — N201 Calculus of ureter: Secondary | ICD-10-CM

## 2022-10-22 ENCOUNTER — Encounter: Payer: Self-pay | Admitting: Urology

## 2022-10-22 ENCOUNTER — Ambulatory Visit: Payer: No Typology Code available for payment source | Admitting: Urology

## 2022-11-12 ENCOUNTER — Ambulatory Visit: Payer: No Typology Code available for payment source | Admitting: Urology

## 2022-11-12 MED FILL — Iron Sucrose Inj 20 MG/ML (Fe Equiv): INTRAVENOUS | Qty: 10 | Status: AC

## 2022-11-17 ENCOUNTER — Inpatient Hospital Stay: Payer: No Typology Code available for payment source

## 2022-11-17 ENCOUNTER — Inpatient Hospital Stay: Payer: No Typology Code available for payment source | Admitting: Internal Medicine

## 2022-11-17 ENCOUNTER — Inpatient Hospital Stay: Payer: No Typology Code available for payment source | Attending: Internal Medicine

## 2022-11-17 NOTE — Assessment & Plan Note (Deleted)
#  Acute on chronic anemia-worsened by recent GI bleed [sep 2023]-  June 2023 hemoglobin 8.1; iron saturation 15% ferritin 35.  However in September 2023-as per lower GI bleed-hemoglobin 4.5.  Status post 4 units of PRBC transfusion.  His hemoglobin today is 9.5.  Proceed with Venofer.  #Etiology of anemia: Likely secondary to chronic kidney disease/lower GI bleed [sep 2023];JUNE 2023- kappa lambda light chain ratio slightly abnormal however expected of CKD.  M protein- negative.   # DM- on Insulin [Dr.kalisetti]- STABLE.   # Elevated BP- ran out of BP medications; will refill norvasc while awaiting PCP appt.   #Chronic kidney disease stage IV-V GFR 13-[nephrology] Dr. Candiss Norse- STABLE  # IV access: Unable to obtain IV access. Stuck twice, pt did not want to be stuck again. Will return in one week for scheduled appt for venofer. Encouraged pt to drink water prior to coming. Pt verbalized understanding  # DISPOSITION: # proceed with venofer today # venofer weekly x2  # Follow up in 2 months- MD; labs- cbc/bmp;- possible venofer OR retacrit---Dr.B  Addendum: Unable to proceed with Venofer because of IV access issues.    Cc: Dr.Kalisetti /Dr.Singh.

## 2022-11-17 NOTE — Progress Notes (Deleted)
Dana [name] NOTE  Patient Care Team: Gladstone Lighter, MD as PCP - General (Internal Medicine) Cammie Sickle, MD as Consulting Physician (Oncology)  CHIEF COMPLAINTS/PURPOSE OF CONSULTATION: ANEMIA   HEMATOLOGY HISTORY  # ANEMIA[Hb;8  MCV-95 platelets- WBC; Iron sat: 25% ferritin- 34;  GFR-18 CT/US; EGD- 4 years; PA /colonoscopy-[last 5 years; PA]- JUNE  2023- iron sat- 15%; ferritin-35; MM panel-NEG; K/L= 1.89; SEP 2023-Hb- 4.8 [s/p 4 units PRBC]lower GI bleed- EGD which noted some erythematous duodenopathy however colonoscopy noted blood in the entire examined colon along with extensive diverticulosis and bleeding felt to be a diverticular bleed  # CKD-IV [Dr.Kolluru/Dr.Singh]; March 2023-kidney stone hydronephrosis s/p stenting [Dr.Stoioff]/hospital-   HISTORY OF PRESENTING ILLNESS: Alone.  Ambulating independently.  Dana [name] 76 y.o.  female pleasant patient with multiple medical problems including history of lower GI bleed[likely diverticular SEP 2023-]chronic kidney disease -is here for for follow-up.  Patient was recommended IV iron infusion however unable to get 2 months ago because of poor IV access...  Patient currently feels improved since discharge.  Denies any abdominal pain.  Denies any nausea vomiting.  Denies any more bleeding.   Review of Systems  Constitutional:  Positive for malaise/fatigue. Negative for chills, diaphoresis, fever and weight loss.  HENT:  Negative for nosebleeds and sore throat.   Eyes:  Negative for double vision.  Respiratory:  Negative for cough, hemoptysis, sputum production, shortness of breath and wheezing.   Cardiovascular:  Negative for chest pain, palpitations, orthopnea and leg swelling.  Gastrointestinal:  Negative for abdominal pain, blood in stool, constipation, diarrhea, heartburn, melena, nausea and vomiting.  Genitourinary:  Negative for dysuria, frequency and urgency.  Musculoskeletal:  Negative  for back pain and joint pain.  Skin: Negative.  Negative for itching and rash.  Neurological:  Negative for dizziness, tingling, focal weakness, weakness and headaches.  Endo/Heme/Allergies:  Does not bruise/bleed easily.  Psychiatric/Behavioral:  Negative for depression. The patient is not nervous/anxious and does not have insomnia.      MEDICAL HISTORY:  Past Medical History:  Diagnosis Date   Anemia    Arthritis    Chronic kidney disease    DM (diabetes mellitus), type 1 (HCC)    History of kidney stones    Hypertension    Peripheral vascular disease ([place])     SURGICAL HISTORY: Past Surgical History:  Procedure Laterality Date   ABDOMINAL HYSTERECTOMY     COLONOSCOPY     COLONOSCOPY N/A 08/31/2022   Procedure: COLONOSCOPY;  Surgeon: Lesly Rubenstein, MD;  Location: [facility] ENDOSCOPY;  Service: Endoscopy;  Laterality: N/A;   CYSTOSCOPY W/ RETROGRADES Right 04/08/2022   Procedure: CYSTOSCOPY WITH RETROGRADE PYELOGRAM;  Surgeon: Abbie Sons, MD;  Location: [facility] ORS;  Service: Urology;  Laterality: Right;   CYSTOSCOPY WITH STENT PLACEMENT Right 03/08/2022   Procedure: CYSTOSCOPY WITH RIGHT URETERALSTENT PLACEMENT;  Surgeon: Abbie Sons, MD;  Location: [facility] ORS;  Service: Urology;  Laterality: Right;   CYSTOSCOPY/URETEROSCOPY/HOLMIUM LASER/STENT PLACEMENT Right 04/08/2022   Procedure: CYSTOSCOPY/URETEROSCOPY/HOLMIUM LASER/STENT PLACEMENT;  Surgeon: Abbie Sons, MD;  Location: [facility] ORS;  Service: Urology;  Laterality: Right;   ESOPHAGOGASTRODUODENOSCOPY N/A 08/31/2022   Procedure: ESOPHAGOGASTRODUODENOSCOPY (EGD);  Surgeon: Lesly Rubenstein, MD;  [facility]COPY;  Service: Endoscopy;  Laterality: N/A;   EYE SURGERY Left 11/28/2021   HYSTERECTOMY ABDOMINAL WITH SALPINGO-OOPHORECTOMY     URETEROSCOPY Right 03/08/2022   Procedure: URETEROSCOPY;  Surgeon: Abbie Sons, MD;  Location: [facility] ORS;  Service: Urology;  Laterality:  Right;    SOCIAL  HISTORY: Social History   Socioeconomic History   Marital status: Married    Spouse name: Toula Moos   Number of children: Not on file   Years of education: Not on file   Highest education level: Not on file  Occupational History   Not on file  Tobacco Use   Smoking status: Never   Smokeless tobacco: Never  Vaping Use   Vaping Use: Never used  Substance and Sexual Activity   Alcohol use: Never   Drug use: Never   Sexual activity: Yes  Other Topics Concern   Not on file  Social History Narrative   From [place]; in 15s to [place]. 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    Social Determinants of Health   Financial Resource Strain: Not on file  Food Insecurity: No Food Insecurity (09/02/2022)   Hunger Vital Sign    Worried About Running Out of Food in the Last Year: Never true    Ran Out of Food in the Last Year: Never true  Transportation Needs: No Transportation Needs (09/02/2022)   PRAPARE - Hydrologist (Medical): No    Lack of Transportation (Non-Medical): No  Physical Activity: Not on file  Stress: Not on file  Social Connections: Not on file  Intimate Partner Violence: Not At Risk (09/02/2022)   Humiliation, Afraid, Rape, and Kick questionnaire    Fear of Current or Ex-Partner: No    Emotionally Abused: No    Physically Abused: No    Sexually Abused: No    FAMILY HISTORY: Family History  Problem Relation Age of Onset   Diabetes Mother    Hypertension Father    Diabetes Sister    Diabetes Sister    Leukemia Sister    Hypertension Sister    Diabetes Brother    Colon cancer Brother    Diabetes Brother    Diabetes Brother     ALLERGIES:  is allergic to clindamycin, clindamycin/lincomycin, chocolate, chocolate flavor, food color red, and food color yellow.  MEDICATIONS:  Current Outpatient Medications  Medication Sig Dispense Refill   acetaminophen (TYLENOL) 500 MG tablet Take 500 mg by mouth every 6 (six) hours as  needed for mild pain.     amLODipine (NORVASC) 10 MG tablet Take 1 tablet (10 mg total) by mouth daily. 90 tablet 0   aspirin 81 MG EC tablet Take 81 mg by mouth See admin instructions. Takes on Tues and Thur only due to history of eye bleed.     B Complex-C (B-COMPLEX WITH VITAMIN C) tablet Take 1 tablet by mouth daily.     Ferrous Sulfate 142 (45 Fe) MG TBCR Take 1 tablet by mouth every morning.     insulin detemir (LEVEMIR FLEXTOUCH) 100 UNIT/ML FlexPen Inject 15 Units into the skin daily. 15 mL 0   JARDIANCE 10 MG TABS tablet Take 10 mg by mouth daily.     Omega-3 Fatty Acids (FISH OIL PO) Take 1 capsule by mouth daily.     sodium bicarbonate 650 MG tablet Take 1 tablet (650 mg total) by mouth 2 (two) times daily. 60 tablet 0   Turmeric 500 MG TABS Take 500 mg by mouth daily at 6 (six) AM.     vitamin C (ASCORBIC ACID) 500 MG tablet Take 500 mg by mouth 2 (two) times daily.     VITAMIN D PO Take 2 tablets by mouth daily.     zinc gluconate 50  MG tablet Take 1 tablet by mouth daily.     No current facility-administered medications for this visit.     Marland Kitchen  PHYSICAL EXAMINATION:   There were no vitals filed for this visit.  There were no vitals filed for this visit.   Physical Exam Vitals and nursing note reviewed.  HENT:     Head: Normocephalic and atraumatic.     Mouth/Throat:     Pharynx: Oropharynx is clear.  Eyes:     Extraocular Movements: Extraocular movements intact.     Pupils: Pupils are equal, round, and reactive to light.  Cardiovascular:     Rate and Rhythm: Normal rate and regular rhythm.  Pulmonary:     Comments: Decreased breath sounds bilaterally.  Abdominal:     Palpations: Abdomen is soft.  Musculoskeletal:        General: Normal range of motion.     Cervical back: Normal range of motion.  Skin:    General: Skin is warm.  Neurological:     General: No focal deficit present.     Mental Status: She is alert and oriented to person, place, and time.   Psychiatric:        Behavior: Behavior normal.        Judgment: Judgment normal.      LABORATORY DATA:  I have reviewed the data as listed Lab Results  Component Value Date   WBC 6.0 09/17/2022   HGB 9.1 (L) 09/17/2022   HCT 27.9 (L) 09/17/2022   MCV 91.5 09/17/2022   PLT 243 09/17/2022   Recent Labs    03/14/22 0330 03/15/22 0454 03/30/22 1540 05/23/22 1223 08/30/22 1600 08/31/22 0453 09/04/22 0356 09/05/22 0351 09/17/22 1231  NA 141   < > 138 136 137   < > 140 139 140  K 4.2   < > 3.9 4.2 4.9   < > 4.6 4.4 4.6  CL 116*   < > 106 105 114*   < > 117* 115* 115*  CO2 22   < > 25 23 17*   < > 19* 18* 20*  GLUCOSE 124*   < > 138* 140* 131*   < > 100* 102* 145*  BUN 39*   < > 37* 49* 46*   < > 54* 56* 52*  CREATININE 2.73*   < > 2.66* 3.07* 3.67*   < > 3.36* 3.19* 3.55*  CALCIUM 8.2*   < > 8.5* 8.2* 8.1*   < > 7.9* 8.4* 8.3*  GFRNONAA 17*   < > 18* 15* 12*   < > 14* 15* 13*  PROT 6.1*  --  7.3 7.4 5.9*  --   --   --   --   ALBUMIN 2.0*  --  2.9* 3.1* 2.8*  --   --   --   --   AST 28  --  14* 25 19  --   --   --   --   ALT 23  --  11 17 11   --   --   --   --   ALKPHOS 96  --  82 100 62  --   --   --   --   BILITOT 0.3  --  0.5 0.1* 0.4  --   --   --   --   BILIDIR <0.1  --   --   --   --   --   --   --   --  IBILI NOT CALCULATED  --   --   --   --   --   --   --   --    < > = values in this interval not displayed.      No results found.  ASSESSMENT & PLAN:   No problem-specific Assessment & Plan notes found for this encounter.    All questions were answered. The patient knows to call the clinic with any problems, questions or concerns.    Cammie Sickle, MD 11/17/2022 12:44 PM

## 2022-11-19 ENCOUNTER — Encounter: Payer: Self-pay | Admitting: Urology

## 2022-11-19 ENCOUNTER — Ambulatory Visit (INDEPENDENT_AMBULATORY_CARE_PROVIDER_SITE_OTHER): Payer: No Typology Code available for payment source | Admitting: Urology

## 2022-11-19 ENCOUNTER — Ambulatory Visit
Admission: RE | Admit: 2022-11-19 | Discharge: 2022-11-19 | Disposition: A | Discharge status: Home / Self Care | Payer: No Typology Code available for payment source | Attending: Urology | Admitting: Urology

## 2022-11-19 ENCOUNTER — Ambulatory Visit
Admission: RE | Admit: 2022-11-19 | Discharge: 2022-11-19 | Disposition: A | Discharge status: Home / Self Care | Payer: No Typology Code available for payment source | Source: Ambulatory Visit | Attending: Urology | Admitting: Urology

## 2022-11-19 VITALS — BP 182/72 | HR 82 | Ht 62.0 in | Wt 149.0 lb

## 2022-11-19 DIAGNOSIS — N201 Calculus of ureter: Secondary | ICD-10-CM | POA: Diagnosis not present

## 2022-11-19 DIAGNOSIS — R93421 Abnormal radiologic findings on diagnostic imaging of right kidney: Secondary | ICD-10-CM | POA: Diagnosis not present

## 2022-11-19 DIAGNOSIS — Z87442 Personal history of urinary calculi: Secondary | ICD-10-CM | POA: Diagnosis not present

## 2022-11-19 DIAGNOSIS — I878 Other specified disorders of veins: Secondary | ICD-10-CM | POA: Diagnosis not present

## 2022-11-19 NOTE — Progress Notes (Signed)
11/19/2022 10:33 AM   Dana [name] April 07, 1946 [id number]  Referring provider: Gladstone Lighter, MD [place],  [place] [id number]  Chief Complaint  Patient presents with   Nephrolithiasis    Urologic history: 1.  Personal history urinary calculi Stent placed 03/08/2022 for an obstructing right proximal ureteral calculus with UTI Ureteroscopic stone removal 04/08/2022  HPI: 76 y.o. female presents for 6-month follow-up visit.  She is doing well since last visit and has no complaints Denies flank, abdominal or pelvic pain   PMH: Past Medical History:  Diagnosis Date   Anemia    Arthritis    Chronic kidney disease    DM (diabetes mellitus), type 1 ([place])    History of kidney stones    Hypertension    Peripheral vascular disease ([place])     Surgical History: Past Surgical History:  Procedure Laterality Date   ABDOMINAL HYSTERECTOMY     COLONOSCOPY     COLONOSCOPY N/A 08/31/2022   Procedure: COLONOSCOPY;  Surgeon: Lesly Rubenstein, MD;  Location: [facility] ENDOSCOPY;  Service: Endoscopy;  Laterality: N/A;   CYSTOSCOPY W/ RETROGRADES Right 04/08/2022   Procedure: CYSTOSCOPY WITH RETROGRADE PYELOGRAM;  Surgeon: Abbie Sons, MD;  Location: [facility] ORS;  Service: Urology;  Laterality: Right;   CYSTOSCOPY WITH STENT PLACEMENT Right 03/08/2022   Procedure: CYSTOSCOPY WITH RIGHT URETERALSTENT PLACEMENT;  Surgeon: Abbie Sons, MD;  Location: [facility] ORS;  Service: Urology;  Laterality: Right;   CYSTOSCOPY/URETEROSCOPY/HOLMIUM LASER/STENT PLACEMENT Right 04/08/2022   Procedure: CYSTOSCOPY/URETEROSCOPY/HOLMIUM LASER/STENT PLACEMENT;  Surgeon: Abbie Sons, MD;  Location: [facility] ORS;  Service: Urology;  Laterality: Right;   ESOPHAGOGASTRODUODENOSCOPY N/A 08/31/2022   Procedure: ESOPHAGOGASTRODUODENOSCOPY (EGD);  Surgeon: Lesly Rubenstein, MD;  Location: [facility] ENDOSCOPY;  Service: Endoscopy;  Laterality: N/A;   EYE SURGERY Left 11/28/2021   HYSTERECTOMY  ABDOMINAL WITH SALPINGO-OOPHORECTOMY     URETEROSCOPY Right 03/08/2022   Procedure: URETEROSCOPY;  Surgeon: Abbie Sons, MD;  Location: [facility] ORS;  Service: Urology;  Laterality: Right;    Home Medications:  Allergies as of 11/19/2022       Reactions   Clindamycin Dermatitis, Rash   Clindamycin/lincomycin Rash   Chocolate    Nosebleed   Chocolate Flavor    Nosebleed   Food Color Red    Other reaction(s): Unknown Other reaction(s): Unknown   Food Color Yellow Other (See Comments)   Other reaction(s): Unknown Other reaction(s): Unknown Other reaction(s): Unknown Other reaction(s): Unknown Other reaction(s): Unknown        Medication List        Accurate as of November 19, 2022 10:33 AM. If you have any questions, ask your nurse or doctor.          acetaminophen 500 MG tablet Commonly known as: TYLENOL Take 500 mg by mouth every 6 (six) hours as needed for mild pain.   amLODipine 10 MG tablet Commonly known as: NORVASC Take 1 tablet (10 mg total) by mouth daily.   ascorbic acid 500 MG tablet Commonly known as: VITAMIN C Take 500 mg by mouth 2 (two) times daily.   aspirin EC 81 MG tablet Take 81 mg by mouth See admin instructions. Takes on Tues and Thur only due to history of eye bleed.   B-complex with vitamin C tablet Take 1 tablet by mouth daily.   Ferrous Sulfate 142 (45 Fe) MG Tbcr Take 1 tablet by mouth every morning.   FISH OIL PO Take 1 capsule by mouth daily.   Jardiance 10  MG Tabs tablet Generic drug: empagliflozin Take 10 mg by mouth daily.   Levemir FlexTouch 100 UNIT/ML FlexPen Generic drug: insulin detemir Inject 15 Units into the skin daily.   sodium bicarbonate 650 MG tablet Take 1 tablet (650 mg total) by mouth 2 (two) times daily.   Turmeric 500 MG Tabs Take 500 mg by mouth daily at 6 (six) AM.   VITAMIN D PO Take 2 tablets by mouth daily.   zinc gluconate 50 MG tablet Take 1 tablet by mouth daily.         Allergies:  Allergies  Allergen Reactions   Clindamycin Dermatitis and Rash   Clindamycin/Lincomycin Rash   Chocolate     Nosebleed   Chocolate Flavor      Nosebleed   Food Color Red     Other reaction(s): Unknown Other reaction(s): Unknown   Food Color Yellow Other (See Comments)    Other reaction(s): Unknown Other reaction(s): Unknown Other reaction(s): Unknown Other reaction(s): Unknown Other reaction(s): Unknown    Family History: Family History  Problem Relation Age of Onset   Diabetes Mother    Hypertension Father    Diabetes Sister    Diabetes Sister    Leukemia Sister    Hypertension Sister    Diabetes Brother    Colon cancer Brother    Diabetes Brother    Diabetes Brother     Social History:  reports that she has never smoked. She has never used smokeless tobacco. She reports that she does not drink alcohol and does not use drugs.   Physical Exam: BP (!) 182/72   Pulse 82   Ht 5\' 2"  (1.575 m)   Wt 149 lb (67.6 kg)   BMI 27.25 kg/m   Constitutional:  Alert and oriented, No acute distress. HEENT: [place] AT Respiratory: Normal respiratory effort, no increased work of breathing. Psychiatric: Normal mood and affect.     Pertinent Imaging: KUB performed today was personally reviewed and interpreted.  There are stippled calcification overlying the right renal outline.  Prior CT showed no right renal calculi   Assessment & Plan:    1.  Personal history urinary calculi Possible right nephrolithiasis on today's KUB Schedule RUS and will call with results   Abbie Sons, MD  [facility] [address] [place],  [address] [phone]

## 2022-11-25 DIAGNOSIS — M79671 Pain in right foot: Secondary | ICD-10-CM | POA: Diagnosis not present

## 2022-11-25 DIAGNOSIS — R030 Elevated blood-pressure reading, without diagnosis of hypertension: Secondary | ICD-10-CM | POA: Diagnosis not present

## 2022-11-27 ENCOUNTER — Emergency Department: Payer: No Typology Code available for payment source

## 2022-11-27 ENCOUNTER — Encounter: Payer: Self-pay | Admitting: *Deleted

## 2022-11-27 ENCOUNTER — Other Ambulatory Visit: Payer: Self-pay

## 2022-11-27 DIAGNOSIS — R059 Cough, unspecified: Secondary | ICD-10-CM | POA: Diagnosis not present

## 2022-11-27 DIAGNOSIS — U071 COVID-19: Secondary | ICD-10-CM | POA: Diagnosis not present

## 2022-11-27 DIAGNOSIS — I129 Hypertensive chronic kidney disease with stage 1 through stage 4 chronic kidney disease, or unspecified chronic kidney disease: Secondary | ICD-10-CM | POA: Diagnosis not present

## 2022-11-27 DIAGNOSIS — D649 Anemia, unspecified: Secondary | ICD-10-CM | POA: Diagnosis not present

## 2022-11-27 DIAGNOSIS — N189 Chronic kidney disease, unspecified: Secondary | ICD-10-CM | POA: Insufficient documentation

## 2022-11-27 LAB — CBC
HCT: 24.1 % — ABNORMAL LOW (ref 36.0–46.0)
Hemoglobin: 7.4 g/dL — ABNORMAL LOW (ref 12.0–15.0)
MCH: 29.5 pg (ref 26.0–34.0)
MCHC: 30.7 g/dL (ref 30.0–36.0)
MCV: 96 fL (ref 80.0–100.0)
Platelets: 216 10*3/uL (ref 150–400)
RBC: 2.51 MIL/uL — ABNORMAL LOW (ref 3.87–5.11)
RDW: 13.8 % (ref 11.5–15.5)
WBC: 7.4 10*3/uL (ref 4.0–10.5)
nRBC: 0 % (ref 0.0–0.2)

## 2022-11-27 LAB — BASIC METABOLIC PANEL
Anion gap: 7 (ref 5–15)
BUN: 40 mg/dL — ABNORMAL HIGH (ref 8–23)
CO2: 18 mmol/L — ABNORMAL LOW (ref 22–32)
Calcium: 8 mg/dL — ABNORMAL LOW (ref 8.9–10.3)
Chloride: 113 mmol/L — ABNORMAL HIGH (ref 98–111)
Creatinine, Ser: 4.07 mg/dL — ABNORMAL HIGH (ref 0.44–1.00)
GFR, Estimated: 11 mL/min — ABNORMAL LOW (ref >60)
Glucose, Bld: 99 mg/dL (ref 70–99)
Potassium: 4.6 mmol/L (ref 3.5–5.1)
Sodium: 138 mmol/L (ref 135–145)

## 2022-11-27 LAB — TROPONIN I (HIGH SENSITIVITY): Troponin I (High Sensitivity): 44 ng/L — ABNORMAL HIGH (ref <18)

## 2022-11-27 LAB — RESP PANEL BY RT-PCR (FLU A&B, COVID) ARPGX2
Influenza A by PCR: NEGATIVE
Influenza B by PCR: NEGATIVE
SARS Coronavirus 2 by RT PCR: POSITIVE — AB

## 2022-11-27 NOTE — ED Triage Notes (Signed)
Pt has a cough and sob.  Pt reports chest tightness and wheezing.  Sx began yesterday   pt alert.

## 2022-11-28 ENCOUNTER — Emergency Department
Admission: EM | Admit: 2022-11-28 | Discharge: 2022-11-28 | Disposition: A | Discharge status: Home / Self Care | Payer: No Typology Code available for payment source | Attending: Emergency Medicine | Admitting: Emergency Medicine

## 2022-11-28 DIAGNOSIS — D649 Anemia, unspecified: Secondary | ICD-10-CM

## 2022-11-28 DIAGNOSIS — N189 Chronic kidney disease, unspecified: Secondary | ICD-10-CM

## 2022-11-28 DIAGNOSIS — U071 COVID-19: Secondary | ICD-10-CM

## 2022-11-28 LAB — TYPE AND SCREEN
ABO/RH(D): O NEG
Antibody Screen: NEGATIVE
Unit division: 0

## 2022-11-28 LAB — BPAM RBC
Blood Product Expiration Date: 202401082359
Unit Type and Rh: 5100

## 2022-11-28 LAB — PREPARE RBC (CROSSMATCH)

## 2022-11-28 LAB — TROPONIN I (HIGH SENSITIVITY): Troponin I (High Sensitivity): 50 ng/L — ABNORMAL HIGH (ref <18)

## 2022-11-28 MED ORDER — ACETAMINOPHEN 325 MG PO TABS
650.0000 mg | ORAL_TABLET | Freq: Once | ORAL | Status: AC
  Administered 2022-11-28: 650 mg via ORAL
  Filled 2022-11-28: qty 2

## 2022-11-28 MED ORDER — SODIUM CHLORIDE 0.9 % IV SOLN: 10.0000 mL/h | Freq: Once | INTRAVENOUS | Status: DC

## 2022-11-28 NOTE — ED Provider Notes (Signed)
[facility] Provider Note    Event Date/Time   First MD Initiated Contact with Patient 11/28/22 0119     (approximate)   History   Cough and Shortness of Breath   HPI  Dana [name] is a 76 y.o. female with a history of chronic kidney disease and anemia who presents for evaluation of about 2 days of cough, nasal congestion, sore throat, runny nose, general malaise and fatigue, and some chest tightness particular when she coughs.  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  This patient reports that she has had COVID in the past at some point last year.  She is not in substantial distress and was planning to go to work tomorrow but thought she should get checked out.  She is not having ongoing chest pain.  She sees a local nephrologist for her ongoing kidney issues.  She has had infusions in the past and has had to have transfusions of blood during prior hospitalizations when her hemoglobin dropped below 4, but she says she does not feel that bad at this time.  No recent nausea or vomiting or diarrhea.     Physical Exam   Triage Vital Signs: ED Triage Vitals  Enc Vitals Group     BP 11/27/22 2151 (!) 183/79     Pulse Rate 11/27/22 2151 95     Resp --      Temp 11/27/22 2151 100.1 F (37.8 C)     Temp Source 11/27/22 2151 Oral     SpO2 11/27/22 2151 96 %     Weight 11/27/22 2148 68 kg (150 lb)     Height 11/27/22 2148 1.6 m (5\' 3" )     Head Circumference --      Peak Flow --      Pain Score 11/27/22 2148 9     Pain Loc --      Pain Edu? --      Excl. in [place]? --     Most recent vital signs: Vitals:   11/28/22 0300 11/28/22 0330  BP: (!) 154/63 138/65  Pulse: 93 83  Resp: 17 (!) 21  Temp:    SpO2: 96% 94%     General: Awake, no distress.  Somewhat surprisingly well-appearing under the circumstances. CV:  Good  peripheral perfusion.  Regular rate and rhythm, no tachycardia. Resp:  Normal effort.  Lungs are clear to auscultation.  Occasional cough but her lungs have good air movement and no coarse or wet breath sounds. Abd:  No distention.  No tenderness to palpation of the abdomen. Other:  Awake, alert, oriented, sharp, good conversationalist.   ED Results / Procedures / Treatments   Labs (all labs ordered are listed, but only abnormal results are displayed) Labs Reviewed  RESP PANEL BY RT-PCR (FLU A&B, COVID) ARPGX2 - Abnormal; Notable for the following components:      Result Value   SARS Coronavirus 2 by RT PCR POSITIVE (*)    All other components within normal limits  BASIC METABOLIC PANEL - Abnormal; Notable for the following components:   Chloride 113 (*)    CO2 18 (*)    BUN 40 (*)    Creatinine, Ser 4.07 (*)    Calcium 8.0 (*)    GFR, Estimated 11 (*)    All other components within normal limits  CBC - Abnormal; Notable for the following components:   RBC 2.51 (*)    Hemoglobin 7.4 (*)    HCT 24.1 (*)    All other components within normal limits  TROPONIN I (HIGH SENSITIVITY) - Abnormal; Notable for the following components:   Troponin I (High Sensitivity) 44 (*)    All other components within normal limits  TROPONIN I (HIGH SENSITIVITY) - Abnormal; Notable for the following components:   Troponin I (High Sensitivity) 50 (*)    All other components within normal limits  PREPARE RBC (CROSSMATCH)  TYPE AND SCREEN     EKG  ED ECG REPORT I, Hinda Kehr, the attending physician, personally viewed and interpreted this ECG.  Date: 11/27/2022 EKG Time: 21: 57 Rate: 93 Rhythm: normal sinus rhythm QRS Axis: normal Intervals: normal ST/T Wave abnormalities: normal Narrative Interpretation: no evidence of acute ischemia    RADIOLOGY I viewed and interpreted the patient's 1 view chest x-ray.  I see no evidence of infiltrate or pulmonary edema.  I also read the  radiologist's report, which confirmed no acute findings.    PROCEDURES:  Critical Care performed: No  .1-3 Lead EKG Interpretation  Performed by: Hinda Kehr, MD Authorized by: Hinda Kehr, MD     Interpretation: normal     ECG rate:  95   ECG rate assessment: normal     Rhythm: sinus rhythm     Ectopy: none     Conduction: normal      MEDICATIONS ORDERED IN ED: Medications  0.9 %  sodium chloride infusion (has no administration in time range)  acetaminophen (TYLENOL) tablet 650 mg (650 mg Oral Given 11/28/22 0228)     IMPRESSION / MDM / ASSESSMENT AND PLAN / ED COURSE  I reviewed the triage vital signs and the nursing notes.                              Differential diagnosis includes, but is not limited to, viral infection, pneumonia, worsening renal failure, electrolyte or metabolic abnormality, symptomatic anemia, much less likely ACS or PE.  Patient's presentation is most consistent with acute presentation with potential threat to life or bodily function.  Labs/studies ordered: Respiratory viral panel, CBC, basic metabolic panel, high-sensitivity troponin, type and screen, 1 view chest x-ray, cardiac monitoring.  As documented above, the patient's chest x-ray is clear.  Her labs notable for the following: Hemoglobin of 7.4, dropped by 1.7 points in the last 2 months, COVID-positive, slightly worsening renal function with a creatinine of 4.07 (GFR of 11 versus about 13 baseline).  High-sensitivity troponin is essentially normal for her.  The patient is almost surprisingly well-appearing and active in spite of her chronic medical conditions and her acute presenting complaints.  There is no indication to admit her to the hospital for COVID treatment.  However, I am concerned about her worsening and symptomatic anemia in the setting of acute COVID infection.  I feel that she will be better able to recover on her own without hospitalization if she is provided with a blood  transfusion.  I considered hospitalization and is still may be necessary, but at this time I think she would be better off at home.  She also does not want to stay in the hospital if not necessary.  I talked with her about the risks and benefits and she gave her consent for blood transfusion.  I will contact the blood bank and explained  the situation.  She also explained that she is O- and that they try to give her a positive in the past "since I am not of childbearing age" but she is adamant that she will refuse any blood that is not O-.  I will talk with the blood bank about this as well.  The patient is on the cardiac monitor to evaluate for evidence of arrhythmia and/or significant heart rate changes.   Clinical Course as of 11/28/22 0400  Fri Nov 28, 2022  0154 [place] with the blood bank.  I explained the situation and why the patient needs a unit of blood even though her hemoglobin is not less than 7 (as described above).  I also explained the patient's anticipated refusal of O+ blood.  They understand and will type and crossmatch a unit of O-.  I talked with her ED nurse, Abby, and she knows the plan for blood administration over 2 hours followed by discharge. [CF]  1007 I received a phone call from the blood bank.  They told me that O- is in such a limited supply that she called her manager, but they cannot release O- blood because the limited supply they have must be kept for emergent use on the OB/GYN service.  She assured me that oh positive blood would be acceptable in this patient it would not cause a reaction.  I updated the patient who is resting comfortably.  I explained to her again as has been explained in the past that it is perfectly safe for her to get a positive blood, but she refuses to do so.  I explained I have no other choice at this time.  She said that she understands and that she will go home and follow-up in the infusion clinic and with her regular doctor.  I  reiterated my usual COVID recommendations and return precautions.  She is in no distress with stable and normal vital signs, no respiratory distress or accessory muscle usage. [CF]    Clinical Course User Index [CF] Hinda Kehr, MD     FINAL CLINICAL IMPRESSION(S) / ED DIAGNOSES   Final diagnoses:  COVID-19  Acute on chronic anemia  Chronic kidney disease, unspecified CKD stage     Rx / DC Orders   ED Discharge Orders     None        Note:  This document was prepared using Dragon voice recognition software and may include unintentional dictation errors.   Hinda Kehr, MD 11/28/22 0400

## 2022-11-28 NOTE — Discharge Instructions (Signed)
As we discussed, you have COVID-19, which is the cause of your symptoms.  You need to take your regular medications and drink plenty of fluids to stay hydrated.  We considered a medication called Paxlovid, but given the degree of your kidney dysfunction, it is generally not recommended.  However, you can discuss the possibility with your primary care doctor.  Your hemoglobin has also dropped down to 7.4, which is getting close to the point of needing a transfusion.  We offered to give you a blood transfusion tonight, but unfortunately we do not have extra O- blood that we can transfuse.  We offered to give you O+ blood because it will not cause any issues for you, but your preference is to decline at this time.  We recommend that you follow-up with your primary care doctor and/or with Dr. Rogue Bussing, who has seen you before in the infusion center, to discuss the possibility of an outpatient transfusion to help with your anemia.    Return to the emergency department if you develop new or worsening symptoms that concern you.

## 2023-02-23 ENCOUNTER — Other Ambulatory Visit: Payer: Self-pay | Admitting: Nephrology

## 2023-02-23 DIAGNOSIS — R809 Proteinuria, unspecified: Secondary | ICD-10-CM

## 2023-04-04 IMAGING — MR MR HIP*L* W/O CM
5 series · 39 of 40 positions shown · non-contrast
Comparison: CT abdomen pelvis 03/08/2022

CLINICAL DATA: Hip pain, stress fracture suspected, neg xray L hip
pain

EXAM:
MR OF THE LEFT HIP WITHOUT CONTRAST
TECHNIQUE: Multiplanar, multisequence MR imaging was performed. No intravenous
contrast was administered.

[Series 14: T1 · coronal · left · 4.0mm · 1.19mm/px · 9 of 40 slices shown]
[im 1/40]
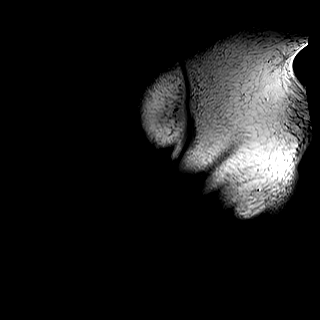
[im 5/40]
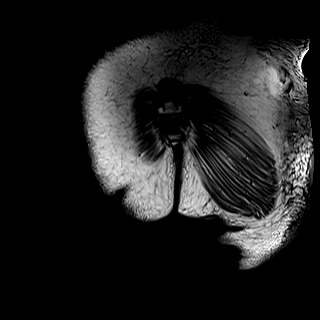
[im 10/40]
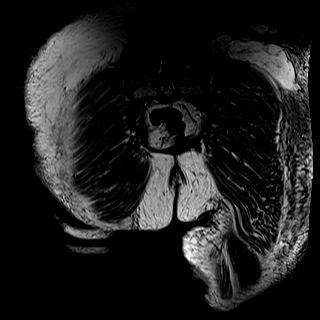
[im 15/40]
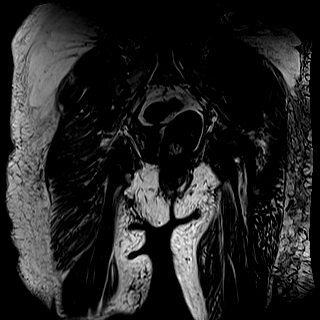
[im 20/40]
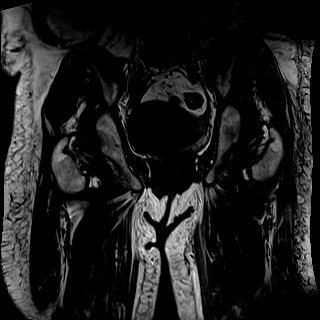
[im 25/40]
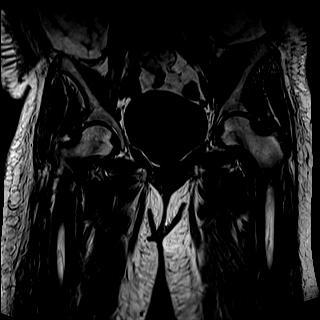
[im 30/40]
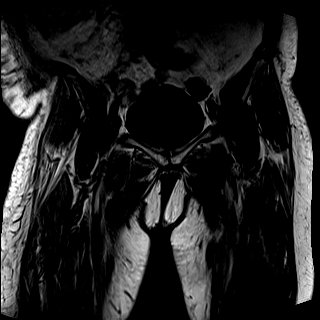
[im 35/40]
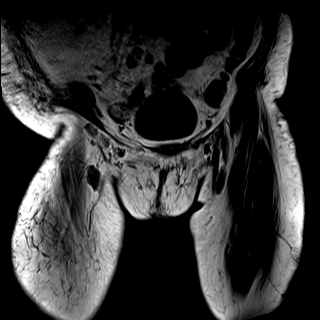
[im 40/40]
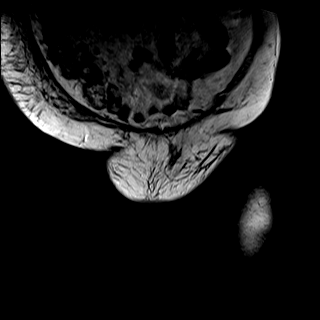

[Series 15: STIR · coronal · left · 4.0mm · 1.19mm/px · 9 of 40 slices shown]
[im 1/40]
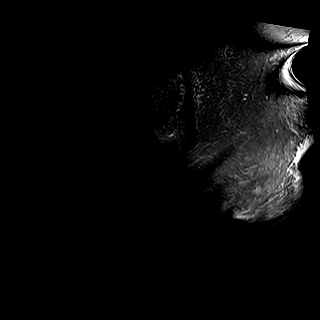
[im 5/40]
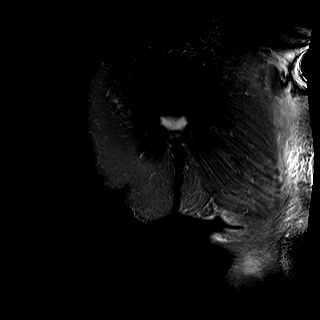
[im 9/40]
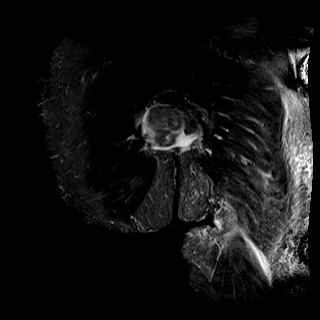
[im 14/40]
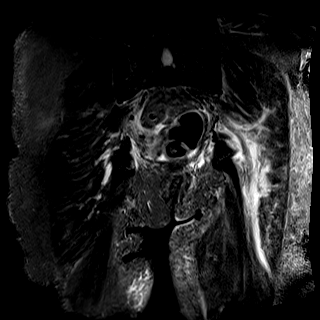
[im 18/40]
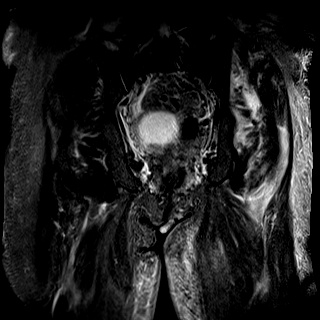
[im 22/40]
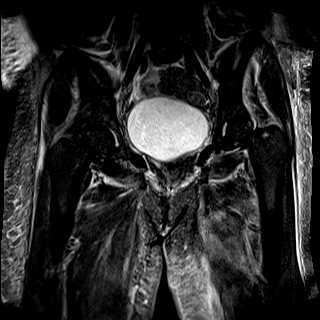
[im 27/40]
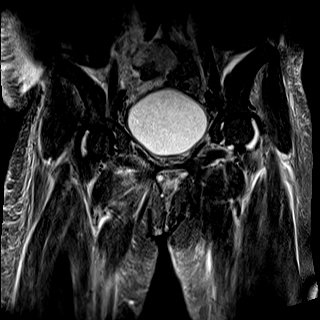
[im 35/40]
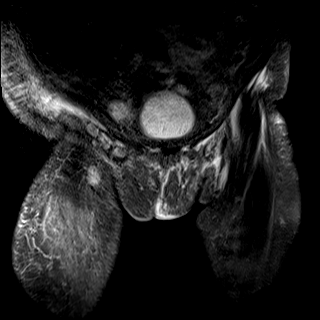
[im 40/40]
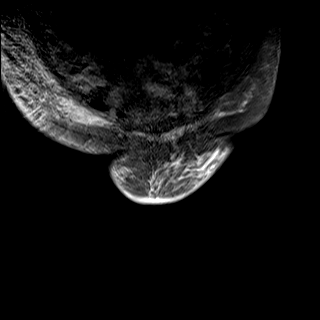

[Series 16: T2 fat-sat · axial · left · 4.0mm · 0.70mm/px · z∈[-85,+59]mm · 7 of 30 slices shown]
[im 1/30]
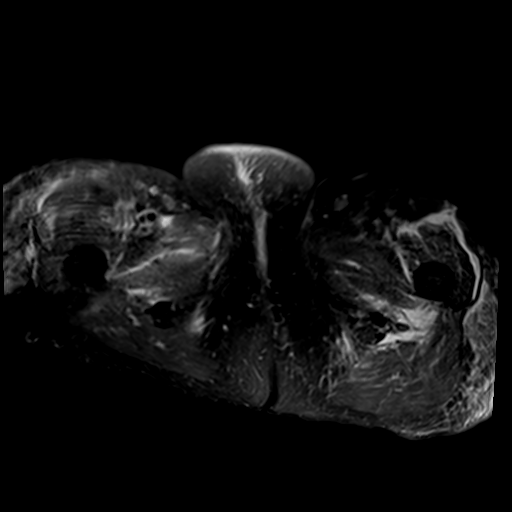
[im 5/30]
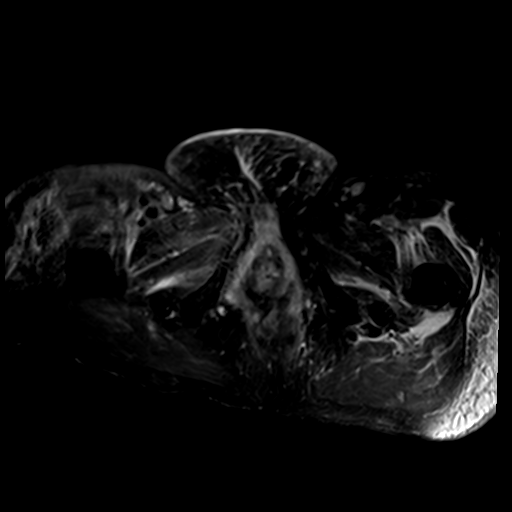
[im 10/30]
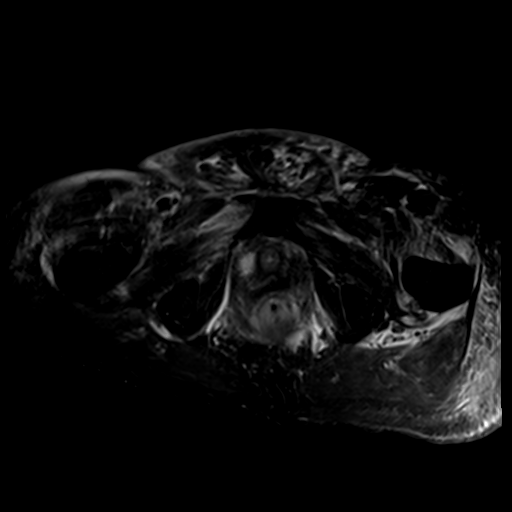
[im 15/30]
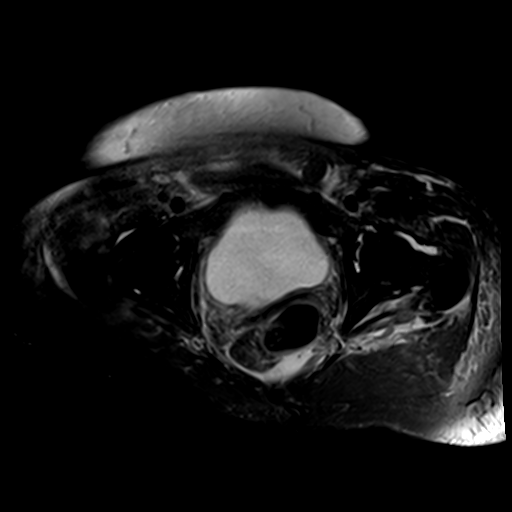
[im 20/30]
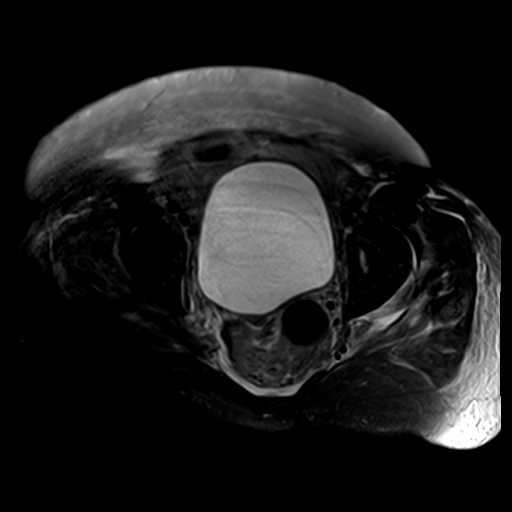
[im 25/30]
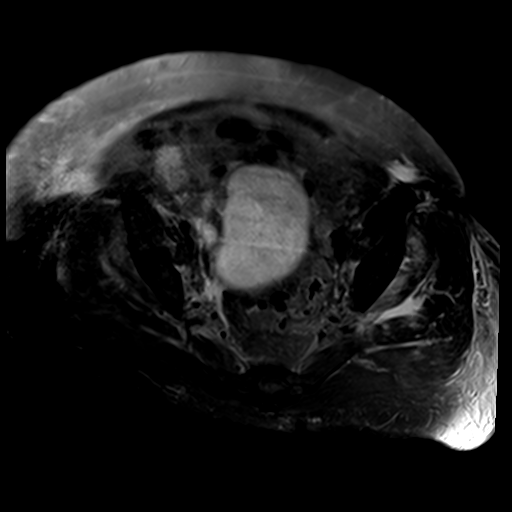
[im 30/30]
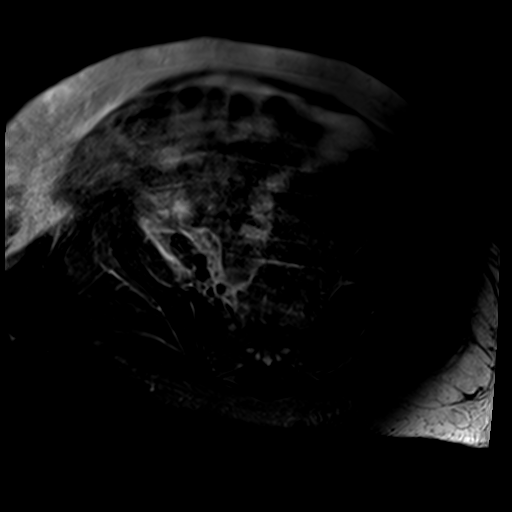

[Series 17: PD fat-sat · sagittal · left · 4.0mm · 0.70mm/px · 7 of 29 slices shown (1 of 2)]
[im 1/29]
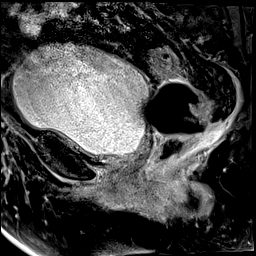
[im 5/29]
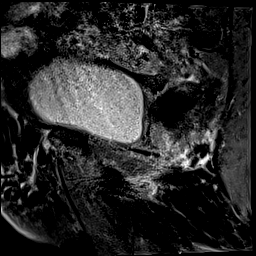
[im 10/29]
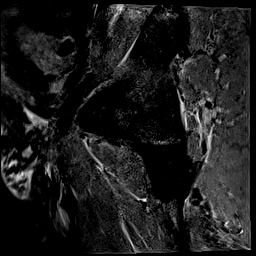
[im 15/29]
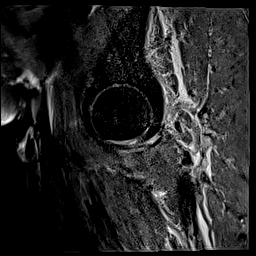
[im 19/29]
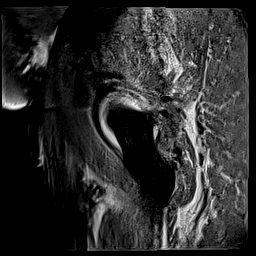
[im 24/29]
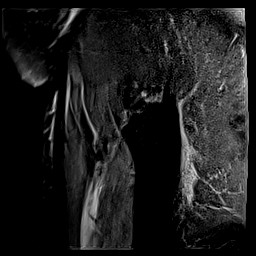
[im 29/29]
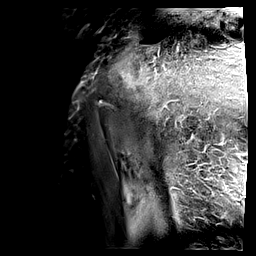

[Series 18: PD fat-sat · coronal · left · 4.0mm · 0.70mm/px · 7 of 29 slices shown (2 of 2)]
[im 1/29]
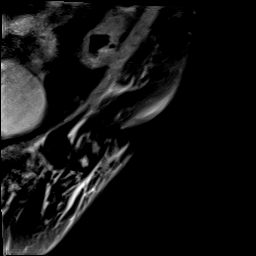
[im 5/29]
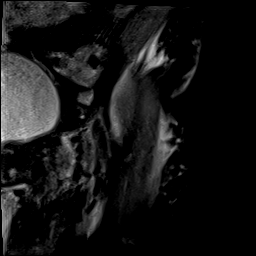
[im 10/29]
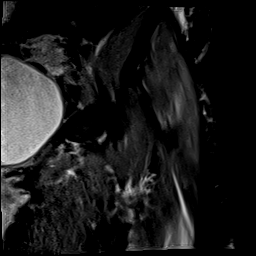
[im 15/29]
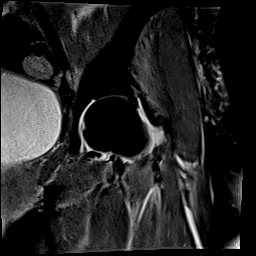
[im 19/29]
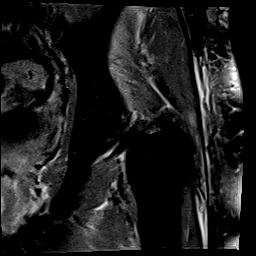
[im 24/29]
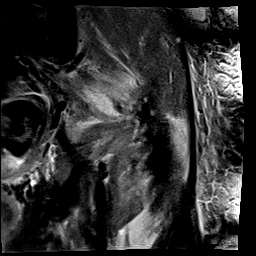
[im 29/29]
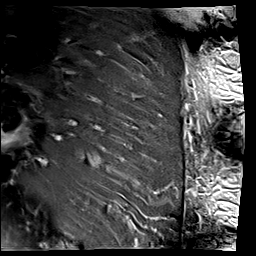

[39 of 40 positions shown; findings below may reference images not displayed]

FINDINGS: Bones: There is no evidence of acute fracture, dislocation or
avascular necrosis. No focal bone lesion. Mild degenerative changes
of the pubic symphysis. Degenerative changes of the lower lumbar
spine, see separately dictated recent lumbar spine MRI.

Articular cartilage and labrum

Articular cartilage:  Mild chondrosis.

Labrum: Degenerative anterior superior labral tearing.

Joint or bursal effusion

Joint effusion: Small, left greater than right hip effusions.

Bursae: Mild trochanteric bursal fluid bilaterally.

Muscles and tendons

Muscles and tendons: The gluteal tendons are intact. The proximal
hamstring tendons demonstrate mild tendinosis.There is diffuse
intramuscular edema involving the hip and pelvic musculature.

Other findings

Miscellaneous: Subcutaneous edema noted along the hips bilaterally
and lower anterior abdominal wall. There is significant edema and
fluid along the left sciatic nerve.
IMPRESSION: Diffuse intramuscular edema involving the hip and pelvic musculature
suggestive of polymyositis or potentially rhabdomyolysis. Correlate
with creatine [place].

Significant edema and fluid surrounding the left sciatic nerve
potentially resulting in sciatic nerve irritation.

Nonspecific subcutaneous edema noted along the lower anterior
abdominal wall and along the hips.

No evidence of acute fracture. Mild left hip osteoarthritis with
degenerative anterior superior labral tearing.

## 2023-04-21 IMAGING — CT CT ABD-PELV W/O CM
2 of 4 series · 16 of 46 positions shown, 18 images · non-contrast
Comparison: CT dated March 08, 2022 and March 04, 2022

CLINICAL DATA: Abdominal pain, acute, nonlocalized



[Series 2: ap without · axial · non-contrast · 0.72mm/px · z∈[-942,-527]mm · 13 of 93 slices shown, 15 images]
[im 5/93  soft-tissue]
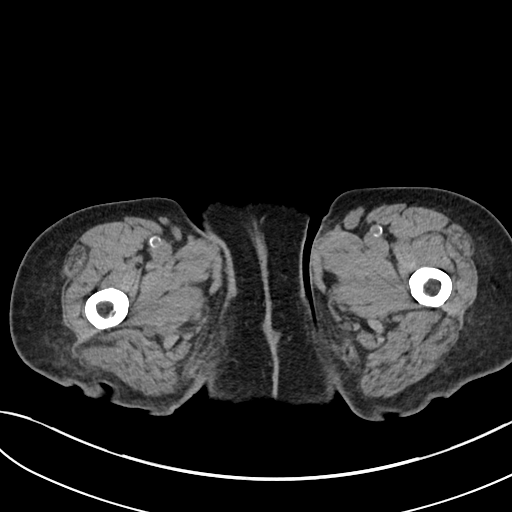
[im 5/93  bone]
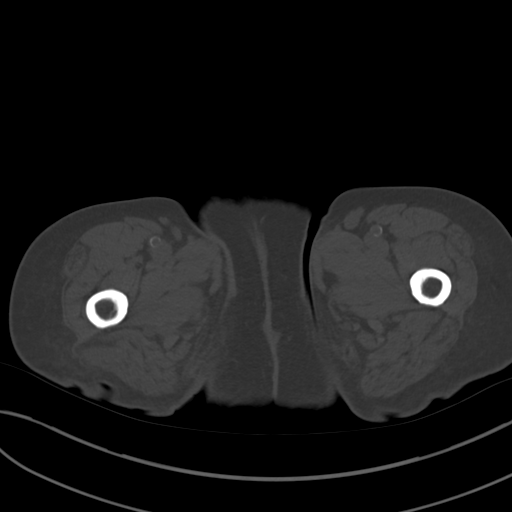
[im 13/93  soft-tissue]
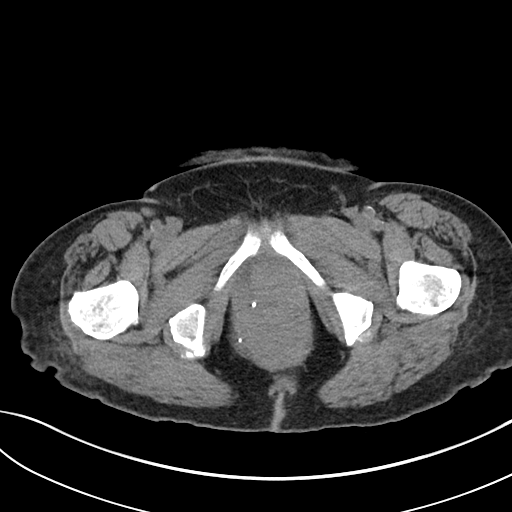
[im 21/93  soft-tissue]
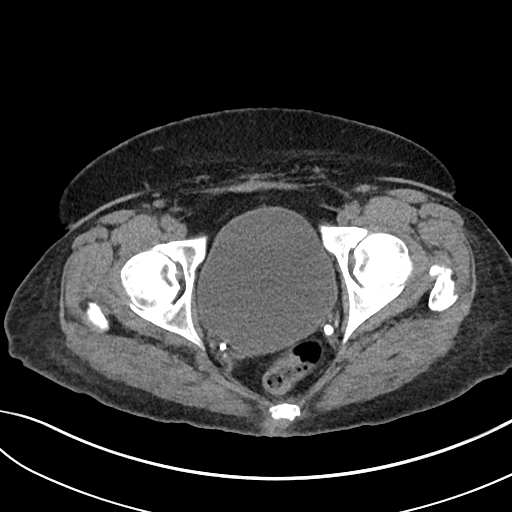
[im 26/93  soft-tissue]
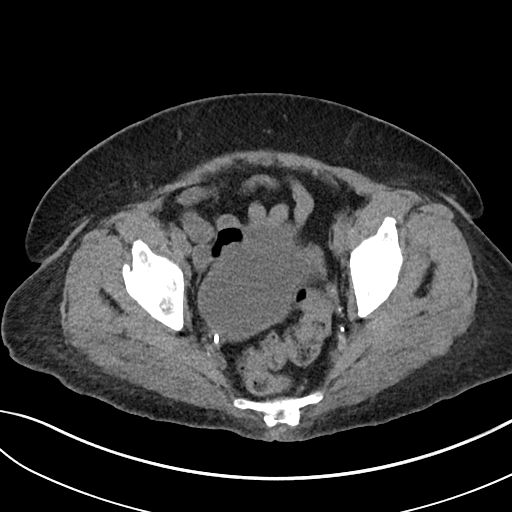
[im 34/93  soft-tissue]
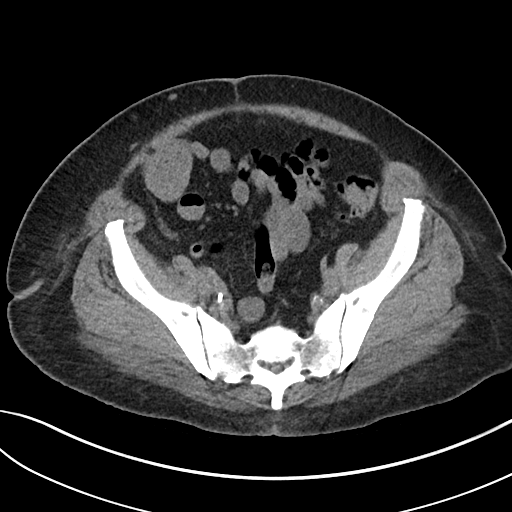
[im 38/93  soft-tissue]
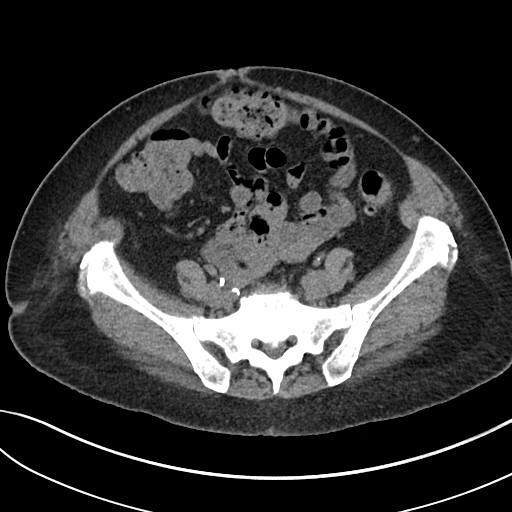
[im 47/93  soft-tissue]
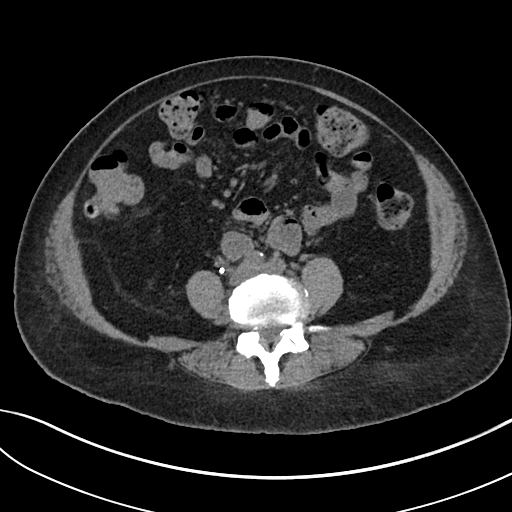
[im 55/93  soft-tissue]
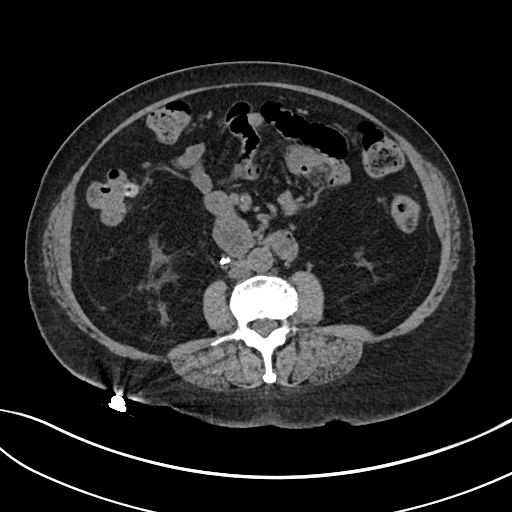
[im 59/93  soft-tissue]
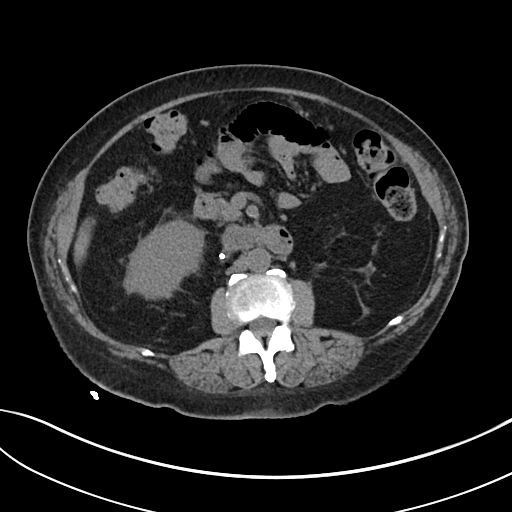
[im 59/93  bone]
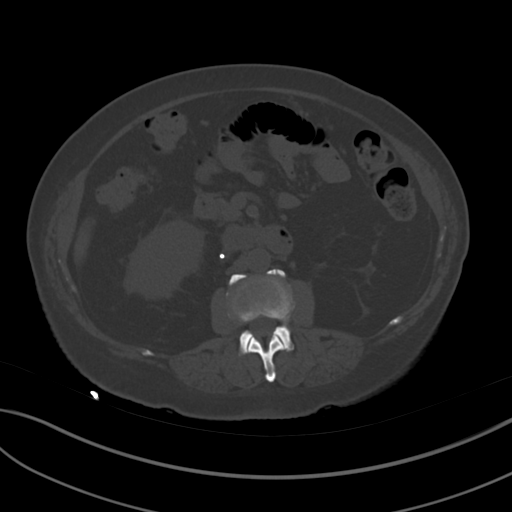
[im 67/93  soft-tissue]
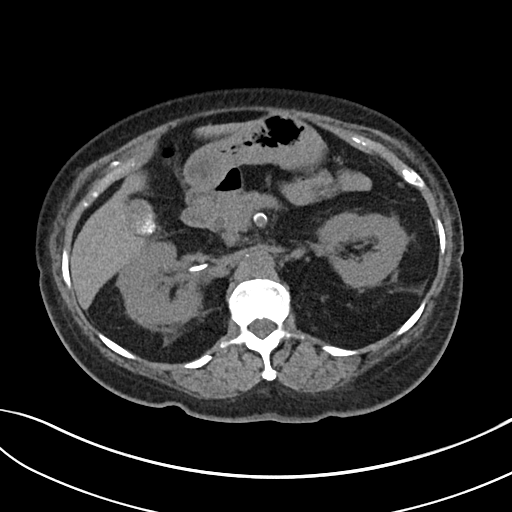
[im 72/93  soft-tissue]
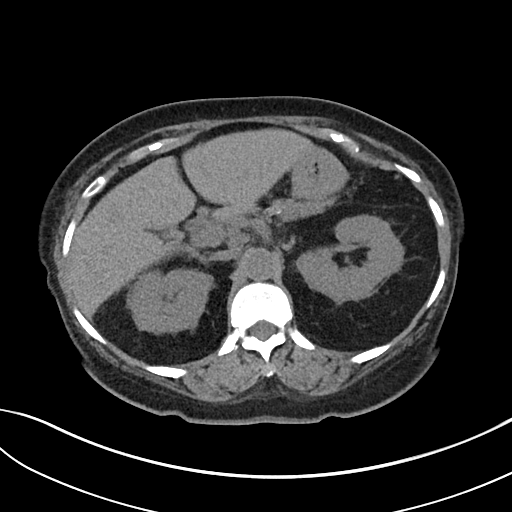
[im 80/93  soft-tissue]
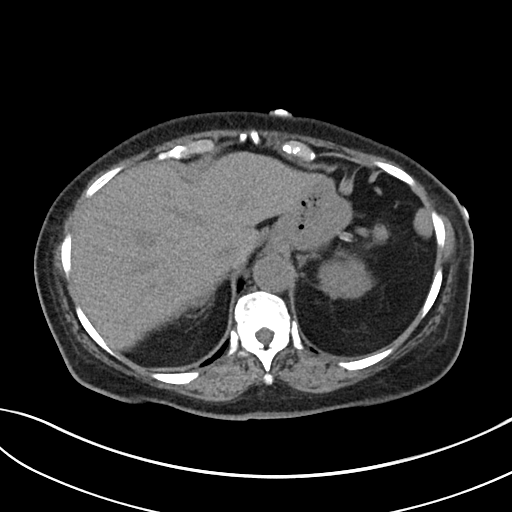
[im 88/93  soft-tissue]
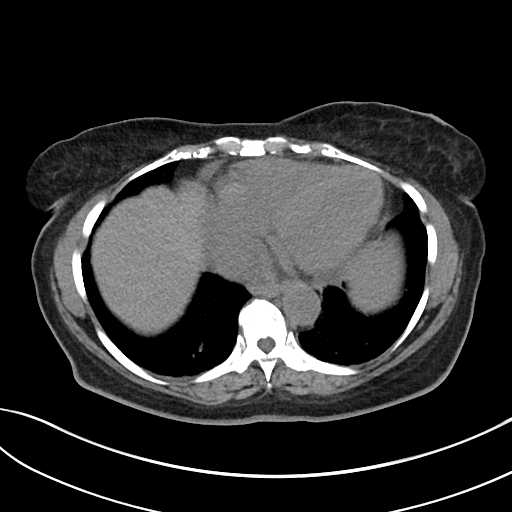

[Series 4: cor · coronal · 0.76mm/px · 3 of 95 slices shown]
[im 32/95  soft-tissue]
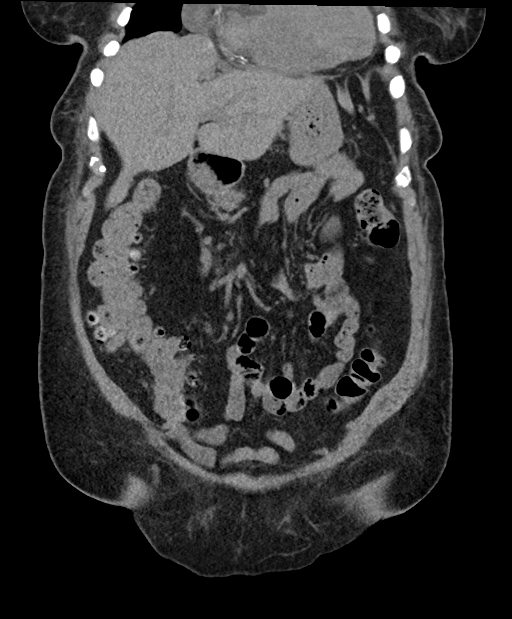
[im 42/95  soft-tissue]
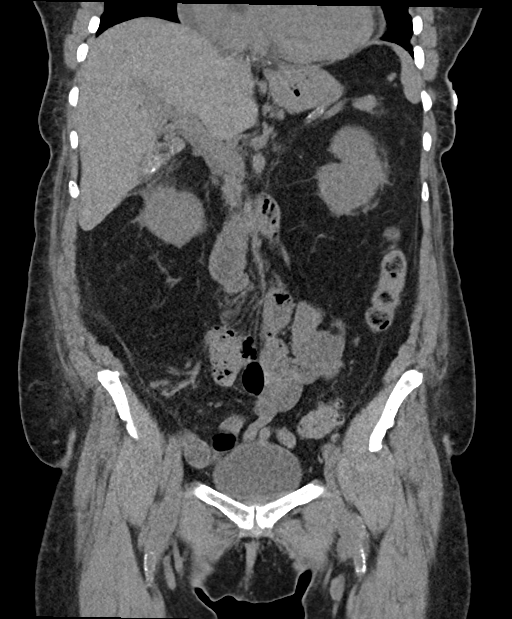
[im 53/95  soft-tissue]
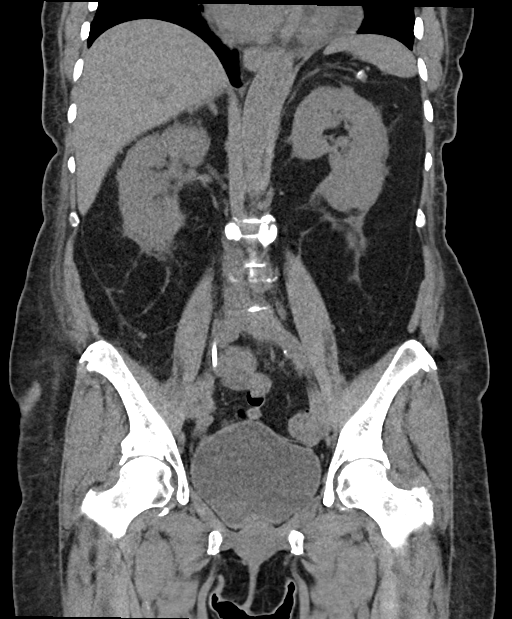

[16 of 46 positions shown; findings below may reference images not displayed]

FINDINGS: Evaluation is limited by lack of IV contrast.

Lower chest: Mild ground-glass opacities of bilateral bases likely
reflecting degree of underlying pulmonary edema. Cardiomegaly. Small
pericardial effusion, similar in comparison to prior.

Hepatobiliary: Cholelithiasis. Unremarkable noncontrast appearance
of the liver.

Pancreas: No peripancreatic fat stranding.

Spleen: Unremarkable.

Adrenals/Urinary Tract: Adrenal glands are unremarkable. Interval
placement of a RIGHT-sided double-J stent. There is corresponding
decrease in RIGHT-sided hydronephrosis. There is a 3 mm radiopaque
density adjacent to the stent in the mid ureter which may be within
the ureter and may reflect residual nephrolithiasis versus closely
adherent phlebolith; exact anatomic location is difficult to
ascertain without IV contrast and delayed imaging. Bladder is mildly
distended. No LEFT-sided hydronephrosis.

Stomach/Bowel: No evidence of bowel obstruction. Diverticulosis
without evidence of acute diverticulitis. Moderate to large colonic
stool burden diffusely throughout the colon. Moderate hiatal hernia.
Prominence of the gastric folds.

Vascular/Lymphatic: Atherosclerotic calcifications of the
nonaneurysmal aorta. No new suspicious lymphadenopathy.

Reproductive: Status post hysterectomy. No adnexal masses.

Other: No free air or free fluid.

Musculoskeletal: No acute or significant osseous findings.
IMPRESSION: 1. Interval resolution of RIGHT-sided hydroureteronephrosis status
post placement of a RIGHT-sided nephroureteral stent. Stent is in
expected position.
2. Moderate hiatal hernia with prominence of the gastric folds which
could reflect underlying gastritis.
3. Cardiomegaly with likely mild pulmonary edema.

Aortic Atherosclerosis ([id number]-LJ2.2).

## 2023-10-08 ENCOUNTER — Encounter: Payer: Self-pay | Admitting: Internal Medicine

## 2023-10-08 ENCOUNTER — Other Ambulatory Visit: Payer: Self-pay | Admitting: Internal Medicine

## 2023-10-08 DIAGNOSIS — I739 Peripheral vascular disease, unspecified: Secondary | ICD-10-CM

## 2023-10-13 ENCOUNTER — Ambulatory Visit: Admission: RE | Admit: 2023-10-13 | Payer: Self-pay | Source: Ambulatory Visit

## 2023-11-16 ENCOUNTER — Emergency Department
Admission: EM | Admit: 2023-11-16 | Discharge: 2023-11-16 | Disposition: A | Discharge status: Home / Self Care | Payer: Medicare Other | Attending: Emergency Medicine | Admitting: Emergency Medicine

## 2023-11-16 ENCOUNTER — Other Ambulatory Visit: Payer: Self-pay

## 2023-11-16 ENCOUNTER — Encounter: Payer: Self-pay | Admitting: Internal Medicine

## 2023-11-16 ENCOUNTER — Emergency Department: Payer: Medicare Other

## 2023-11-16 DIAGNOSIS — E875 Hyperkalemia: Secondary | ICD-10-CM | POA: Diagnosis not present

## 2023-11-16 DIAGNOSIS — R600 Localized edema: Secondary | ICD-10-CM | POA: Diagnosis not present

## 2023-11-16 DIAGNOSIS — D649 Anemia, unspecified: Secondary | ICD-10-CM | POA: Insufficient documentation

## 2023-11-16 DIAGNOSIS — R0602 Shortness of breath: Secondary | ICD-10-CM | POA: Diagnosis present

## 2023-11-16 DIAGNOSIS — N189 Chronic kidney disease, unspecified: Secondary | ICD-10-CM | POA: Diagnosis not present

## 2023-11-16 DIAGNOSIS — N179 Acute kidney failure, unspecified: Secondary | ICD-10-CM | POA: Insufficient documentation

## 2023-11-16 LAB — CBC
HCT: 22.7 % — ABNORMAL LOW (ref 36.0–46.0)
Hemoglobin: 6.9 g/dL — ABNORMAL LOW (ref 12.0–15.0)
MCH: 29.1 pg (ref 26.0–34.0)
MCHC: 30.4 g/dL (ref 30.0–36.0)
MCV: 95.8 fL (ref 80.0–100.0)
Platelets: 167 10*3/uL (ref 150–400)
RBC: 2.37 MIL/uL — ABNORMAL LOW (ref 3.87–5.11)
RDW: 14.5 % (ref 11.5–15.5)
WBC: 5.7 10*3/uL (ref 4.0–10.5)
nRBC: 0 % (ref 0.0–0.2)

## 2023-11-16 LAB — BASIC METABOLIC PANEL
Anion gap: 7 (ref 5–15)
BUN: 54 mg/dL — ABNORMAL HIGH (ref 8–23)
CO2: 18 mmol/L — ABNORMAL LOW (ref 22–32)
Calcium: 7.8 mg/dL — ABNORMAL LOW (ref 8.9–10.3)
Chloride: 114 mmol/L — ABNORMAL HIGH (ref 98–111)
Creatinine, Ser: 6.49 mg/dL — ABNORMAL HIGH (ref 0.44–1.00)
GFR, Estimated: 6 mL/min — ABNORMAL LOW (ref >60)
Glucose, Bld: 87 mg/dL (ref 70–99)
Potassium: 6.1 mmol/L — ABNORMAL HIGH (ref 3.5–5.1)
Sodium: 139 mmol/L (ref 135–145)

## 2023-11-16 LAB — BRAIN NATRIURETIC PEPTIDE: B Natriuretic Peptide: 1043.4 pg/mL — ABNORMAL HIGH (ref 0.0–100.0)

## 2023-11-16 NOTE — Discharge Instructions (Signed)
As discussed, please go directly to the [facility] emergency department for further evaluation and management of your worsening renal function, hyperkalemia, and symptomatic anemia.

## 2023-11-16 NOTE — ED Provider Notes (Signed)
[facility] Provider Note   Event Date/Time   First MD Initiated Contact with Patient 11/16/23 1544     (approximate) History  Shortness of Breath  HPI Dana [name] is a 77 y.o. female with a stated past medical history of CKD who presents complaining of of worsening shortness of breath on exertion.  Patient also endorses associated bilateral lower extremity edema. ROS: Patient currently denies any vision changes, tinnitus, difficulty speaking, facial droop, sore throat, chest pain, abdominal pain, nausea/vomiting/diarrhea, dysuria, or weakness/numbness/paresthesias in any extremity   Physical Exam  Triage Vital Signs: ED Triage Vitals  Encounter Vitals Group     BP 11/16/23 1248 (!) 196/83     Systolic BP Percentile --      Diastolic BP Percentile --      Pulse Rate 11/16/23 1248 60     Resp 11/16/23 1248 20     Temp 11/16/23 1248 98.1 F (36.7 C)     Temp Source 11/16/23 1248 Oral     SpO2 11/16/23 1248 97 %     Weight --      Height --      Head Circumference --      Peak Flow --      Pain Score 11/16/23 1245 0     Pain Loc --      Pain Education --      Exclude from Growth Chart --    Most recent vital signs: Vitals:   11/16/23 1248 11/16/23 1610  BP: (!) 196/83 (!) 212/76  Pulse: 60 62  Resp: 20 (!) 8  Temp: 98.1 F (36.7 C)   SpO2: 97% 100%   General: Awake, oriented x4. CV:  Good peripheral perfusion.  Resp:  Normal effort.  Abd:  No distention.  Other:  Elderly overweight African-American female resting comfortably in no acute distress.  Bilateral lower extremity edema ED Results / Procedures / Treatments  Labs (all labs ordered are listed, but only abnormal results are displayed) Labs Reviewed  CBC - Abnormal; Notable for the following components:      Result Value   RBC 2.37 (*)    Hemoglobin 6.9 (*)    HCT 22.7 (*)    All other components within normal limits  BASIC METABOLIC PANEL - Abnormal; Notable for the following  components:   Potassium 6.1 (*)    Chloride 114 (*)    CO2 18 (*)    BUN 54 (*)    Creatinine, Ser 6.49 (*)    Calcium 7.8 (*)    GFR, Estimated 6 (*)    All other components within normal limits  BRAIN NATRIURETIC PEPTIDE - Abnormal; Notable for the following components:   B Natriuretic Peptide 1,043.4 (*)    All other components within normal limits   EKG ED ECG REPORT I, Merwyn Katos, the attending physician, personally viewed and interpreted this ECG. Date: 11/16/2023 EKG Time: 1254 Rate: 58 Rhythm: Bradycardic sinus rhythm QRS Axis: normal Intervals: normal ST/T Wave abnormalities: normal Narrative Interpretation: Bradycardic sinus rhythm.  No evidence of acute ischemia RADIOLOGY ED MD interpretation: 2 view chest x-ray interpreted by me shows no evidence of acute abnormalities including no pneumonia, pneumothorax, or widened mediastinum -Agree with radiology assessment Official radiology report(s): DG Chest 2 View  Result Date: 11/16/2023 CLINICAL DATA:  Shortness of breath. EXAM: CHEST - 2 VIEW COMPARISON:  November 27, 2022. FINDINGS: Stable cardiomegaly. Both lungs are clear. The visualized skeletal structures are unremarkable. IMPRESSION: No active cardiopulmonary  disease. Electronically Signed   By: Lupita Raider M.D.   On: 11/16/2023 13:55   PROCEDURES: Critical Care performed: No .1-3 Lead EKG Interpretation  Performed by: Merwyn Katos, MD Authorized by: Merwyn Katos, MD     Interpretation: normal     ECG rate:  71   ECG rate assessment: normal     Rhythm: sinus rhythm     Ectopy: none     Conduction: normal    MEDICATIONS ORDERED IN ED: Medications - No data to display IMPRESSION / MDM / ASSESSMENT AND PLAN / ED COURSE  I reviewed the triage vital signs and the nursing notes.                             The patient is on the cardiac monitor to evaluate for evidence of arrhythmia and/or significant heart rate changes. Patient's presentation is  most consistent with acute presentation with potential threat to life or bodily function. The patient is suffering from shortness of breath.  Potential causes considered include, but are not limited to, asthma or COPD, congestive heart failure, pulmonary embolism, pneumothorax, coronary syndrome, acute on chronic CKD, pneumonia, and pleural effusion.  I spoke with patient at length regarding her results including worsening renal function with associated hyperkalemia and likely symptomatic anemia.  Patient also shows signs of volume overload.  Patient is not hypoxic at this time without any acute respiratory distress on exam.  Patient expressed significant concerns about being admitted to the hospital given 2 previous admissions but have reportedly had adverse medication events.  Patient requested transfer to [facility].  I told patient that I would attempt via phone call to transfer to [facility] however we needed to start treatment here.  Patient refused and states that she would like to get a cab to [facility] emergency department as well as receive recommendations as to what we have found and what needs to be done.  This patient has elected to leave against medical advice. In my opinion, the patient has capacity to leave AMA. The patient is clinically sober, free from distracting injury, appears to have intact insight and judgment and reason, and in my opinion has capacity to make decisions. I explained to the patient that his symptoms may represent end-stage renal disease requiring dialysis and life-threatening hyperkalemia and the patient verbalized understanding of my concerns.  I informed the patient that the next step in diagnosis and treatment would be nephrology consult, transfusion, and they verbalized understanding of this as well. I explained the risks of leaving without further workup or treatment, which included reasonably foreseeable complications such as death, serious injury, permanent disability, and  permanent kidney damage. I also offered alternatives to departing AMA such as assigning the patient a different provider or an alternate workup pathway.  The patient is refusing any further care, specifically transfusion, admission, and nephrology evaluation, and is leaving against medical advice. I am unable to convince the patient to stay. I have asked them to return as soon as possible to complete their evaluation, and also explained that they were welcome to return to the ER for further evaluation whenever they choose. I have asked the patient to follow up with their primary doctor as soon as possible. I have answered all their questions. Patient did not sign AMA paperwork.  Dispo: AMA   FINAL CLINICAL IMPRESSION(S) / ED DIAGNOSES   Final diagnoses:  Symptomatic anemia  Hyperkalemia  Bilateral lower  extremity edema  Acute renal failure superimposed on chronic kidney disease, unspecified acute renal failure type, unspecified CKD stage (HCC)   Rx / DC Orders   ED Discharge Orders     None      Note:  This document was prepared using Dragon voice recognition software and may include unintentional dictation errors.   Merwyn Katos, MD 11/16/23 1728

## 2023-11-16 NOTE — ED Triage Notes (Signed)
Pt to ED via [facility] from home. Pt reports SOB and lower extremity edema since yesterday evening. Pt reports hx of CKD. Pt denies CHF. Pt denies CP, HA, N/V/D.

## 2023-11-16 NOTE — ED Notes (Signed)
Pt reports sob and swelling of legs.   No chest pain.   No cough or fever.  Pt reports feeling bad for several days.   Md at bedside.  Pt alert   speech clear.

## 2023-12-31 DIAGNOSIS — N185 Chronic kidney disease, stage 5: Secondary | ICD-10-CM | POA: Diagnosis not present

## 2023-12-31 DIAGNOSIS — E1122 Type 2 diabetes mellitus with diabetic chronic kidney disease: Secondary | ICD-10-CM | POA: Diagnosis not present

## 2023-12-31 DIAGNOSIS — N2581 Secondary hyperparathyroidism of renal origin: Secondary | ICD-10-CM | POA: Diagnosis not present

## 2023-12-31 DIAGNOSIS — I120 Hypertensive chronic kidney disease with stage 5 chronic kidney disease or end stage renal disease: Secondary | ICD-10-CM | POA: Diagnosis not present

## 2023-12-31 DIAGNOSIS — D631 Anemia in chronic kidney disease: Secondary | ICD-10-CM | POA: Diagnosis not present

## 2023-12-31 DIAGNOSIS — R809 Proteinuria, unspecified: Secondary | ICD-10-CM | POA: Diagnosis not present

## 2024-02-01 DIAGNOSIS — E1122 Type 2 diabetes mellitus with diabetic chronic kidney disease: Secondary | ICD-10-CM | POA: Diagnosis not present

## 2024-02-01 DIAGNOSIS — N2581 Secondary hyperparathyroidism of renal origin: Secondary | ICD-10-CM | POA: Diagnosis not present

## 2024-02-01 DIAGNOSIS — I120 Hypertensive chronic kidney disease with stage 5 chronic kidney disease or end stage renal disease: Secondary | ICD-10-CM | POA: Diagnosis not present

## 2024-02-01 DIAGNOSIS — N185 Chronic kidney disease, stage 5: Secondary | ICD-10-CM | POA: Diagnosis not present

## 2024-02-01 DIAGNOSIS — D631 Anemia in chronic kidney disease: Secondary | ICD-10-CM | POA: Diagnosis not present

## 2024-02-01 DIAGNOSIS — R809 Proteinuria, unspecified: Secondary | ICD-10-CM | POA: Diagnosis not present

## 2024-03-02 DIAGNOSIS — I120 Hypertensive chronic kidney disease with stage 5 chronic kidney disease or end stage renal disease: Secondary | ICD-10-CM | POA: Diagnosis not present

## 2024-03-02 DIAGNOSIS — D631 Anemia in chronic kidney disease: Secondary | ICD-10-CM | POA: Diagnosis not present

## 2024-03-02 DIAGNOSIS — N185 Chronic kidney disease, stage 5: Secondary | ICD-10-CM | POA: Diagnosis not present

## 2024-03-02 DIAGNOSIS — E1122 Type 2 diabetes mellitus with diabetic chronic kidney disease: Secondary | ICD-10-CM | POA: Diagnosis not present

## 2024-03-02 DIAGNOSIS — N2581 Secondary hyperparathyroidism of renal origin: Secondary | ICD-10-CM | POA: Diagnosis not present

## 2024-03-02 DIAGNOSIS — R809 Proteinuria, unspecified: Secondary | ICD-10-CM | POA: Diagnosis not present

## 2024-03-07 DIAGNOSIS — I120 Hypertensive chronic kidney disease with stage 5 chronic kidney disease or end stage renal disease: Secondary | ICD-10-CM | POA: Diagnosis not present

## 2024-03-07 DIAGNOSIS — E1122 Type 2 diabetes mellitus with diabetic chronic kidney disease: Secondary | ICD-10-CM | POA: Diagnosis not present

## 2024-03-07 DIAGNOSIS — E872 Acidosis, unspecified: Secondary | ICD-10-CM | POA: Diagnosis not present

## 2024-03-07 DIAGNOSIS — E875 Hyperkalemia: Secondary | ICD-10-CM | POA: Diagnosis not present

## 2024-03-07 DIAGNOSIS — N185 Chronic kidney disease, stage 5: Secondary | ICD-10-CM | POA: Diagnosis not present

## 2024-03-07 DIAGNOSIS — I1311 Hypertensive heart and chronic kidney disease without heart failure, with stage 5 chronic kidney disease, or end stage renal disease: Secondary | ICD-10-CM | POA: Diagnosis not present

## 2024-03-07 DIAGNOSIS — R140 Abdominal distension (gaseous): Secondary | ICD-10-CM | POA: Diagnosis not present

## 2024-03-07 DIAGNOSIS — D631 Anemia in chronic kidney disease: Secondary | ICD-10-CM | POA: Diagnosis not present

## 2024-03-07 DIAGNOSIS — R9431 Abnormal electrocardiogram [ECG] [EKG]: Secondary | ICD-10-CM | POA: Diagnosis not present

## 2024-03-07 DIAGNOSIS — R918 Other nonspecific abnormal finding of lung field: Secondary | ICD-10-CM | POA: Diagnosis not present

## 2024-03-07 DIAGNOSIS — I517 Cardiomegaly: Secondary | ICD-10-CM | POA: Diagnosis not present

## 2024-03-07 DIAGNOSIS — E860 Dehydration: Secondary | ICD-10-CM | POA: Diagnosis not present

## 2024-03-16 DIAGNOSIS — N2581 Secondary hyperparathyroidism of renal origin: Secondary | ICD-10-CM | POA: Diagnosis not present

## 2024-03-16 DIAGNOSIS — N185 Chronic kidney disease, stage 5: Secondary | ICD-10-CM | POA: Diagnosis not present

## 2024-03-16 DIAGNOSIS — D631 Anemia in chronic kidney disease: Secondary | ICD-10-CM | POA: Diagnosis not present

## 2024-03-16 DIAGNOSIS — R809 Proteinuria, unspecified: Secondary | ICD-10-CM | POA: Diagnosis not present

## 2024-03-16 DIAGNOSIS — I120 Hypertensive chronic kidney disease with stage 5 chronic kidney disease or end stage renal disease: Secondary | ICD-10-CM | POA: Diagnosis not present

## 2024-03-16 DIAGNOSIS — E1122 Type 2 diabetes mellitus with diabetic chronic kidney disease: Secondary | ICD-10-CM | POA: Diagnosis not present

## 2024-03-22 DIAGNOSIS — Z794 Long term (current) use of insulin: Secondary | ICD-10-CM | POA: Diagnosis not present

## 2024-03-22 DIAGNOSIS — N184 Chronic kidney disease, stage 4 (severe): Secondary | ICD-10-CM | POA: Diagnosis not present

## 2024-03-22 DIAGNOSIS — I739 Peripheral vascular disease, unspecified: Secondary | ICD-10-CM | POA: Diagnosis not present

## 2024-03-22 DIAGNOSIS — I5032 Chronic diastolic (congestive) heart failure: Secondary | ICD-10-CM | POA: Diagnosis not present

## 2024-03-22 DIAGNOSIS — I10 Essential (primary) hypertension: Secondary | ICD-10-CM | POA: Diagnosis not present

## 2024-03-22 DIAGNOSIS — N179 Acute kidney failure, unspecified: Secondary | ICD-10-CM | POA: Diagnosis not present

## 2024-03-22 DIAGNOSIS — I129 Hypertensive chronic kidney disease with stage 1 through stage 4 chronic kidney disease, or unspecified chronic kidney disease: Secondary | ICD-10-CM | POA: Diagnosis not present

## 2024-03-22 DIAGNOSIS — E1122 Type 2 diabetes mellitus with diabetic chronic kidney disease: Secondary | ICD-10-CM | POA: Diagnosis not present

## 2024-03-22 DIAGNOSIS — N200 Calculus of kidney: Secondary | ICD-10-CM | POA: Diagnosis not present

## 2024-03-22 DIAGNOSIS — D631 Anemia in chronic kidney disease: Secondary | ICD-10-CM | POA: Diagnosis not present

## 2024-03-22 DIAGNOSIS — I120 Hypertensive chronic kidney disease with stage 5 chronic kidney disease or end stage renal disease: Secondary | ICD-10-CM | POA: Diagnosis not present

## 2024-03-22 DIAGNOSIS — R809 Proteinuria, unspecified: Secondary | ICD-10-CM | POA: Diagnosis not present

## 2024-03-22 DIAGNOSIS — N186 End stage renal disease: Secondary | ICD-10-CM | POA: Diagnosis not present

## 2024-03-22 DIAGNOSIS — R269 Unspecified abnormalities of gait and mobility: Secondary | ICD-10-CM | POA: Diagnosis not present

## 2024-03-22 DIAGNOSIS — N2581 Secondary hyperparathyroidism of renal origin: Secondary | ICD-10-CM | POA: Diagnosis not present

## 2024-03-22 DIAGNOSIS — R801 Persistent proteinuria, unspecified: Secondary | ICD-10-CM | POA: Diagnosis not present

## 2024-03-22 DIAGNOSIS — N185 Chronic kidney disease, stage 5: Secondary | ICD-10-CM | POA: Diagnosis not present

## 2024-03-22 DIAGNOSIS — N189 Chronic kidney disease, unspecified: Secondary | ICD-10-CM | POA: Diagnosis not present

## 2024-03-22 DIAGNOSIS — E1121 Type 2 diabetes mellitus with diabetic nephropathy: Secondary | ICD-10-CM | POA: Diagnosis not present

## 2024-04-07 DIAGNOSIS — N189 Chronic kidney disease, unspecified: Secondary | ICD-10-CM | POA: Diagnosis not present

## 2024-04-07 DIAGNOSIS — Z4901 Encounter for fitting and adjustment of extracorporeal dialysis catheter: Secondary | ICD-10-CM | POA: Diagnosis not present

## 2024-04-07 DIAGNOSIS — N185 Chronic kidney disease, stage 5: Secondary | ICD-10-CM | POA: Diagnosis not present

## 2024-04-07 DIAGNOSIS — Z862 Personal history of diseases of the blood and blood-forming organs and certain disorders involving the immune mechanism: Secondary | ICD-10-CM | POA: Diagnosis not present

## 2024-04-14 DIAGNOSIS — E1122 Type 2 diabetes mellitus with diabetic chronic kidney disease: Secondary | ICD-10-CM | POA: Diagnosis not present

## 2024-04-14 DIAGNOSIS — R809 Proteinuria, unspecified: Secondary | ICD-10-CM | POA: Diagnosis not present

## 2024-04-14 DIAGNOSIS — N2581 Secondary hyperparathyroidism of renal origin: Secondary | ICD-10-CM | POA: Diagnosis not present

## 2024-04-14 DIAGNOSIS — D631 Anemia in chronic kidney disease: Secondary | ICD-10-CM | POA: Diagnosis not present

## 2024-04-14 DIAGNOSIS — I120 Hypertensive chronic kidney disease with stage 5 chronic kidney disease or end stage renal disease: Secondary | ICD-10-CM | POA: Diagnosis not present

## 2024-04-14 DIAGNOSIS — N185 Chronic kidney disease, stage 5: Secondary | ICD-10-CM | POA: Diagnosis not present

## 2024-04-14 DIAGNOSIS — E875 Hyperkalemia: Secondary | ICD-10-CM | POA: Diagnosis not present

## 2024-04-21 ENCOUNTER — Telehealth (INDEPENDENT_AMBULATORY_CARE_PROVIDER_SITE_OTHER): Payer: Self-pay

## 2024-04-21 ENCOUNTER — Encounter: Payer: Self-pay | Admitting: Internal Medicine

## 2024-04-21 NOTE — Telephone Encounter (Signed)
 Spoke with the patient and she is scheduled with Dr. Vonna Guardian for a permcath insertion on 05/02/24 with a  9:00 am arrival time to the [facility]. Pre-procedure instructions were discussed and will be mailed.

## 2024-04-22 ENCOUNTER — Encounter: Payer: Self-pay | Admitting: Internal Medicine

## 2024-04-22 DIAGNOSIS — N185 Chronic kidney disease, stage 5: Secondary | ICD-10-CM | POA: Diagnosis not present

## 2024-04-22 DIAGNOSIS — I120 Hypertensive chronic kidney disease with stage 5 chronic kidney disease or end stage renal disease: Secondary | ICD-10-CM | POA: Diagnosis not present

## 2024-04-22 DIAGNOSIS — R5383 Other fatigue: Secondary | ICD-10-CM | POA: Diagnosis not present

## 2024-04-22 DIAGNOSIS — Z8639 Personal history of other endocrine, nutritional and metabolic disease: Secondary | ICD-10-CM | POA: Diagnosis not present

## 2024-04-22 DIAGNOSIS — I272 Pulmonary hypertension, unspecified: Secondary | ICD-10-CM | POA: Diagnosis not present

## 2024-04-22 DIAGNOSIS — I10 Essential (primary) hypertension: Secondary | ICD-10-CM | POA: Diagnosis not present

## 2024-04-22 DIAGNOSIS — D631 Anemia in chronic kidney disease: Secondary | ICD-10-CM | POA: Diagnosis not present

## 2024-04-22 DIAGNOSIS — Z4901 Encounter for fitting and adjustment of extracorporeal dialysis catheter: Secondary | ICD-10-CM | POA: Diagnosis not present

## 2024-04-22 DIAGNOSIS — E46 Unspecified protein-calorie malnutrition: Secondary | ICD-10-CM | POA: Diagnosis not present

## 2024-04-22 DIAGNOSIS — E872 Acidosis, unspecified: Secondary | ICD-10-CM | POA: Diagnosis not present

## 2024-04-22 DIAGNOSIS — I517 Cardiomegaly: Secondary | ICD-10-CM | POA: Diagnosis not present

## 2024-04-22 DIAGNOSIS — M899 Disorder of bone, unspecified: Secondary | ICD-10-CM | POA: Diagnosis not present

## 2024-04-22 DIAGNOSIS — E8770 Fluid overload, unspecified: Secondary | ICD-10-CM | POA: Diagnosis not present

## 2024-04-22 DIAGNOSIS — E875 Hyperkalemia: Secondary | ICD-10-CM | POA: Diagnosis not present

## 2024-04-22 DIAGNOSIS — N186 End stage renal disease: Secondary | ICD-10-CM | POA: Diagnosis not present

## 2024-04-23 DIAGNOSIS — Z79899 Other long term (current) drug therapy: Secondary | ICD-10-CM | POA: Diagnosis not present

## 2024-04-23 DIAGNOSIS — I5032 Chronic diastolic (congestive) heart failure: Secondary | ICD-10-CM | POA: Diagnosis not present

## 2024-04-23 DIAGNOSIS — E1122 Type 2 diabetes mellitus with diabetic chronic kidney disease: Secondary | ICD-10-CM | POA: Diagnosis not present

## 2024-04-23 DIAGNOSIS — I132 Hypertensive heart and chronic kidney disease with heart failure and with stage 5 chronic kidney disease, or end stage renal disease: Secondary | ICD-10-CM | POA: Diagnosis not present

## 2024-04-23 DIAGNOSIS — N186 End stage renal disease: Secondary | ICD-10-CM | POA: Diagnosis not present

## 2024-04-23 DIAGNOSIS — Z4901 Encounter for fitting and adjustment of extracorporeal dialysis catheter: Secondary | ICD-10-CM | POA: Diagnosis not present

## 2024-04-23 DIAGNOSIS — E440 Moderate protein-calorie malnutrition: Secondary | ICD-10-CM | POA: Diagnosis not present

## 2024-04-23 DIAGNOSIS — D631 Anemia in chronic kidney disease: Secondary | ICD-10-CM | POA: Diagnosis not present

## 2024-04-23 DIAGNOSIS — Z881 Allergy status to other antibiotic agents status: Secondary | ICD-10-CM | POA: Diagnosis not present

## 2024-04-23 DIAGNOSIS — N185 Chronic kidney disease, stage 5: Secondary | ICD-10-CM | POA: Diagnosis not present

## 2024-04-23 DIAGNOSIS — Z91018 Allergy to other foods: Secondary | ICD-10-CM | POA: Diagnosis not present

## 2024-04-23 DIAGNOSIS — I2722 Pulmonary hypertension due to left heart disease: Secondary | ICD-10-CM | POA: Diagnosis not present

## 2024-04-23 DIAGNOSIS — R079 Chest pain, unspecified: Secondary | ICD-10-CM | POA: Diagnosis not present

## 2024-04-23 DIAGNOSIS — E875 Hyperkalemia: Secondary | ICD-10-CM | POA: Diagnosis not present

## 2024-04-23 DIAGNOSIS — Z886 Allergy status to analgesic agent status: Secondary | ICD-10-CM | POA: Diagnosis not present

## 2024-04-23 DIAGNOSIS — Z992 Dependence on renal dialysis: Secondary | ICD-10-CM | POA: Diagnosis not present

## 2024-04-23 DIAGNOSIS — M899 Disorder of bone, unspecified: Secondary | ICD-10-CM | POA: Diagnosis not present

## 2024-04-23 DIAGNOSIS — G5621 Lesion of ulnar nerve, right upper limb: Secondary | ICD-10-CM | POA: Diagnosis not present

## 2024-04-23 DIAGNOSIS — R918 Other nonspecific abnormal finding of lung field: Secondary | ICD-10-CM | POA: Diagnosis not present

## 2024-04-23 DIAGNOSIS — M898X9 Other specified disorders of bone, unspecified site: Secondary | ICD-10-CM | POA: Diagnosis not present

## 2024-04-23 DIAGNOSIS — R931 Abnormal findings on diagnostic imaging of heart and coronary circulation: Secondary | ICD-10-CM | POA: Diagnosis not present

## 2024-04-23 DIAGNOSIS — I517 Cardiomegaly: Secondary | ICD-10-CM | POA: Diagnosis not present

## 2024-04-23 DIAGNOSIS — I120 Hypertensive chronic kidney disease with stage 5 chronic kidney disease or end stage renal disease: Secondary | ICD-10-CM | POA: Diagnosis not present

## 2024-04-23 DIAGNOSIS — R011 Cardiac murmur, unspecified: Secondary | ICD-10-CM | POA: Diagnosis not present

## 2024-04-23 DIAGNOSIS — I959 Hypotension, unspecified: Secondary | ICD-10-CM | POA: Diagnosis not present

## 2024-04-23 DIAGNOSIS — E872 Acidosis, unspecified: Secondary | ICD-10-CM | POA: Diagnosis not present

## 2024-04-23 DIAGNOSIS — Z6824 Body mass index (BMI) 24.0-24.9, adult: Secondary | ICD-10-CM | POA: Diagnosis not present

## 2024-04-25 DIAGNOSIS — R011 Cardiac murmur, unspecified: Secondary | ICD-10-CM | POA: Diagnosis not present

## 2024-04-25 DIAGNOSIS — Z4901 Encounter for fitting and adjustment of extracorporeal dialysis catheter: Secondary | ICD-10-CM | POA: Diagnosis not present

## 2024-04-25 DIAGNOSIS — N185 Chronic kidney disease, stage 5: Secondary | ICD-10-CM | POA: Diagnosis not present

## 2024-04-26 ENCOUNTER — Encounter: Payer: Self-pay | Admitting: Internal Medicine

## 2024-04-28 ENCOUNTER — Encounter: Payer: Self-pay | Admitting: Internal Medicine

## 2024-04-29 ENCOUNTER — Encounter: Payer: Self-pay | Admitting: Internal Medicine

## 2024-05-02 ENCOUNTER — Ambulatory Visit: Admission: RE | Admit: 2024-05-02 | Payer: Self-pay | Source: Home / Self Care | Admitting: Vascular Surgery

## 2024-05-02 ENCOUNTER — Encounter: Admission: RE | Payer: Self-pay | Source: Home / Self Care

## 2024-05-02 DIAGNOSIS — N185 Chronic kidney disease, stage 5: Secondary | ICD-10-CM

## 2024-05-02 SURGERY — DIALYSIS/PERMA CATHETER INSERTION
Anesthesia: Moderate Sedation

## 2024-05-03 DIAGNOSIS — D631 Anemia in chronic kidney disease: Secondary | ICD-10-CM | POA: Diagnosis not present

## 2024-05-03 DIAGNOSIS — D509 Iron deficiency anemia, unspecified: Secondary | ICD-10-CM | POA: Diagnosis not present

## 2024-05-03 DIAGNOSIS — Z992 Dependence on renal dialysis: Secondary | ICD-10-CM | POA: Diagnosis not present

## 2024-05-03 DIAGNOSIS — N186 End stage renal disease: Secondary | ICD-10-CM | POA: Diagnosis not present

## 2024-05-05 ENCOUNTER — Telehealth (INDEPENDENT_AMBULATORY_CARE_PROVIDER_SITE_OTHER): Payer: Self-pay

## 2024-05-05 NOTE — Telephone Encounter (Signed)
 Spoke with the patient's spouse attempting to reschedule her for a permcath insertion. The spouse told me she was at dialysis and she would call later.

## 2024-05-09 DIAGNOSIS — E1121 Type 2 diabetes mellitus with diabetic nephropathy: Secondary | ICD-10-CM | POA: Diagnosis not present

## 2024-05-09 DIAGNOSIS — D631 Anemia in chronic kidney disease: Secondary | ICD-10-CM | POA: Diagnosis not present

## 2024-05-09 DIAGNOSIS — Z794 Long term (current) use of insulin: Secondary | ICD-10-CM | POA: Diagnosis not present

## 2024-05-09 DIAGNOSIS — I10 Essential (primary) hypertension: Secondary | ICD-10-CM | POA: Diagnosis not present

## 2024-05-09 DIAGNOSIS — N189 Chronic kidney disease, unspecified: Secondary | ICD-10-CM | POA: Diagnosis not present

## 2024-05-09 DIAGNOSIS — N186 End stage renal disease: Secondary | ICD-10-CM | POA: Diagnosis not present

## 2024-05-09 DIAGNOSIS — Z09 Encounter for follow-up examination after completed treatment for conditions other than malignant neoplasm: Secondary | ICD-10-CM | POA: Diagnosis not present

## 2024-05-11 ENCOUNTER — Telehealth (INDEPENDENT_AMBULATORY_CARE_PROVIDER_SITE_OTHER): Payer: Self-pay

## 2024-05-11 NOTE — Telephone Encounter (Signed)
 I contacted the patient to get her scheduled for a permcath insertion. Patient stated that she went to [facility] and her care and any surgeries will be with [facility] and she will not come to a [facility] facility.

## 2024-05-18 ENCOUNTER — Other Ambulatory Visit: Payer: Self-pay | Admitting: *Deleted

## 2024-05-18 DIAGNOSIS — D649 Anemia, unspecified: Secondary | ICD-10-CM

## 2024-05-19 ENCOUNTER — Inpatient Hospital Stay: Admitting: Nurse Practitioner

## 2024-05-19 ENCOUNTER — Inpatient Hospital Stay: Attending: Internal Medicine

## 2024-05-19 ENCOUNTER — Encounter: Payer: Self-pay | Admitting: Nurse Practitioner

## 2024-05-21 DIAGNOSIS — N186 End stage renal disease: Secondary | ICD-10-CM | POA: Diagnosis not present

## 2024-05-21 DIAGNOSIS — Z992 Dependence on renal dialysis: Secondary | ICD-10-CM | POA: Diagnosis not present

## 2024-05-24 DIAGNOSIS — Z992 Dependence on renal dialysis: Secondary | ICD-10-CM | POA: Diagnosis not present

## 2024-05-24 DIAGNOSIS — N186 End stage renal disease: Secondary | ICD-10-CM | POA: Diagnosis not present

## 2024-05-24 DIAGNOSIS — D631 Anemia in chronic kidney disease: Secondary | ICD-10-CM | POA: Diagnosis not present

## 2024-05-24 DIAGNOSIS — D509 Iron deficiency anemia, unspecified: Secondary | ICD-10-CM | POA: Diagnosis not present

## 2024-06-01 ENCOUNTER — Telehealth: Payer: Self-pay | Admitting: Internal Medicine

## 2024-06-01 NOTE — Telephone Encounter (Signed)
 Received a new referral for this patient. Called to get her scheduled and patient states she is having Dialysis and will not need to come for appointment.   Consulted with Dr. Geraldene Kleine-  his response below:  Reviewed the chart looks like patient is getting dialysis-patient will not need any follow-ups here. Thanks for checking   Patient states she will let Dr. Primus Brookes know she is getting dialysis and they are taking care of her there.

## 2024-06-06 DIAGNOSIS — I129 Hypertensive chronic kidney disease with stage 1 through stage 4 chronic kidney disease, or unspecified chronic kidney disease: Secondary | ICD-10-CM | POA: Diagnosis not present

## 2024-06-06 DIAGNOSIS — E785 Hyperlipidemia, unspecified: Secondary | ICD-10-CM | POA: Diagnosis not present

## 2024-06-06 DIAGNOSIS — I10 Essential (primary) hypertension: Secondary | ICD-10-CM | POA: Diagnosis not present

## 2024-06-06 DIAGNOSIS — I160 Hypertensive urgency: Secondary | ICD-10-CM | POA: Diagnosis not present

## 2024-06-06 DIAGNOSIS — N1832 Chronic kidney disease, stage 3b: Secondary | ICD-10-CM | POA: Diagnosis not present

## 2024-06-06 DIAGNOSIS — R531 Weakness: Secondary | ICD-10-CM | POA: Diagnosis not present

## 2024-06-06 DIAGNOSIS — E663 Overweight: Secondary | ICD-10-CM | POA: Diagnosis not present

## 2024-06-06 DIAGNOSIS — I739 Peripheral vascular disease, unspecified: Secondary | ICD-10-CM | POA: Diagnosis not present

## 2024-06-06 DIAGNOSIS — Z794 Long term (current) use of insulin: Secondary | ICD-10-CM | POA: Diagnosis not present

## 2024-06-06 DIAGNOSIS — E1121 Type 2 diabetes mellitus with diabetic nephropathy: Secondary | ICD-10-CM | POA: Diagnosis not present

## 2024-06-06 DIAGNOSIS — I5030 Unspecified diastolic (congestive) heart failure: Secondary | ICD-10-CM | POA: Diagnosis not present

## 2024-06-14 DIAGNOSIS — N186 End stage renal disease: Secondary | ICD-10-CM | POA: Diagnosis not present

## 2024-06-14 DIAGNOSIS — Z992 Dependence on renal dialysis: Secondary | ICD-10-CM | POA: Diagnosis not present

## 2024-06-21 DIAGNOSIS — D631 Anemia in chronic kidney disease: Secondary | ICD-10-CM | POA: Diagnosis not present

## 2024-06-21 DIAGNOSIS — T82898A Other specified complication of vascular prosthetic devices, implants and grafts, initial encounter: Secondary | ICD-10-CM | POA: Diagnosis not present

## 2024-06-21 DIAGNOSIS — Z992 Dependence on renal dialysis: Secondary | ICD-10-CM | POA: Diagnosis not present

## 2024-06-21 DIAGNOSIS — N186 End stage renal disease: Secondary | ICD-10-CM | POA: Diagnosis not present

## 2024-06-21 DIAGNOSIS — D509 Iron deficiency anemia, unspecified: Secondary | ICD-10-CM | POA: Diagnosis not present

## 2024-07-21 DIAGNOSIS — N186 End stage renal disease: Secondary | ICD-10-CM | POA: Diagnosis not present

## 2024-07-21 DIAGNOSIS — R0602 Shortness of breath: Secondary | ICD-10-CM | POA: Diagnosis not present

## 2024-07-21 DIAGNOSIS — Z992 Dependence on renal dialysis: Secondary | ICD-10-CM | POA: Diagnosis not present

## 2024-07-21 DIAGNOSIS — I519 Heart disease, unspecified: Secondary | ICD-10-CM | POA: Diagnosis not present

## 2024-07-22 DIAGNOSIS — N186 End stage renal disease: Secondary | ICD-10-CM | POA: Diagnosis not present

## 2024-07-22 DIAGNOSIS — Z992 Dependence on renal dialysis: Secondary | ICD-10-CM | POA: Diagnosis not present

## 2024-07-22 DIAGNOSIS — Z23 Encounter for immunization: Secondary | ICD-10-CM | POA: Diagnosis not present

## 2024-07-22 DIAGNOSIS — D509 Iron deficiency anemia, unspecified: Secondary | ICD-10-CM | POA: Diagnosis not present

## 2024-07-22 DIAGNOSIS — D631 Anemia in chronic kidney disease: Secondary | ICD-10-CM | POA: Diagnosis not present

## 2024-08-09 DIAGNOSIS — R931 Abnormal findings on diagnostic imaging of heart and coronary circulation: Secondary | ICD-10-CM | POA: Diagnosis not present

## 2024-08-09 DIAGNOSIS — Z992 Dependence on renal dialysis: Secondary | ICD-10-CM | POA: Diagnosis not present

## 2024-08-09 DIAGNOSIS — T8241XA Breakdown (mechanical) of vascular dialysis catheter, initial encounter: Secondary | ICD-10-CM | POA: Diagnosis not present

## 2024-08-09 DIAGNOSIS — J811 Chronic pulmonary edema: Secondary | ICD-10-CM | POA: Diagnosis not present

## 2024-08-09 DIAGNOSIS — R42 Dizziness and giddiness: Secondary | ICD-10-CM | POA: Diagnosis not present

## 2024-08-09 DIAGNOSIS — I132 Hypertensive heart and chronic kidney disease with heart failure and with stage 5 chronic kidney disease, or end stage renal disease: Secondary | ICD-10-CM | POA: Diagnosis not present

## 2024-08-09 DIAGNOSIS — Z79899 Other long term (current) drug therapy: Secondary | ICD-10-CM | POA: Diagnosis not present

## 2024-08-09 DIAGNOSIS — I5030 Unspecified diastolic (congestive) heart failure: Secondary | ICD-10-CM | POA: Diagnosis not present

## 2024-08-09 DIAGNOSIS — R918 Other nonspecific abnormal finding of lung field: Secondary | ICD-10-CM | POA: Diagnosis not present

## 2024-08-09 DIAGNOSIS — T82898A Other specified complication of vascular prosthetic devices, implants and grafts, initial encounter: Secondary | ICD-10-CM | POA: Diagnosis not present

## 2024-08-09 DIAGNOSIS — N186 End stage renal disease: Secondary | ICD-10-CM | POA: Diagnosis not present

## 2024-08-09 DIAGNOSIS — E875 Hyperkalemia: Secondary | ICD-10-CM | POA: Diagnosis not present

## 2024-08-09 DIAGNOSIS — E1022 Type 1 diabetes mellitus with diabetic chronic kidney disease: Secondary | ICD-10-CM | POA: Diagnosis not present

## 2024-08-09 DIAGNOSIS — D631 Anemia in chronic kidney disease: Secondary | ICD-10-CM | POA: Diagnosis not present

## 2024-08-09 DIAGNOSIS — E872 Acidosis, unspecified: Secondary | ICD-10-CM | POA: Diagnosis not present

## 2024-08-10 DIAGNOSIS — N186 End stage renal disease: Secondary | ICD-10-CM | POA: Diagnosis not present

## 2024-08-10 DIAGNOSIS — I120 Hypertensive chronic kidney disease with stage 5 chronic kidney disease or end stage renal disease: Secondary | ICD-10-CM | POA: Diagnosis not present

## 2024-08-10 DIAGNOSIS — T829XXA Unspecified complication of cardiac and vascular prosthetic device, implant and graft, initial encounter: Secondary | ICD-10-CM | POA: Diagnosis not present

## 2024-08-10 DIAGNOSIS — R42 Dizziness and giddiness: Secondary | ICD-10-CM | POA: Diagnosis not present

## 2024-08-10 DIAGNOSIS — Z992 Dependence on renal dialysis: Secondary | ICD-10-CM | POA: Diagnosis not present

## 2024-08-22 DIAGNOSIS — Z992 Dependence on renal dialysis: Secondary | ICD-10-CM | POA: Diagnosis not present

## 2024-08-22 DIAGNOSIS — N186 End stage renal disease: Secondary | ICD-10-CM | POA: Diagnosis not present

## 2024-08-22 DIAGNOSIS — D509 Iron deficiency anemia, unspecified: Secondary | ICD-10-CM | POA: Diagnosis not present

## 2024-08-22 DIAGNOSIS — D631 Anemia in chronic kidney disease: Secondary | ICD-10-CM | POA: Diagnosis not present

## 2024-09-13 DIAGNOSIS — N186 End stage renal disease: Secondary | ICD-10-CM | POA: Diagnosis not present

## 2024-09-13 DIAGNOSIS — Z992 Dependence on renal dialysis: Secondary | ICD-10-CM | POA: Diagnosis not present

## 2024-09-13 DIAGNOSIS — D509 Iron deficiency anemia, unspecified: Secondary | ICD-10-CM | POA: Diagnosis not present

## 2024-09-13 DIAGNOSIS — D631 Anemia in chronic kidney disease: Secondary | ICD-10-CM | POA: Diagnosis not present

## 2024-09-20 DIAGNOSIS — M10342 Gout due to renal impairment, left hand: Secondary | ICD-10-CM | POA: Diagnosis not present

## 2024-09-20 DIAGNOSIS — I10 Essential (primary) hypertension: Secondary | ICD-10-CM | POA: Diagnosis not present

## 2024-09-20 DIAGNOSIS — M7989 Other specified soft tissue disorders: Secondary | ICD-10-CM | POA: Diagnosis not present
